# Patient Record
Sex: Female | Born: 1940 | Race: White | Hispanic: No | Smoking: Never smoker
Health system: Southern US, Community
[De-identification: ages and names within clinical notes are randomized; demographics above are authoritative.]

## PROBLEM LIST (undated history)

## (undated) DIAGNOSIS — K219 Gastro-esophageal reflux disease without esophagitis: Secondary | ICD-10-CM

## (undated) DIAGNOSIS — R739 Hyperglycemia, unspecified: Secondary | ICD-10-CM

## (undated) DIAGNOSIS — M810 Age-related osteoporosis without current pathological fracture: Secondary | ICD-10-CM

## (undated) DIAGNOSIS — M1990 Unspecified osteoarthritis, unspecified site: Secondary | ICD-10-CM

## (undated) DIAGNOSIS — I10 Essential (primary) hypertension: Secondary | ICD-10-CM

## (undated) DIAGNOSIS — R0600 Dyspnea, unspecified: Secondary | ICD-10-CM

## (undated) DIAGNOSIS — G4700 Insomnia, unspecified: Secondary | ICD-10-CM

## (undated) DIAGNOSIS — T7840XA Allergy, unspecified, initial encounter: Secondary | ICD-10-CM

## (undated) DIAGNOSIS — E876 Hypokalemia: Secondary | ICD-10-CM

## (undated) DIAGNOSIS — D649 Anemia, unspecified: Secondary | ICD-10-CM

## (undated) DIAGNOSIS — I493 Ventricular premature depolarization: Secondary | ICD-10-CM

## (undated) DIAGNOSIS — R011 Cardiac murmur, unspecified: Secondary | ICD-10-CM

## (undated) HISTORY — DX: Age-related osteoporosis without current pathological fracture: M81.0

## (undated) HISTORY — DX: Anemia, unspecified: D64.9

## (undated) HISTORY — DX: Ventricular premature depolarization: I49.3

## (undated) HISTORY — DX: Allergy, unspecified, initial encounter: T78.40XA

## (undated) HISTORY — DX: Hypomagnesemia: E83.42

## (undated) HISTORY — DX: Insomnia, unspecified: G47.00

## (undated) HISTORY — DX: Essential (primary) hypertension: I10

## (undated) HISTORY — DX: Cardiac murmur, unspecified: R01.1

## (undated) HISTORY — PX: BACK SURGERY: SHX140

## (undated) HISTORY — DX: Gastro-esophageal reflux disease without esophagitis: K21.9

## (undated) HISTORY — DX: Hypokalemia: E87.6

## (undated) HISTORY — DX: Hyperglycemia, unspecified: R73.9

## (undated) HISTORY — PX: EYE SURGERY: SHX253

---

## 2000-03-24 ENCOUNTER — Other Ambulatory Visit: Admission: RE | Admit: 2000-03-24 | Discharge: 2000-03-24 | Payer: Self-pay | Admitting: Family Medicine

## 2000-04-20 ENCOUNTER — Emergency Department (HOSPITAL_COMMUNITY): Admission: EM | Admit: 2000-04-20 | Discharge: 2000-04-20 | Payer: Self-pay | Admitting: Internal Medicine

## 2000-04-20 ENCOUNTER — Encounter: Payer: Self-pay | Admitting: Internal Medicine

## 2000-08-31 ENCOUNTER — Encounter: Payer: Self-pay | Admitting: *Deleted

## 2000-08-31 ENCOUNTER — Observation Stay (HOSPITAL_COMMUNITY): Admission: EM | Admit: 2000-08-31 | Discharge: 2000-09-01 | Payer: Self-pay | Admitting: Emergency Medicine

## 2000-09-01 ENCOUNTER — Encounter: Payer: Self-pay | Admitting: Internal Medicine

## 2000-12-02 ENCOUNTER — Ambulatory Visit (HOSPITAL_COMMUNITY): Admission: RE | Admit: 2000-12-02 | Discharge: 2000-12-02 | Payer: Self-pay | Admitting: Family Medicine

## 2000-12-02 ENCOUNTER — Encounter: Payer: Self-pay | Admitting: Family Medicine

## 2000-12-23 ENCOUNTER — Encounter: Admission: RE | Admit: 2000-12-23 | Discharge: 2000-12-23 | Payer: Self-pay | Admitting: Family Medicine

## 2000-12-23 ENCOUNTER — Encounter: Payer: Self-pay | Admitting: Family Medicine

## 2001-11-09 ENCOUNTER — Other Ambulatory Visit: Admission: RE | Admit: 2001-11-09 | Discharge: 2001-11-09 | Payer: Self-pay | Admitting: Obstetrics and Gynecology

## 2001-11-18 ENCOUNTER — Encounter: Payer: Self-pay | Admitting: Obstetrics and Gynecology

## 2001-11-18 ENCOUNTER — Ambulatory Visit (HOSPITAL_COMMUNITY): Admission: RE | Admit: 2001-11-18 | Discharge: 2001-11-18 | Payer: Self-pay | Admitting: Obstetrics and Gynecology

## 2002-09-17 ENCOUNTER — Ambulatory Visit (HOSPITAL_COMMUNITY): Admission: RE | Admit: 2002-09-17 | Discharge: 2002-09-17 | Payer: Self-pay | Admitting: Gastroenterology

## 2002-11-18 ENCOUNTER — Other Ambulatory Visit: Admission: RE | Admit: 2002-11-18 | Discharge: 2002-11-18 | Payer: Self-pay | Admitting: Obstetrics and Gynecology

## 2002-11-29 ENCOUNTER — Encounter: Payer: Self-pay | Admitting: Obstetrics and Gynecology

## 2002-11-29 ENCOUNTER — Ambulatory Visit (HOSPITAL_COMMUNITY): Admission: RE | Admit: 2002-11-29 | Discharge: 2002-11-29 | Payer: Self-pay | Admitting: Obstetrics and Gynecology

## 2003-12-12 ENCOUNTER — Ambulatory Visit (HOSPITAL_COMMUNITY): Admission: RE | Admit: 2003-12-12 | Discharge: 2003-12-12 | Payer: Self-pay | Admitting: Ophthalmology

## 2004-02-07 ENCOUNTER — Other Ambulatory Visit: Admission: RE | Admit: 2004-02-07 | Discharge: 2004-02-07 | Payer: Self-pay | Admitting: Obstetrics and Gynecology

## 2004-02-08 ENCOUNTER — Ambulatory Visit (HOSPITAL_COMMUNITY): Admission: RE | Admit: 2004-02-08 | Discharge: 2004-02-08 | Payer: Self-pay | Admitting: Obstetrics and Gynecology

## 2004-04-09 ENCOUNTER — Ambulatory Visit (HOSPITAL_COMMUNITY): Admission: RE | Admit: 2004-04-09 | Discharge: 2004-04-09 | Payer: Self-pay | Admitting: Specialist

## 2004-04-10 ENCOUNTER — Ambulatory Visit: Payer: Self-pay | Admitting: Family Medicine

## 2004-04-17 ENCOUNTER — Ambulatory Visit: Payer: Self-pay | Admitting: Family Medicine

## 2004-04-19 ENCOUNTER — Ambulatory Visit: Payer: Self-pay | Admitting: Family Medicine

## 2004-05-07 ENCOUNTER — Ambulatory Visit (HOSPITAL_COMMUNITY): Admission: RE | Admit: 2004-05-07 | Discharge: 2004-05-07 | Payer: Self-pay | Admitting: Specialist

## 2004-06-27 ENCOUNTER — Emergency Department (HOSPITAL_COMMUNITY): Admission: EM | Admit: 2004-06-27 | Discharge: 2004-06-28 | Payer: Self-pay | Admitting: Emergency Medicine

## 2005-02-07 ENCOUNTER — Other Ambulatory Visit: Admission: RE | Admit: 2005-02-07 | Discharge: 2005-02-07 | Payer: Self-pay | Admitting: *Deleted

## 2005-02-08 ENCOUNTER — Ambulatory Visit (HOSPITAL_COMMUNITY): Admission: RE | Admit: 2005-02-08 | Discharge: 2005-02-08 | Payer: Self-pay | Admitting: Family Medicine

## 2005-02-11 ENCOUNTER — Encounter: Payer: Self-pay | Admitting: Family Medicine

## 2005-02-11 LAB — CONVERTED CEMR LAB

## 2005-04-16 ENCOUNTER — Ambulatory Visit: Payer: Self-pay | Admitting: Family Medicine

## 2005-04-23 ENCOUNTER — Ambulatory Visit: Payer: Self-pay | Admitting: Family Medicine

## 2005-06-04 ENCOUNTER — Ambulatory Visit: Payer: Self-pay | Admitting: Family Medicine

## 2005-07-04 ENCOUNTER — Ambulatory Visit: Payer: Self-pay | Admitting: Family Medicine

## 2006-01-09 ENCOUNTER — Ambulatory Visit: Payer: Self-pay | Admitting: Family Medicine

## 2006-01-09 LAB — CONVERTED CEMR LAB: Uric Acid, Serum: 9.1 mg/dL — ABNORMAL HIGH (ref 2.4–7.0)

## 2006-01-14 ENCOUNTER — Ambulatory Visit: Payer: Self-pay | Admitting: Family Medicine

## 2006-03-05 ENCOUNTER — Ambulatory Visit (HOSPITAL_COMMUNITY): Admission: RE | Admit: 2006-03-05 | Discharge: 2006-03-05 | Payer: Self-pay | Admitting: Family Medicine

## 2006-03-18 ENCOUNTER — Other Ambulatory Visit: Admission: RE | Admit: 2006-03-18 | Discharge: 2006-03-18 | Payer: Self-pay | Admitting: Obstetrics and Gynecology

## 2006-04-07 ENCOUNTER — Ambulatory Visit: Payer: Self-pay | Admitting: Family Medicine

## 2006-04-25 ENCOUNTER — Ambulatory Visit: Payer: Self-pay | Admitting: Family Medicine

## 2006-04-25 LAB — CONVERTED CEMR LAB
ALT: 23 units/L (ref 0–40)
AST: 27 units/L (ref 0–37)
Albumin: 3.3 g/dL — ABNORMAL LOW (ref 3.5–5.2)
Alkaline Phosphatase: 44 units/L (ref 39–117)
BUN: 27 mg/dL — ABNORMAL HIGH (ref 6–23)
Basophils Absolute: 0 10*3/uL (ref 0.0–0.1)
Basophils Relative: 0 % (ref 0.0–1.0)
Bilirubin, Direct: 0.2 mg/dL (ref 0.0–0.3)
CO2: 34 meq/L — ABNORMAL HIGH (ref 19–32)
Calcium: 9.1 mg/dL (ref 8.4–10.5)
Chloride: 105 meq/L (ref 96–112)
Cholesterol: 223 mg/dL (ref 0–200)
Creatinine, Ser: 1.2 mg/dL (ref 0.4–1.2)
Direct LDL: 142.1 mg/dL
Eosinophils Absolute: 0.1 10*3/uL (ref 0.0–0.6)
Eosinophils Relative: 1.3 % (ref 0.0–5.0)
GFR calc Af Amer: 57 mL/min
GFR calc non Af Amer: 47 mL/min
Glucose, Bld: 104 mg/dL — ABNORMAL HIGH (ref 70–99)
HCT: 37.2 % (ref 36.0–46.0)
HDL: 45.2 mg/dL (ref >39.0)
Hemoglobin: 12.8 g/dL (ref 12.0–15.0)
Hgb A1c MFr Bld: 6.1 % — ABNORMAL HIGH (ref 4.6–6.0)
Lymphocytes Relative: 42.1 % (ref 12.0–46.0)
MCHC: 34.5 g/dL (ref 30.0–36.0)
MCV: 95.4 fL (ref 78.0–100.0)
Monocytes Absolute: 0.5 10*3/uL (ref 0.2–0.7)
Monocytes Relative: 8.4 % (ref 3.0–11.0)
Neutro Abs: 2.8 10*3/uL (ref 1.4–7.7)
Neutrophils Relative %: 48.2 % (ref 43.0–77.0)
Platelets: 230 10*3/uL (ref 150–400)
Potassium: 4.1 meq/L (ref 3.5–5.1)
RBC: 3.9 M/uL (ref 3.87–5.11)
RDW: 13 % (ref 11.5–14.6)
Sodium: 147 meq/L — ABNORMAL HIGH (ref 135–145)
TSH: 1.7 microintl units/mL (ref 0.35–5.50)
Total Bilirubin: 0.7 mg/dL (ref 0.3–1.2)
Total CHOL/HDL Ratio: 4.9
Total Protein: 6.4 g/dL (ref 6.0–8.3)
Triglycerides: 184 mg/dL — ABNORMAL HIGH (ref 0–149)
VLDL: 37 mg/dL (ref 0–40)
WBC: 5.8 10*3/uL (ref 4.5–10.5)

## 2006-06-30 ENCOUNTER — Ambulatory Visit: Payer: Self-pay | Admitting: Family Medicine

## 2006-07-11 ENCOUNTER — Encounter: Payer: Self-pay | Admitting: Family Medicine

## 2006-07-11 DIAGNOSIS — K219 Gastro-esophageal reflux disease without esophagitis: Secondary | ICD-10-CM | POA: Insufficient documentation

## 2006-07-11 DIAGNOSIS — M109 Gout, unspecified: Secondary | ICD-10-CM

## 2006-07-11 DIAGNOSIS — I10 Essential (primary) hypertension: Secondary | ICD-10-CM | POA: Insufficient documentation

## 2006-07-11 DIAGNOSIS — J309 Allergic rhinitis, unspecified: Secondary | ICD-10-CM | POA: Insufficient documentation

## 2006-12-16 ENCOUNTER — Ambulatory Visit (HOSPITAL_COMMUNITY): Admission: RE | Admit: 2006-12-16 | Discharge: 2006-12-16 | Payer: Self-pay | Admitting: Obstetrics and Gynecology

## 2007-01-02 ENCOUNTER — Ambulatory Visit: Payer: Self-pay | Admitting: Family Medicine

## 2007-02-27 ENCOUNTER — Telehealth (INDEPENDENT_AMBULATORY_CARE_PROVIDER_SITE_OTHER): Payer: Self-pay | Admitting: *Deleted

## 2007-03-09 ENCOUNTER — Other Ambulatory Visit: Admission: RE | Admit: 2007-03-09 | Discharge: 2007-03-09 | Payer: Self-pay | Admitting: Obstetrics and Gynecology

## 2007-03-16 ENCOUNTER — Ambulatory Visit (HOSPITAL_COMMUNITY): Admission: RE | Admit: 2007-03-16 | Discharge: 2007-03-16 | Payer: Self-pay | Admitting: Family Medicine

## 2007-03-24 ENCOUNTER — Ambulatory Visit: Payer: Self-pay | Admitting: Family Medicine

## 2007-03-24 DIAGNOSIS — M5030 Other cervical disc degeneration, unspecified cervical region: Secondary | ICD-10-CM

## 2007-03-31 ENCOUNTER — Ambulatory Visit: Payer: Self-pay | Admitting: Family Medicine

## 2007-04-30 ENCOUNTER — Ambulatory Visit: Payer: Self-pay | Admitting: Family Medicine

## 2007-06-24 ENCOUNTER — Ambulatory Visit: Payer: Self-pay | Admitting: Family Medicine

## 2007-06-24 LAB — CONVERTED CEMR LAB
ALT: 28 units/L (ref 0–35)
AST: 27 units/L (ref 0–37)
Albumin: 3.8 g/dL (ref 3.5–5.2)
Alkaline Phosphatase: 50 units/L (ref 39–117)
BUN: 21 mg/dL (ref 6–23)
Basophils Absolute: 0 10*3/uL (ref 0.0–0.1)
Basophils Relative: 0.9 % (ref 0.0–1.0)
Bilirubin Urine: NEGATIVE
Bilirubin, Direct: 0.1 mg/dL (ref 0.0–0.3)
Blood in Urine, dipstick: NEGATIVE
CO2: 32 meq/L (ref 19–32)
Calcium: 9.4 mg/dL (ref 8.4–10.5)
Chloride: 99 meq/L (ref 96–112)
Cholesterol: 190 mg/dL (ref 0–200)
Creatinine, Ser: 1 mg/dL (ref 0.4–1.2)
Eosinophils Absolute: 0.1 10*3/uL (ref 0.0–0.7)
Eosinophils Relative: 1.4 % (ref 0.0–5.0)
GFR calc Af Amer: 71 mL/min
GFR calc non Af Amer: 58 mL/min
Glucose, Bld: 115 mg/dL — ABNORMAL HIGH (ref 70–99)
Glucose, Urine, Semiquant: NEGATIVE
HCT: 38.2 % (ref 36.0–46.0)
HDL: 36.7 mg/dL — ABNORMAL LOW (ref >39.0)
Hemoglobin: 12.9 g/dL (ref 12.0–15.0)
Ketones, urine, test strip: NEGATIVE
LDL Cholesterol: 128 mg/dL — ABNORMAL HIGH (ref 0–99)
Lymphocytes Relative: 46.5 % — ABNORMAL HIGH (ref 12.0–46.0)
MCHC: 33.6 g/dL (ref 30.0–36.0)
MCV: 97.2 fL (ref 78.0–100.0)
Monocytes Absolute: 0.4 10*3/uL (ref 0.1–1.0)
Monocytes Relative: 9.6 % (ref 3.0–12.0)
Neutro Abs: 1.9 10*3/uL (ref 1.4–7.7)
Neutrophils Relative %: 41.6 % — ABNORMAL LOW (ref 43.0–77.0)
Nitrite: NEGATIVE
Platelets: 308 10*3/uL (ref 150–400)
Potassium: 3.1 meq/L — ABNORMAL LOW (ref 3.5–5.1)
Protein, U semiquant: NEGATIVE
RBC: 3.93 M/uL (ref 3.87–5.11)
RDW: 12.8 % (ref 11.5–14.6)
Sodium: 142 meq/L (ref 135–145)
Specific Gravity, Urine: 1.015
TSH: 2.43 microintl units/mL (ref 0.35–5.50)
Total Bilirubin: 0.8 mg/dL (ref 0.3–1.2)
Total CHOL/HDL Ratio: 5.2
Total Protein: 6.8 g/dL (ref 6.0–8.3)
Triglycerides: 129 mg/dL (ref 0–149)
Urobilinogen, UA: 0.2
VLDL: 26 mg/dL (ref 0–40)
WBC Urine, dipstick: NEGATIVE
WBC: 4.5 10*3/uL (ref 4.5–10.5)
pH: 7

## 2007-07-03 ENCOUNTER — Encounter: Admission: RE | Admit: 2007-07-03 | Discharge: 2007-07-03 | Payer: Self-pay | Admitting: Family Medicine

## 2008-10-10 ENCOUNTER — Ambulatory Visit (HOSPITAL_COMMUNITY): Admission: RE | Admit: 2008-10-10 | Discharge: 2008-10-10 | Payer: Self-pay | Admitting: Family Medicine

## 2008-12-30 ENCOUNTER — Ambulatory Visit: Payer: Self-pay | Admitting: Family Medicine

## 2009-01-03 ENCOUNTER — Telehealth: Payer: Self-pay | Admitting: Family Medicine

## 2009-02-09 ENCOUNTER — Ambulatory Visit: Payer: Self-pay | Admitting: Family Medicine

## 2009-02-09 LAB — CONVERTED CEMR LAB
ALT: 21 units/L (ref 0–35)
AST: 24 units/L (ref 0–37)
Albumin: 3.8 g/dL (ref 3.5–5.2)
Alkaline Phosphatase: 47 units/L (ref 39–117)
BUN: 24 mg/dL — ABNORMAL HIGH (ref 6–23)
Basophils Absolute: 0 10*3/uL (ref 0.0–0.1)
Basophils Relative: 0.7 % (ref 0.0–3.0)
Bilirubin Urine: NEGATIVE
Bilirubin, Direct: 0 mg/dL (ref 0.0–0.3)
Blood in Urine, dipstick: NEGATIVE
CO2: 31 meq/L (ref 19–32)
Calcium: 9.5 mg/dL (ref 8.4–10.5)
Chloride: 103 meq/L (ref 96–112)
Cholesterol: 198 mg/dL (ref 0–200)
Creatinine, Ser: 1 mg/dL (ref 0.4–1.2)
Eosinophils Absolute: 0.1 10*3/uL (ref 0.0–0.7)
Eosinophils Relative: 2 % (ref 0.0–5.0)
GFR calc non Af Amer: 58.12 mL/min (ref >60)
Glucose, Bld: 110 mg/dL — ABNORMAL HIGH (ref 70–99)
Glucose, Urine, Semiquant: NEGATIVE
HCT: 40.1 % (ref 36.0–46.0)
HDL: 42.4 mg/dL (ref >39.00)
Hemoglobin: 13.6 g/dL (ref 12.0–15.0)
Ketones, urine, test strip: NEGATIVE
LDL Cholesterol: 124 mg/dL — ABNORMAL HIGH (ref 0–99)
Lymphocytes Relative: 38.5 % (ref 12.0–46.0)
Lymphs Abs: 2 10*3/uL (ref 0.7–4.0)
MCHC: 33.9 g/dL (ref 30.0–36.0)
MCV: 97 fL (ref 78.0–100.0)
Monocytes Absolute: 0.4 10*3/uL (ref 0.1–1.0)
Monocytes Relative: 8.1 % (ref 3.0–12.0)
Neutro Abs: 2.7 10*3/uL (ref 1.4–7.7)
Neutrophils Relative %: 50.7 % (ref 43.0–77.0)
Nitrite: NEGATIVE
Platelets: 245 10*3/uL (ref 150.0–400.0)
Potassium: 3.7 meq/L (ref 3.5–5.1)
Protein, U semiquant: NEGATIVE
RBC: 4.14 M/uL (ref 3.87–5.11)
RDW: 12.5 % (ref 11.5–14.6)
Sodium: 144 meq/L (ref 135–145)
Specific Gravity, Urine: 1.015
TSH: 2.47 microintl units/mL (ref 0.35–5.50)
Total Bilirubin: 0.9 mg/dL (ref 0.3–1.2)
Total CHOL/HDL Ratio: 5
Total Protein: 6.9 g/dL (ref 6.0–8.3)
Triglycerides: 156 mg/dL — ABNORMAL HIGH (ref 0.0–149.0)
Urobilinogen, UA: 0.2
VLDL: 31.2 mg/dL (ref 0.0–40.0)
WBC: 5.2 10*3/uL (ref 4.5–10.5)
pH: 6

## 2009-02-16 ENCOUNTER — Other Ambulatory Visit: Admission: RE | Admit: 2009-02-16 | Discharge: 2009-02-16 | Payer: Self-pay | Admitting: Family Medicine

## 2009-02-16 ENCOUNTER — Ambulatory Visit: Payer: Self-pay | Admitting: Family Medicine

## 2009-02-16 DIAGNOSIS — G4700 Insomnia, unspecified: Secondary | ICD-10-CM

## 2009-02-16 LAB — HM PAP SMEAR

## 2009-12-01 ENCOUNTER — Ambulatory Visit (HOSPITAL_COMMUNITY): Admission: RE | Admit: 2009-12-01 | Discharge: 2009-12-01 | Payer: Self-pay | Admitting: Family Medicine

## 2009-12-01 LAB — HM MAMMOGRAPHY

## 2010-03-15 NOTE — Assessment & Plan Note (Signed)
Summary: CPX//LH   Vital Signs:  Patient profile:   70 year old female Height:      60 inches Weight:      132 pounds BMI:     25.87 Temp:     98.8 degrees F oral BP sitting:   124 / 80  (left arm) Cuff size:   regular  Vitals Entered By: Kern Reap CMA Duncan Dull) (February 16, 2009 2:46 PM)  Reason for Visit cpx  History of Present Illness: Jennifer Green is a 70 year old female, married, nonsmoker, who comes in today for physical examination  She has a history of underlying hypertension and is on Tenoretic 50 -- 25 daily, Norvasc, 10 mg daily with good blood pressure control.  BP 124/80.  She takes trazodone 50 mg nightly for sleep dysfunction.  She also takes aspirin, calcium, and vitamin D.  She is on Lumigan eyedrops via her ophthalmologist for glaucoma.  She's been off her allopurinol for 3 years and has had no recurrence of gout symptoms.  She gets routine eye care as noted above, dental care, does BSE monthly, annual mammography, colonoscopy, normal, and she.  Tetanus 2004, seasonal flu 2010, Pneumovax 2004, and booster today.  Allergies: 1)  ! Indocin 2)  ! Phenergan  Past History:  Past medical, surgical, family and social histories (including risk factors) reviewed, and no changes noted (except as noted below).  Past Medical History: Hypertension OP GERD INCRESAED BS  790.21 Gout MURMUR INSOMNIA Allergic rhinitis glaucoma  Past Surgical History: Reviewed history from 07/11/2006 and no changes required. R EYEX2 (CAT) L EYE X1  "  Family History: Reviewed history from 03/24/2007 and no changes required. father died at 46, MI, smoker mother died 11, pneumonia, hypertension  Social History: Reviewed history from 03/24/2007 and no changes required. Occupation:business Production designer, theatre/television/film for [employer] Married Never Smoked Alcohol use-no Drug use-no Regular exercise-yes  Review of Systems      See HPI       Flu Vaccine Consent Questions     Do  you have a history of severe allergic reactions to this vaccine? no    Any prior history of allergic reactions to egg and/or gelatin? no    Do you have a sensitivity to the preservative Thimersol? no    Do you have a past history of Guillan-Barre Syndrome? no    Do you currently have an acute febrile illness? no    Have you ever had a severe reaction to latex? no    Vaccine information given and explained to patient? yes    Are you currently pregnant? no    Lot Number:[id number]   Exp Date:08/10/2009   Site Given  Left Deltoid IM   Physical Exam  General:  Well-developed,well-nourished,in no acute distress; alert,appropriate and cooperative throughout examination Head:  Normocephalic and atraumatic without obvious abnormalities. No apparent alopecia or balding. Eyes:  No corneal or conjunctival inflammation noted. EOMI. Perrla. Funduscopic exam benign, without hemorrhages, exudates or papilledema. Vision grossly normal. Ears:  External ear exam shows no significant lesions or deformities.  Otoscopic examination reveals clear canals, tympanic membranes are intact bilaterally without bulging, retraction, inflammation or discharge. Hearing is grossly normal bilaterally. Nose:  External nasal examination shows no deformity or inflammation. Nasal mucosa are pink and moist without lesions or exudates. Mouth:  Oral mucosa and oropharynx without lesions or exudates.  Teeth in good repair. Neck:  No deformities, masses, or tenderness noted. Chest Wall:  No deformities, masses, or tenderness noted. Breasts:  No mass, nodules, thickening, tenderness, bulging, retraction, inflamation, nipple discharge or skin changes noted.   Lungs:  Normal respiratory effort, chest expands symmetrically. Lungs are clear to auscultation, no crackles or wheezes. Heart:  Normal rate and regular rhythm. S1 and S2 normal without gallop, murmur, click, rub or other extra sounds. Abdomen:  Bowel sounds positive,abdomen soft  and non-tender without masses, organomegaly or hernias noted. Rectal:  No external abnormalities noted. Normal sphincter tone. No rectal masses or tenderness. Genitalia:  Pelvic Exam:        External: normal female genitalia without lesions or masses        Vagina: normal without lesions or masses        Cervix: normal without lesions or masses        Adnexa: normal bimanual exam without masses or fullness        Uterus: normal by palpation        Pap smear: performed Msk:  No deformity or scoliosis noted of thoracic or lumbar spine.   Pulses:  R and L carotid,radial,femoral,dorsalis pedis and posterior tibial pulses are full and equal bilaterally Extremities:  No clubbing, cyanosis, edema, or deformity noted with normal full range of motion of all joints.   Neurologic:  No cranial nerve deficits noted. Station and gait are normal. Plantar reflexes are down-going bilaterally. DTRs are symmetrical throughout. Sensory, motor and coordinative functions appear intact. Skin:  total body skin exam normal.  She has numerous seborrheic keratoses.  She has two subcutaneous seborrheic keratoses in her left labia. Cervical Nodes:  No lymphadenopathy noted Axillary Nodes:  No palpable lymphadenopathy Inguinal Nodes:  No significant adenopathy Psych:  Cognition and judgment appear intact. Alert and cooperative with normal attention span and concentration. No apparent delusions, illusions, hallucinations   Impression & Recommendations:  Problem # 1:  HYPERTENSION (ICD-401.9) Assessment Improved  Her updated medication list for this problem includes:    Tenoretic 50 50-25 Mg Tabs (Atenolol-chlorthalidone) .[name]... 1 tablet by mouth once a day    Norvasc 10 Mg Tabs (Amlodipine besylate) .[name]... Take 1 tablet by mouth every morning  Orders: EKG w/ Interpretation (93000)  Problem # 2:  INSOMNIA (ICD-780.52) Assessment: Improved  Complete Medication List: 1)  Tenoretic 50 50-25 Mg Tabs  (Atenolol-chlorthalidone) .[name].. 1 tablet by mouth once a day 2)  Trazodone Hcl 50 Mg Tabs (Trazodone hcl) .[name].. 1 by mouth once daily 3)  Norvasc 10 Mg Tabs (Amlodipine besylate) .... Take 1 tablet by mouth every morning 4)  Adult Aspirin Ec Low Strength 81 Mg Tbec (Aspirin) .... Once daily 5)  Calcium Antacid Ultra 1000 Mg Chew (Calcium carbonate antacid) .... Once daily 6)  Multivitamins Tabs (Multiple vitamin) .... Once daily 7)  Dorzolamide-timolol 2-0.5 % Soln (Dorzolamide-timolol) .... Use as directed 8)  Lumigan 0.03 % Soln (Bimatoprost) .... Use as directed 9)  Premarin 0.625 Mg/gm Crea (Estrogens, conjugated) .... Uad  Other Orders: Pneumococcal Vaccine (94854) Admin 1st Vaccine (62703)  Patient Instructions: 1)  Please schedule a follow-up appointment in 1 year. 2)  It is important that you exercise regularly at least 20 minutes 5 times a week. If you develop chest pain, have severe difficulty breathing, or feel very tired , stop exercising immediately and seek medical attention. 3)  Schedule your mammogram. 4)  Schedule a colonoscopy/sigmoidoscopy to help detect colon cancer. 5)  Take calcium +Vitamin D daily. 6)  Take an Aspirin every day. Prescriptions: NORVASC 10 MG  TABS (AMLODIPINE BESYLATE) Take 1 tablet by mouth  every morning  #100 x 4   Entered and Authorized by:   Roderick Pee MD   Signed by:   Roderick Pee MD on 02/16/2009   Method used:   Electronically to        [facility] [phone]* (retail)       [address]       [place], [place]  [address]       Ph: [phone] or [phone]       Fax: [phone]   RxID:   [id number] TRAZODONE HCL 50 MG TABS (TRAZODONE HCL) 1 by mouth once daily  #100 x 3   Entered and Authorized by:   Roderick Pee MD   Signed by:   Roderick Pee MD on 02/16/2009   Method used:   Electronically to        [facility] [phone]* (retail)       [address]       [place], [place]  [address]       Ph: [phone] or [phone]        Fax: [phone]   RxID:   [id number] TENORETIC 50 50-25 MG TABS (ATENOLOL-CHLORTHALIDONE) 1 tablet by mouth once a day  #100 x 3   Entered and Authorized by:   Roderick Pee MD   Signed by:   Roderick Pee MD on 02/16/2009   Method used:   Electronically to        [facility] [phone]* (retail)       [address]       [place], [place]  [address]       Ph: [phone] or [phone]       Fax: [phone]   RxID:   [id number] PREMARIN 0.625 MG/GM CREA (ESTROGENS, CONJUGATED) UAD  #3 tubes x 4   Entered and Authorized by:   Roderick Pee MD   Signed by:   Roderick Pee MD on 02/16/2009   Method used:   Electronically to        [facility] [phone]* (retail)       [address]       [place], [place]  [address]       Ph: [phone] or [phone]       Fax: [phone]   RxID:   [id number]    Immunizations Administered:  Pneumonia Vaccine:    Vaccine Type: Pneumovax    Site: right deltoid    Mfr: Merck    Dose: 0.5 ml    Route: IM    Given by: Kern Reap CMA (AAMA)    Exp. Date: 03/09/2010    Lot #: [id number]    Physician counseled: yes

## 2010-03-16 ENCOUNTER — Encounter: Payer: Self-pay | Admitting: Family Medicine

## 2010-03-16 ENCOUNTER — Ambulatory Visit (INDEPENDENT_AMBULATORY_CARE_PROVIDER_SITE_OTHER): Payer: Medicare Other | Admitting: Family Medicine

## 2010-03-16 VITALS — BP 120/80 | Temp 98.1°F | Ht 59.5 in | Wt 133.0 lb

## 2010-03-16 DIAGNOSIS — H9190 Unspecified hearing loss, unspecified ear: Secondary | ICD-10-CM

## 2010-03-16 DIAGNOSIS — R42 Dizziness and giddiness: Secondary | ICD-10-CM

## 2010-03-16 DIAGNOSIS — H9191 Unspecified hearing loss, right ear: Secondary | ICD-10-CM

## 2010-03-16 MED ORDER — DIAZEPAM 2 MG PO TABS: 2.0000 mg | ORAL_TABLET | Freq: Three times a day (TID) | ORAL | Status: DC | PRN

## 2010-03-16 NOTE — Patient Instructions (Signed)
Take Valium 2 mg 3 times a day.  Stay at bed rest today and tomorrow remember.  No television.  I would recommend you reconsult with terry ,,,,,,,,,He is the audiologist  at [facility] in nose, and throat
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
## 2010-04-10 ENCOUNTER — Other Ambulatory Visit: Payer: Self-pay | Admitting: Neurosurgery

## 2010-04-10 DIAGNOSIS — M542 Cervicalgia: Secondary | ICD-10-CM

## 2010-04-11 ENCOUNTER — Ambulatory Visit (INDEPENDENT_AMBULATORY_CARE_PROVIDER_SITE_OTHER): Payer: Medicare Other | Admitting: Family Medicine

## 2010-04-11 ENCOUNTER — Encounter: Payer: Self-pay | Admitting: Family Medicine

## 2010-04-11 ENCOUNTER — Other Ambulatory Visit (HOSPITAL_COMMUNITY)
Admission: RE | Admit: 2010-04-11 | Discharge: 2010-04-11 | Disposition: A | Discharge status: Home / Self Care | Payer: Medicare Other | Source: Ambulatory Visit | Attending: Family Medicine | Admitting: Family Medicine

## 2010-04-11 DIAGNOSIS — M109 Gout, unspecified: Secondary | ICD-10-CM

## 2010-04-11 DIAGNOSIS — Z79899 Other long term (current) drug therapy: Secondary | ICD-10-CM

## 2010-04-11 DIAGNOSIS — K219 Gastro-esophageal reflux disease without esophagitis: Secondary | ICD-10-CM

## 2010-04-11 DIAGNOSIS — Z124 Encounter for screening for malignant neoplasm of cervix: Secondary | ICD-10-CM | POA: Insufficient documentation

## 2010-04-11 DIAGNOSIS — Z0000 Encounter for general adult medical examination without abnormal findings: Secondary | ICD-10-CM

## 2010-04-11 DIAGNOSIS — I10 Essential (primary) hypertension: Secondary | ICD-10-CM

## 2010-04-11 DIAGNOSIS — R5383 Other fatigue: Secondary | ICD-10-CM

## 2010-04-11 DIAGNOSIS — E785 Hyperlipidemia, unspecified: Secondary | ICD-10-CM

## 2010-04-11 DIAGNOSIS — G479 Sleep disorder, unspecified: Secondary | ICD-10-CM

## 2010-04-11 DIAGNOSIS — R5381 Other malaise: Secondary | ICD-10-CM

## 2010-04-11 LAB — CBC WITH DIFFERENTIAL/PLATELET
Basophils Absolute: 0 10*3/uL (ref 0.0–0.1)
Basophils Relative: 0.3 % (ref 0.0–3.0)
Eosinophils Absolute: 0.1 10*3/uL (ref 0.0–0.7)
Eosinophils Relative: 0.9 % (ref 0.0–5.0)
Lymphocytes Relative: 25 % (ref 12.0–46.0)
Lymphs Abs: 2.2 10*3/uL (ref 0.7–4.0)
MCHC: 34.5 g/dL (ref 30.0–36.0)
MCV: 96.2 fl (ref 78.0–100.0)
Neutro Abs: 5.9 10*3/uL (ref 1.4–7.7)
Neutrophils Relative %: 66.4 % (ref 43.0–77.0)
RBC: 3.65 Mil/uL — ABNORMAL LOW (ref 3.87–5.11)
RDW: 13.4 % (ref 11.5–14.6)
WBC: 8.9 10*3/uL (ref 4.5–10.5)

## 2010-04-11 LAB — POCT URINALYSIS DIPSTICK
Ketones, UA: NEGATIVE
Nitrite, UA: NEGATIVE
Protein, UA: NEGATIVE
Urobilinogen, UA: 0.2
pH, UA: 6.5

## 2010-04-11 LAB — LIPID PANEL
Cholesterol: 177 mg/dL (ref 0–200)
HDL: 49.2 mg/dL (ref >39.00)
Total CHOL/HDL Ratio: 4
Triglycerides: 109 mg/dL (ref 0.0–149.0)
VLDL: 21.8 mg/dL (ref 0.0–40.0)

## 2010-04-11 LAB — BASIC METABOLIC PANEL
BUN: 22 mg/dL (ref 6–23)
CO2: 31 mEq/L (ref 19–32)
Calcium: 9.3 mg/dL (ref 8.4–10.5)
Chloride: 99 mEq/L (ref 96–112)
Creatinine, Ser: 0.9 mg/dL (ref 0.4–1.2)
GFR: 64.59 mL/min (ref >60.00)
Glucose, Bld: 105 mg/dL — ABNORMAL HIGH (ref 70–99)
Sodium: 141 mEq/L (ref 135–145)

## 2010-04-11 LAB — HEPATIC FUNCTION PANEL
AST: 20 U/L (ref 0–37)
Albumin: 3.8 g/dL (ref 3.5–5.2)
Alkaline Phosphatase: 54 U/L (ref 39–117)
Bilirubin, Direct: 0.1 mg/dL (ref 0.0–0.3)

## 2010-04-11 LAB — URIC ACID: Uric Acid, Serum: 10 mg/dL — ABNORMAL HIGH (ref 2.4–7.0)

## 2010-04-11 MED ORDER — AMLODIPINE BESYLATE 10 MG PO TABS: 10.0000 mg | ORAL_TABLET | Freq: Every day | ORAL | Status: DC

## 2010-04-11 MED ORDER — PREDNISONE 20 MG PO TABS: ORAL_TABLET | ORAL | Status: DC

## 2010-04-11 MED ORDER — TRAZODONE HCL 50 MG PO TABS: 50.0000 mg | ORAL_TABLET | Freq: Every day | ORAL | Status: DC

## 2010-04-11 MED ORDER — ALLOPURINOL 300 MG PO TABS: 300.0000 mg | ORAL_TABLET | Freq: Every day | ORAL | Status: DC

## 2010-04-11 MED ORDER — ATENOLOL-CHLORTHALIDONE 50-25 MG PO TABS: ORAL_TABLET | ORAL | Status: DC

## 2010-04-11 NOTE — Progress Notes (Signed)
  Subjective:    Patient ID: Jennifer Green, female    DOB: [birth year], 71 y.o.   MRN: [id number]  HPIShirley Is a 70 year old female, who comes in today for evaluation of multiple issues.  She has a history of hypertension, for which he takes Norvasc, 10 mg daily, and Tenoretic 50 -- 25 daily.  BP 110/72, and she feels fatigued.  She also takes trazodone 50 mg nightly for sleep dysfunction.  She is on eyedrops because of glaucoma.  Dr. Randon Goldsmith her ophthalmologist.  A new problem is pain and swelling of her right foot for 6 days.  She's had a history of gout in the past, although the episodes have been minor and went away rather quickly.  This episode is severe and is not going away.  She also states she has a history of cervical disk disease and is currently seeing a neurosurgeon.   Review of Systems  Constitutional: Negative.   HENT: Negative.   Eyes: Negative.   Respiratory: Negative.   Cardiovascular: Negative.   Gastrointestinal: Negative.   Genitourinary: Negative.   Musculoskeletal: Negative.   Neurological: Negative.   Hematological: Negative.   Psychiatric/Behavioral: Negative.        Objective:   Physical Exam  Constitutional: She appears well-developed and well-nourished.  HENT:  Head: Normocephalic and atraumatic.  Right Ear: External ear normal.  Left Ear: External ear normal.  Nose: Nose normal.  Mouth/Throat: Oropharynx is clear and moist.  Eyes: EOM are normal. Pupils are equal, round, and reactive to light.  Neck: Normal range of motion. Neck supple. No thyromegaly present.  Cardiovascular: Normal rate, regular rhythm, normal heart sounds and intact distal pulses.  Exam reveals no gallop and no friction rub.   No murmur heard. Pulmonary/Chest: Effort normal and breath sounds normal.  Abdominal: Soft. Bowel sounds are normal. She exhibits no distension and no mass. There is no tenderness. There is no rebound.  Genitourinary: Vagina normal and uterus normal.  Guaiac negative stool. No vaginal discharge found.       Bilateral breast exam normal.  Left nipple inverted chronic  Musculoskeletal: Normal range of motion.  Lymphadenopathy:    She has no cervical adenopathy.  Neurological: She is alert. She has normal reflexes. No cranial nerve deficit. She exhibits normal muscle tone. Coordination normal.  Skin: Skin is warm and dry.  Psychiatric: She has a normal mood and affect. Her behavior is normal. Judgment and thought content normal.          Assessment & Plan:  Hypertension,,,,,,,, decreased the Tenoretic to a half a tablet daily, continue the Norvasc, 10 mg daily BP check.  Daily follow-up in two weeks.  Sleep dysfunction.  Continue trazodone nightly  Acute gout,,,,,, prednisone burst and taper, allopurinol 300 mg daily.  History of glaucoma.  Continue follow-up ophthalmologist

## 2010-04-11 NOTE — Patient Instructions (Signed)
Beginning the prednisone as directed on the bottle.  Start allopurinol 300 mg daily.  Return in two weeks for follow-up.  Continue other medications except decrease the Tenoretic to a half a tablet daily

## 2010-04-13 ENCOUNTER — Ambulatory Visit
Admission: RE | Admit: 2010-04-13 | Discharge: 2010-04-13 | Disposition: A | Discharge status: Home / Self Care | Payer: Medicare Other | Source: Ambulatory Visit | Attending: Neurosurgery | Admitting: Neurosurgery

## 2010-04-13 DIAGNOSIS — M542 Cervicalgia: Secondary | ICD-10-CM

## 2010-04-26 ENCOUNTER — Ambulatory Visit (INDEPENDENT_AMBULATORY_CARE_PROVIDER_SITE_OTHER): Payer: Medicare Other | Admitting: Family Medicine

## 2010-04-26 ENCOUNTER — Encounter: Payer: Self-pay | Admitting: Family Medicine

## 2010-04-26 DIAGNOSIS — I959 Hypotension, unspecified: Secondary | ICD-10-CM

## 2010-04-26 DIAGNOSIS — I10 Essential (primary) hypertension: Secondary | ICD-10-CM

## 2010-04-26 NOTE — Patient Instructions (Signed)
Continue the Tenoretic, at one half tablet q.a.m.  Decrease the Norvasc to 5 mg daily.  Purchase a new digital blood pressure cuff,,,,,,,,, checked her blood pressure daily,,,,,,,, goal 135/85

## 2010-04-26 NOTE — Progress Notes (Signed)
  Subjective:    Patient ID: Jennifer Green, female    DOB: 03-18-1940, 70 y.o.   MRN: [id number]  HPI Jennifer Green is a delightful, 70 year old, married female, nonsmoker, who comes in today for reevaluation of hypertension.  We cut her Tenoretic in half.  She is currently taking 25 mg daily.  BP 120/80.  Also Norvasc 10 daily.  The symptoms of lightheadedness that she had are gone.  Her home cuff is reading 140 to 150 systolic is 70 years old.  I suspect she is getting an accurate data   Review of Systems    Due to have cervical disk surgery April 4 Objective:   Physical Exam    Well-developed well-nourished, female, in no acute distress.  BP 120/80 right arm sitting position pulse 60 and regular    Assessment & Plan:  Hypotension...........  Continue Tenoretic one half tab daily, and now decrease the Norvasc to 5 daily.    Purchase a new digital pump up blood pressure cuff, BP daily.  Follow-up in 4 weeks

## 2010-05-10 ENCOUNTER — Encounter (HOSPITAL_COMMUNITY)
Admission: RE | Admit: 2010-05-10 | Discharge: 2010-05-10 | Disposition: A | Discharge status: Home / Self Care | Payer: Medicare Other | Source: Ambulatory Visit | Attending: Neurosurgery | Admitting: Neurosurgery

## 2010-05-10 ENCOUNTER — Other Ambulatory Visit (HOSPITAL_COMMUNITY): Payer: Self-pay | Admitting: Neurosurgery

## 2010-05-10 DIAGNOSIS — Z01811 Encounter for preprocedural respiratory examination: Secondary | ICD-10-CM

## 2010-05-10 LAB — CBC
HCT: 40.9 % (ref 36.0–46.0)
Hemoglobin: 13.8 g/dL (ref 12.0–15.0)
MCH: 31.4 pg (ref 26.0–34.0)
MCHC: 33.7 g/dL (ref 30.0–36.0)

## 2010-05-10 LAB — BASIC METABOLIC PANEL
CO2: 31 mEq/L (ref 19–32)
Calcium: 9.9 mg/dL (ref 8.4–10.5)
Creatinine, Ser: 0.94 mg/dL (ref 0.4–1.2)
GFR calc Af Amer: >60 mL/min (ref >60)
Glucose, Bld: 100 mg/dL — ABNORMAL HIGH (ref 70–99)

## 2010-05-13 HISTORY — PX: CERVICAL FUSION: SHX112

## 2010-05-16 ENCOUNTER — Inpatient Hospital Stay (HOSPITAL_COMMUNITY)
Admission: RE | Admit: 2010-05-16 | Discharge: 2010-05-17 | DRG: 473 | Disposition: A | Discharge status: Home / Self Care | Payer: Medicare Other | Source: Ambulatory Visit | Attending: Neurosurgery | Admitting: Neurosurgery

## 2010-05-16 ENCOUNTER — Inpatient Hospital Stay (HOSPITAL_COMMUNITY): Payer: Medicare Other

## 2010-05-16 DIAGNOSIS — I10 Essential (primary) hypertension: Secondary | ICD-10-CM | POA: Diagnosis present

## 2010-05-16 DIAGNOSIS — Z01818 Encounter for other preprocedural examination: Secondary | ICD-10-CM

## 2010-05-16 DIAGNOSIS — M4712 Other spondylosis with myelopathy, cervical region: Principal | ICD-10-CM | POA: Diagnosis present

## 2010-05-16 DIAGNOSIS — M109 Gout, unspecified: Secondary | ICD-10-CM | POA: Diagnosis present

## 2010-05-16 DIAGNOSIS — Z01810 Encounter for preprocedural cardiovascular examination: Secondary | ICD-10-CM

## 2010-05-18 ENCOUNTER — Encounter (HOSPITAL_COMMUNITY): Payer: Self-pay | Admitting: Radiology

## 2010-05-18 ENCOUNTER — Emergency Department (HOSPITAL_COMMUNITY): Payer: Medicare Other

## 2010-05-18 ENCOUNTER — Emergency Department (HOSPITAL_COMMUNITY)
Admission: EM | Admit: 2010-05-18 | Discharge: 2010-05-18 | Disposition: A | Discharge status: Home / Self Care | Payer: Medicare Other | Attending: Emergency Medicine | Admitting: Emergency Medicine

## 2010-05-18 DIAGNOSIS — I10 Essential (primary) hypertension: Secondary | ICD-10-CM | POA: Insufficient documentation

## 2010-05-18 DIAGNOSIS — R0602 Shortness of breath: Secondary | ICD-10-CM | POA: Insufficient documentation

## 2010-05-18 DIAGNOSIS — M129 Arthropathy, unspecified: Secondary | ICD-10-CM | POA: Insufficient documentation

## 2010-05-18 DIAGNOSIS — Z79899 Other long term (current) drug therapy: Secondary | ICD-10-CM | POA: Insufficient documentation

## 2010-05-18 DIAGNOSIS — R1310 Dysphagia, unspecified: Secondary | ICD-10-CM | POA: Insufficient documentation

## 2010-05-18 DIAGNOSIS — G8918 Other acute postprocedural pain: Secondary | ICD-10-CM | POA: Insufficient documentation

## 2010-05-18 DIAGNOSIS — Z862 Personal history of diseases of the blood and blood-forming organs and certain disorders involving the immune mechanism: Secondary | ICD-10-CM | POA: Insufficient documentation

## 2010-05-18 DIAGNOSIS — Z8639 Personal history of other endocrine, nutritional and metabolic disease: Secondary | ICD-10-CM | POA: Insufficient documentation

## 2010-05-18 LAB — POCT I-STAT, CHEM 8
BUN: 13 mg/dL (ref 6–23)
Calcium, Ion: 0.96 mmol/L — ABNORMAL LOW (ref 1.12–1.32)
Creatinine, Ser: 1 mg/dL (ref 0.4–1.2)
Glucose, Bld: 162 mg/dL — ABNORMAL HIGH (ref 70–99)
TCO2: 31 mmol/L (ref 0–100)

## 2010-05-18 LAB — DIFFERENTIAL
Basophils Relative: 0 % (ref 0–1)
Eosinophils Absolute: 0 10*3/uL (ref 0.0–0.7)
Monocytes Absolute: 1 10*3/uL (ref 0.1–1.0)
Monocytes Relative: 7 % (ref 3–12)

## 2010-05-18 LAB — CBC
Hemoglobin: 11.9 g/dL — ABNORMAL LOW (ref 12.0–15.0)
MCH: 31.9 pg (ref 26.0–34.0)
MCHC: 34.4 g/dL (ref 30.0–36.0)
Platelets: 272 10*3/uL (ref 150–400)

## 2010-05-18 LAB — URINALYSIS, ROUTINE W REFLEX MICROSCOPIC
Bilirubin Urine: NEGATIVE
Glucose, UA: NEGATIVE mg/dL
Ketones, ur: NEGATIVE mg/dL
Leukocytes, UA: NEGATIVE
Protein, ur: NEGATIVE mg/dL

## 2010-05-18 MED ORDER — IOHEXOL 300 MG/ML  SOLN
75.0000 mL | Freq: Once | INTRAMUSCULAR | Status: AC | PRN
  Administered 2010-05-18: 75 mL via INTRAVENOUS

## 2010-05-19 LAB — URINE CULTURE
Colony Count: NO GROWTH
Culture  Setup Time: 201204060839
Culture: NO GROWTH

## 2010-05-25 NOTE — Op Note (Signed)
Jennifer Green, Jennifer Green               ACCOUNT NO.:  [id number]  MEDICAL RECORD NO.:  [id number]           PATIENT TYPE:  I  LOCATION:  3524                         FACILITY:  [facility]  PHYSICIAN:  Cristi Loron, M.D.DATE OF BIRTH:  1940/08/27  DATE OF PROCEDURE:  05/16/2010 DATE OF DISCHARGE:                              OPERATIVE REPORT   BRIEF HISTORY:  The patient is a 70 year old white female who has suffered from neck and arm pain consistent with a cervical radiculopathy.  She has failed medical management, worked up with cervical MRI which demonstrated the patient had multilevel degenerative changes, but has significant foraminal stenosis at C5-6.  I discussed the various treatment options with the patient including surgery.  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  PROCEDURE:  C5-6 extensive anterior cervical diskectomy/decompression; C5-6 anterior interbody arthrodesis with local morselized autograft bone and Actifuse bone graft extender; insertion of C5-6 interbody prosthesis (Novel PEEK interbody prosthesis); anterior cervical plating C5-6 with Skyline titanium plate and screws.  SURGEON:  Cristi Loron, MD  ASSISTANT:  Stefani Dama, MD  ANESTHESIA:  General endotracheal.  ESTIMATED BLOOD LOSS:  50 mL.  SPECIMENS:  None.  DRAINS:  None.  COMPLICATIONS:  None.  DESCRIPTION OF PROCEDURE:  The patient was brought to the operating room by the anesthesia team.  General endotracheal anesthesia was induced. The patient remained in supine position.  A roll was placed  under her shoulders placing her neck in slight extension.  Her anterior cervical region was then prepared with Betadine scrub and Betadine solution. Sterile drapes were applied.  I then injected the area to be incised with Marcaine with epinephrine solution and used a scalpel to make a transverse incision in the patient's left anterior neck.  I used a Metzenbaum scissors to divide the platysma muscle and then to dissect medial to sternocleidomastoid muscle, jugular vein, and carotid artery. We carefully dissected down towards the anterior cervical spine identifying the esophagus and retracting it medially.  I then used Kittner swabs to clear the soft tissue from the anterior cervical spine. I inserted a bent spinal needle into the upper exposed intervertebral disk space.  We obtained intraoperative radiograph to confirm our location.  We then used electrocautery to detach the medial border of the longus coli muscle bilaterally.  We then inserted a Caspar self- retaining retractor underneath the longus coli muscle bilaterally to provide exposure.  We began the decompression at C5-6 by attempting to incise the intervertebral disks.  The disk space was quite spondylotic.  We then used the high-speed drill to remove some ventral spondylosis to gain access into the intervertebral space.  I then sized the intervertebral disk and performed a partial intervertebral diskectomy with a pituitary forceps.  We inserted distraction screws at C5 and C6, distracted the interspace.  I then used the high-speed drill to decorticate the vertebral  endplates at C5-6, to drill away the remainder of C5-6 intervertebral disk, to drill away some posterior spondylosis, and to thin out the posterior longitudinal ligament.  We then incised the ligament with arachnoid knife and removed with Kerrison punch undercutting the vertebral endplates at C5-6 decompressing the thecal sac.  We then performed foraminotomies  about the bilateral C6 nerve root completing the decompression at this level.  We now turned attention to the arthrodesis.  We used trial spacers and determined to use a 7-mm medium interbody prosthesis.  We prefilled the prosthesis with combination of local morselized autograft bone we obtained during the decompression as well as Actifuse bone graft extender.  We inserted the prosthesis and distracted C5-6 interspace and then removed distraction screws.  There was good snug fit of the prosthesis in the interspace.  We now turned attention to the anterior spinal instrumentation.  We used a high-speed drill to remove some ventral spondylosis at the vertebral endplates at C5-6 so that the plate will lay down flat.  We then selected appropriate length Skyline titanium plate and laid it below the anterior aspect of the vertebral bodies at C5-6.  We then drilled two 12- mm holes at C5 and two at C6 and then secured the plate to the vertebral bodies by placing two 12-mm self-tapping screws at C5 and two at C6.  We got good bony purchase.  We then obtained intraoperative radiograph which demonstrated good position of the instrumentation.  We therefore secured the screws in plate by locking each cam.  This completed the instrumentation.  We then obtained hemostasis using bipolar electrocautery.  We irrigated out with bacitracin solution.  We then removed the retractor and we inspected the esophagus for any damage, there was none apparent.  We then reapproximated the patient's platysma muscle with interrupted 3-0 Vicryl suture, subcutaneous tissue with interrupted 3-0 Vicryl suture, and the skin with Steri-Strips and Benzoin.  The wound was then coated with bacitracin ointment.  A sterile dressing was applied.  The drapes were removed.  The patient was subsequently extubated by the anesthesia team and transported to postanesthesia care unit in stable condition. All sponge, instrument, and  needle counts were correct at the end of this case.     Cristi Loron, M.D.     JDJ/MEDQ  D:  05/16/2010  T:  05/17/2010  Job:  [id number]  Electronically Signed by Tressie Stalker M.D. on 05/25/2010 08:44:35 AM

## 2010-05-28 ENCOUNTER — Ambulatory Visit (INDEPENDENT_AMBULATORY_CARE_PROVIDER_SITE_OTHER): Payer: Medicare Other | Admitting: Family Medicine

## 2010-05-28 ENCOUNTER — Encounter: Payer: Self-pay | Admitting: Family Medicine

## 2010-05-28 VITALS — BP 120/78 | Temp 98.1°F | Ht 60.0 in | Wt 136.0 lb

## 2010-05-28 DIAGNOSIS — M7020 Olecranon bursitis, unspecified elbow: Secondary | ICD-10-CM

## 2010-05-28 DIAGNOSIS — M109 Gout, unspecified: Secondary | ICD-10-CM

## 2010-05-28 LAB — CBC WITH DIFFERENTIAL/PLATELET
Basophils Absolute: 0 10*3/uL (ref 0.0–0.1)
Eosinophils Absolute: 0 10*3/uL (ref 0.0–0.7)
Eosinophils Relative: 0.1 % (ref 0.0–5.0)
Lymphs Abs: 1.5 10*3/uL (ref 0.7–4.0)
MCHC: 33.8 g/dL (ref 30.0–36.0)
MCV: 97.3 fl (ref 78.0–100.0)
Monocytes Absolute: 0.1 10*3/uL (ref 0.1–1.0)
Neutrophils Relative %: 82.6 % — ABNORMAL HIGH (ref 43.0–77.0)
Platelets: 420 10*3/uL — ABNORMAL HIGH (ref 150.0–400.0)
RDW: 14.6 % (ref 11.5–14.6)
WBC: 9.6 10*3/uL (ref 4.5–10.5)

## 2010-05-28 NOTE — Patient Instructions (Signed)
Take 600 mg of Motrin twice daily with food.  Elevate your right elbow and ice it  30 minutes 4 times daily.  We will call you report on your blood work tomorrow

## 2010-05-28 NOTE — Progress Notes (Signed)
  Subjective:    Patient ID: Jennifer Green, female    DOB: [birth year], 70 y.o.   MRN: [id number]  HPI Coila is a 70 year old, married female, nonsmoker, who comes in today for evaluation of pain and swelling of her right elbow for 3 days.  On April 4 he had her cervical disk surgery done by Dr. Lovell Sheehan, and it went well.  On Saturday this past weekend she noticed pain and swelling of her right elbow.  She has a history of gout.  She's been taking her allopurinol and a daily basis.  The redness and swelling are not as bad as they were on Saturday, but are still present.  No history of trauma   Review of Systems    General metabolic and orthopedic review of systems otherwise negative Objective:   Physical Exam Well-developed well-nourished, female in no acute distress.  Examination of the arms shows a left arm to be normal.  Right arm.  There is some tenderness around the olecranon bursa with some erythema.       Assessment & Plan:  Olecranon bursitis, right elbow,,,,,,,,, check uric acid level,,,,,,,,,,, Motrin 600 t.i.d. With food

## 2010-05-29 ENCOUNTER — Telehealth: Payer: Self-pay | Admitting: *Deleted

## 2010-05-29 NOTE — Telephone Encounter (Signed)
Uric acid level normal,,,,,,,,,,,, and therefore, her problem is not related to gout,,,,,,,,, continue her current medications if the swelling and redness does not resolve.  I would recommend that he see Dr. Norlene Campbell for consultation

## 2010-05-29 NOTE — Telephone Encounter (Signed)
Pt would like to have lab results.

## 2010-05-30 NOTE — Telephone Encounter (Signed)
Left message on machine for patient  To return our call 

## 2010-05-30 NOTE — Progress Notes (Signed)
patient  Is aware 

## 2010-05-31 NOTE — Telephone Encounter (Signed)
Spoke with patient.

## 2010-06-12 ENCOUNTER — Ambulatory Visit: Payer: PRIVATE HEALTH INSURANCE | Admitting: Family Medicine

## 2010-06-16 ENCOUNTER — Other Ambulatory Visit: Payer: Self-pay | Admitting: Family Medicine

## 2010-06-18 NOTE — Telephone Encounter (Signed)
Okayed a refill trazodone 50 mg, dispense 100 tabs directions one nightly, refills x 2

## 2010-06-29 NOTE — Discharge Summary (Signed)
[facility]  Patient:    Jennifer Green, Jennifer Green                      MRN: [id number] Adm. Date:  [id number] Disc. Date: [id number] Attending:  Dolores Patty CC:         Evette Georges, M.D. [facility]   Discharge Summary  ADMISSION DIAGNOSES: 1. Chest pain, atypical. 2. Gout.  DISCHARGE DIAGNOSIS:  Atypical chest pain, most likely related to esophageal reflux secondary to Indocin for gout.  OPERATIONS/PROCEDURES:  None.  HISTORY OF PRESENT ILLNESS:  Jennifer Green is a 70 year old female who woke up with pounding in her ears at approximately 2 a.m. on 7/21.  She had nausea and vomiting through the night, vomiting approximately six times.  This was accompanied by pressure and tightness substernally with radiation to the right shoulder and left shoulder blade.  The symptoms would last 5 minutes or more. This was associated with the sensation of being chilled.  She denied any diaphoresis.  She had been on Indocin for three days for gout.  PAST MEDICAL HISTORY: 1. Hypertension, for which she takes atacand. 2. She had been on Zantac for esophageal reflux in the past, but had not taken    this medication for 3 or 4 days. 3. She had been off hydrochlorothiazide for approximately one week.  FAMILY HISTORY:  Positive for myocardial infarction in her father and stroke in her mother.  PHYSICAL EXAMINATION:  ABDOMEN:  She had hyperactive bowel sounds.  HEART:  Grade 1/2 to 1 systolic murmur was present.  LABORATORY DATA:  EKG revealed low voltage; there were no ischemic ST-T wave changes.  Her hematocrit was 30.4.  Uric acid was high normal at 6.9.  Amylase and lipase were normal.  The glucose was 124, but she was on IV fluids. Urinalysis was negative.  Serial CPKs were negative, and troponin was normal.  Chest x-ray showed borderline cardiac size; there was no active disease. Ultrasound of the gallbladder revealed gallbladder polyps that looked otherwise  normal.  On the morning of discharge her blood pressure was 148/84.  She was asymptomatic with no chest pain, nausea, or abdominal pain.  She had been placed on Protonix 40 mg q.d., and this was to be continued as an outpatient.  It was recommended she continue to avoid the hydrochlorothiazide because of her gout.  A ______ restricted diet would be recommended.  A copy of such will be requested from nutrition.  If they do not have such a dietary list, then she can obtain this from Dr. Tawanna Cooler when she sees him in followup.  She will continue her atacand.  She will be given a prescription for Celebrex 200 mg one b.i.d. p.r.n. gout symptoms.  DISCHARGE STATUS:  Improved.  PROGNOSIS:  Good.  FOLLOWUP:  She will need followup of the anemia.  Endoscopic evaluation may be necessary depending on the results of hematocrit monitor and response to Protonix.  She will be asked to monitor her blood pressure, and take the cuff and blood pressure diary to a visit with Dr. Tawanna Cooler in approximately 10 to 14 days.  DIET:  Of choice.  It will be recommended that she avoid foods which would increase acid reduction, such as aspirin family (ibuprofen, naproxen, aspirin, etc.), alcohol, peppermint, tobacco, and caffeine (coffee, tea, cola, and chocolate). DD:  09/01/00 TD:  09/01/00 Job: [id number]

## 2010-06-29 NOTE — H&P (Signed)
[facility]  Patient:    Jennifer Green, Jennifer Green                      MRN: [id number] Adm. Date:  [id number] Attending:  Dolores Patty CC:         Evette Georges, M.D. [facility]   History and Physical  HISTORY OF PRESENT ILLNESS:  Jennifer Green is a 70 year old white female who woke up with "pounding in my ears" approximately 2 a.m.; subsequently, she developed nausea and vomiting on six occasions through the night.  This was associated with pressure and tightness in her chest.  This was described as substernal with radiation to the right shoulder and left shoulder blades.  The chest discomfort would last five minutes or so.  There was no diaphoresis; she was chilled.  Significantly, she has been on Indocin for three days for gout which has been present for six days.  PAST MEDICAL HISTORY:  Her past history includes disk surgery.  She is a gravida 2, para 2.  She was hospitalized for pneumonia in the past.  CURRENT MEDICATIONS:  She is on Atacand for hypertension.  She was previously on hydrochlorothiazide but this was stopped a week ago.  FAMILY HISTORY:  Positive for myocardial infarction in her father at 79 and stroke in her mother at 54.  There is no diabetes or cancer in the family.  REVIEW OF SYSTEMS:  She has no pulmonary symptoms on review of systems.  She does have a history of esophageal reflux and takes Zantac as needed.  She ran out three or four days ago.  She denies any melena or rectal bleeding. There has been no change in the weight.  She has no genitourinary symptoms.  Her only musculoskeletal complaint is of gout in the right great toe, which has essentially resolved at this time.  PHYSICAL EXAMINATION:  VITAL SIGNS:  Her initial blood pressure was 178/88, pulse 85 and respiratory rate 20.  Her temperature was 99.8.  HEENT/NECK:  She has arteriolar narrowing.  She has no lymphadenopathy about the head or neck.   Thyroid is normal to palpation.  She has no carotid bruits.  CARDIAC:  There is a grade 1/2 to 1 systolic murmur across the precordium.  LUNGS:  Minimal rales are noted at the bases.  ABDOMEN:  Bowel sounds are hyperactive.  The abdomen is nontender and there are no masses noted.  SKIN:  Warm and dry.  EXTREMITIES:  Pedal pulses are intact.  There is no tenderness over the base of the right toe.  NEUROLOGIC:  There are no localizing neurologic signs.  She is oriented x 3.  LABORATORY DATA:  EKG shows a tendency toward low voltage.  Troponin is 0.01.  Glucose was 124.  Hematocrit was 30.4.  ASSESSMENT AND PLAN:  She is now admitted with atypical chest discomfort which is most likely related to esophageal reflux and esophageal spasm in the context of nausea and vomiting.  The pain did respond to nitroglycerin. Nitroglycerin will treat esophageal spasm effectively.  She has gout which is quiescent at this time.  Uric acid will be checked.  She will be given Celebrex 200 mg b.i.d. as needed for joint pain.  Fecal occult blood studies will be performed.  She will be placed on Protonix 40 mg daily.  If enzymes are negative, then ultrasound of gallbladder will be considered. DD:  08/31/00 TD:  09/01/00 Job: [id number]

## 2010-06-29 NOTE — Op Note (Signed)
[name], Jennifer Green               ACCOUNT NO.:  [id number]   MEDICAL RECORD NO.:  [id number]          PATIENT TYPE:  OIB   LOCATION:  2899                         FACILITY:  [facility]   PHYSICIAN:  Alford Highland. Rankin, M.D.   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   POSTOPERATIVE DIAGNOSES:  Epiretinal membrane, right eye.   PROCEDURE:  Posterior vitrectomy, right eye with membrane peel - internal  limiting membrane, left eye.   SURGEON:  Alford Highland. Rankin, M.D.   ANESTHESIA:  Local retrobulbar.   INDICATIONS FOR PROCEDURE:  The patient is a 70 year old woman who has  significant impairment of activities of daily living on the basis of  distorted vision in the right eye from an epiretinal membrane.  This is an  attempt to release the traction and epiretinal distortion, so as to allow  for visual acuity improvement.  The patient understands the risks and benefits of anesthesia including loss  of the eye, including but not limited to hemorrhage, infection, scarring,  need for further surgery, no change in vision, loss of vision and  progression of disease despite intervention. She also understands the risks  of progression of cataract.  She wished to proceed.  After appropriate  signed consent was obtained, the patient was taken to the operating room.   DESCRIPTION OF PROCEDURE:  In the operating room, appropriate monitors and  mild sedation applied. Then 0.75% Marcaine delivered 5 cc  retrobulbar,followed by an additional 5 cc laterally in the fashion of  modified VanLint.  Right periocular region was sterilely prepped and draped  in the usual fashion.  Lid speculum applied.  Conjunctival peritomy was then  fashioned on each of the quadrants with the exception of inferonasal.  A 4  mm  infusion was placed secured 4 mm posterior to the limbus in  the  inferotemporal quadrant.  Placement in the vitreous cavity verified  visually.  Superior sclerotomy was then fashioned.  The microscope placed  and positioned with Biom attached.   Core vitrectomy was then begun.  Iatrogenic posterior detachment was created  with active suction to nasal to the optic nerve.  This was elevated anterior  to the equator 360 degrees and trimmed.  Care was taken to leave some retro-  lental vitreous in place.  At this time, Dorc forceps were then used to  engage the epiretinal membrane inferiorly.  This was done in the epiretinal  membrane and internal limiting membrane was removed in a continuous,  circular tear fashion without problem.  No complications occurred.  Peripheral retina was inspected and found to be free of retinal holes or  tears.  The instruments were removed from the eye.  Superior sclerotomy was  closed with 7-  0 Vicryl.  The infusion was removed and  closed with 7-0 Vicryl.  Conjunctiva ws closed as well with 7-0 Vicryl.  Subconjunctival injections  of antibiotic was applied. The patient had a sterile patch and Caryn Section  shield  applied.  The patient was taken to the short-stay, discharged home as an  outpatient.       GAR/MEDQ  D:  12/12/2003  T:  12/12/2003  Job:  [id number]

## 2010-09-19 ENCOUNTER — Other Ambulatory Visit: Payer: Self-pay | Admitting: Family Medicine

## 2010-11-28 ENCOUNTER — Inpatient Hospital Stay (HOSPITAL_COMMUNITY)
Admission: EM | Admit: 2010-11-28 | Discharge: 2010-11-30 | DRG: 312 | Disposition: A | Discharge status: Home / Self Care | Payer: Medicare Other | Attending: Internal Medicine | Admitting: Internal Medicine

## 2010-11-28 ENCOUNTER — Emergency Department (HOSPITAL_COMMUNITY): Payer: Medicare Other

## 2010-11-28 ENCOUNTER — Telehealth: Payer: Self-pay | Admitting: *Deleted

## 2010-11-28 DIAGNOSIS — R002 Palpitations: Secondary | ICD-10-CM | POA: Diagnosis present

## 2010-11-28 DIAGNOSIS — I10 Essential (primary) hypertension: Secondary | ICD-10-CM | POA: Diagnosis present

## 2010-11-28 DIAGNOSIS — R350 Frequency of micturition: Secondary | ICD-10-CM | POA: Diagnosis present

## 2010-11-28 DIAGNOSIS — E876 Hypokalemia: Secondary | ICD-10-CM | POA: Diagnosis present

## 2010-11-28 DIAGNOSIS — R55 Syncope and collapse: Principal | ICD-10-CM | POA: Diagnosis present

## 2010-11-28 LAB — T4, FREE: Free T4: 1.34 ng/dL (ref 0.80–1.80)

## 2010-11-28 LAB — DIFFERENTIAL
Basophils Absolute: 0 10*3/uL (ref 0.0–0.1)
Basophils Relative: 0 % (ref 0–1)
Eosinophils Absolute: 0 10*3/uL (ref 0.0–0.7)
Neutro Abs: 3.2 10*3/uL (ref 1.7–7.7)
Neutrophils Relative %: 56 % (ref 43–77)

## 2010-11-28 LAB — BASIC METABOLIC PANEL
BUN: 15 mg/dL (ref 6–23)
Chloride: 96 mEq/L (ref 96–112)
Creatinine, Ser: 0.75 mg/dL (ref 0.50–1.10)
GFR calc Af Amer: >90 mL/min (ref >90)
GFR calc non Af Amer: 83 mL/min — ABNORMAL LOW (ref >90)
Potassium: 2.4 mEq/L — CL (ref 3.5–5.1)

## 2010-11-28 LAB — CBC
Hemoglobin: 13.9 g/dL (ref 12.0–15.0)
Platelets: 257 10*3/uL (ref 150–400)
RBC: 4.26 MIL/uL (ref 3.87–5.11)
WBC: 5.8 10*3/uL (ref 4.0–10.5)

## 2010-11-28 LAB — POCT I-STAT TROPONIN I: Troponin i, poc: 0 ng/mL (ref 0.00–0.08)

## 2010-11-28 LAB — MAGNESIUM: Magnesium: 1.5 mg/dL (ref 1.5–2.5)

## 2010-11-28 NOTE — Telephone Encounter (Signed)
Pt is having episodes of rapid heart rate, chest pain, jaw pain and extreme weakness x 4 weeks.  Advised ER ASAP for evaluation.

## 2010-11-29 DIAGNOSIS — R002 Palpitations: Secondary | ICD-10-CM

## 2010-11-29 DIAGNOSIS — R55 Syncope and collapse: Secondary | ICD-10-CM

## 2010-11-29 LAB — CARDIAC PANEL(CRET KIN+CKTOT+MB+TROPI)
CK, MB: 1.9 ng/mL (ref 0.3–4.0)
Relative Index: INVALID (ref 0.0–2.5)
Troponin I: <0.3 ng/mL (ref <0.30)
Troponin I: <0.3 ng/mL (ref <0.30)
Troponin I: <0.3 ng/mL (ref <0.30)

## 2010-11-29 LAB — BASIC METABOLIC PANEL
BUN: 12 mg/dL (ref 6–23)
Chloride: 99 mEq/L (ref 96–112)
GFR calc Af Amer: >90 mL/min (ref >90)
Potassium: 3.5 mEq/L (ref 3.5–5.1)

## 2010-11-29 LAB — URINALYSIS, MICROSCOPIC ONLY
Bilirubin Urine: NEGATIVE
Protein, ur: NEGATIVE mg/dL
Urobilinogen, UA: 0.2 mg/dL (ref 0.0–1.0)

## 2010-11-29 LAB — MAGNESIUM: Magnesium: 2 mg/dL (ref 1.5–2.5)

## 2010-11-30 ENCOUNTER — Inpatient Hospital Stay (HOSPITAL_COMMUNITY): Payer: Medicare Other

## 2010-11-30 DIAGNOSIS — R072 Precordial pain: Secondary | ICD-10-CM

## 2010-11-30 LAB — BASIC METABOLIC PANEL
BUN: 18 mg/dL (ref 6–23)
CO2: 25 mEq/L (ref 19–32)
Calcium: 8.9 mg/dL (ref 8.4–10.5)
Chloride: 100 mEq/L (ref 96–112)
Creatinine, Ser: 0.77 mg/dL (ref 0.50–1.10)
GFR calc Af Amer: >90 mL/min (ref >90)
GFR calc non Af Amer: 82 mL/min — ABNORMAL LOW (ref >90)
Glucose, Bld: 117 mg/dL — ABNORMAL HIGH (ref 70–99)
Potassium: 3.4 mEq/L — ABNORMAL LOW (ref 3.5–5.1)
Sodium: 136 mEq/L (ref 135–145)

## 2010-11-30 LAB — URINE CULTURE
Colony Count: NO GROWTH
Culture  Setup Time: 201210181800
Culture: NO GROWTH

## 2010-11-30 MED ORDER — TECHNETIUM TC 99M TETROFOSMIN IV KIT
30.0000 | PACK | Freq: Once | INTRAVENOUS | Status: AC | PRN
  Administered 2010-11-30: 30 via INTRAVENOUS

## 2010-11-30 MED ORDER — TECHNETIUM TC 99M TETROFOSMIN IV KIT
10.0000 | PACK | Freq: Once | INTRAVENOUS | Status: AC | PRN
  Administered 2010-11-30: 11 via INTRAVENOUS

## 2010-11-30 NOTE — Telephone Encounter (Signed)
Dr Todd informed 

## 2010-12-05 ENCOUNTER — Ambulatory Visit (INDEPENDENT_AMBULATORY_CARE_PROVIDER_SITE_OTHER): Payer: Medicare Other | Admitting: Family Medicine

## 2010-12-05 ENCOUNTER — Encounter: Payer: Self-pay | Admitting: Family Medicine

## 2010-12-05 VITALS — BP 120/80 | Temp 98.4°F | Wt 133.0 lb

## 2010-12-05 DIAGNOSIS — Z23 Encounter for immunization: Secondary | ICD-10-CM

## 2010-12-05 DIAGNOSIS — I499 Cardiac arrhythmia, unspecified: Secondary | ICD-10-CM

## 2010-12-05 DIAGNOSIS — I10 Essential (primary) hypertension: Secondary | ICD-10-CM

## 2010-12-05 DIAGNOSIS — E876 Hypokalemia: Secondary | ICD-10-CM | POA: Insufficient documentation

## 2010-12-05 LAB — BASIC METABOLIC PANEL
BUN: 23 mg/dL (ref 6–23)
CO2: 27 mEq/L (ref 19–32)
Calcium: 9.8 mg/dL (ref 8.4–10.5)
Creatinine, Ser: 0.9 mg/dL (ref 0.4–1.2)

## 2010-12-05 NOTE — Progress Notes (Signed)
  Subjective:    Patient ID: Jennifer Green, female    DOB: [birth year], 70 y.o.   MRN: [id number]  HPI [name] is a 70 year old female, nonsmoker, who was hospitalized recently October the 17th of October, the 19th  She went to the emergency room because of irregular heart rate and palpitations.  She had a cardiac evaluation in the hospital, which included a echocardiogram, and a stress test, which were normal.  She is due to be put on a monitor and will be follow-up by Dr. Peter [place].  In the hospital.  She was found to have a low serum potassium.  It was 2.4.  She was given IV and oral supplements and discharged on potassium 40 mEq b.i.d.  She was also given a beta-blocker 25 mg to take every 6 hours if her heart rate got over a hundred.  Also, her Tenoretic was discontinued.  Her Norvasc was cut to 5 mg daily.  She comes in today for follow-up.  She was also given Ambien, which I recommend she not take.  She states her heart rate continues to be irregular.  She notices to skipping, but it is not rapid.   Review of Systems    General and cardiovascular review of systems otherwise negative Objective:   Physical Exam  Thin female no acute distress.  Lungs are clear to auscultation.  BP 120/80.  Pulse was 70 and irregular.  EKG pending      Assessment & Plan:  Hypokalemia check potassium hold the 40 mEq b.i.d., dose for now.  Hypertension with good blood pressure control on Norvasc 5 mg daily continue.  Irregular heart rate continue follow-up by cardiology, beta-blocker 25 mg p.r.n. For rapid heart rate

## 2010-12-05 NOTE — Patient Instructions (Signed)
Stop the potassium  Do not take the Ambien.  Follow-up with cardiology as outlined.  Check blood pressure daily in the morning.  We will call you this afternoon with the report of your blood work.  Return to see me in 4 weeks for follow-up  Something mild for sleep with the melatonin or 25 mg of Benadryl at bedtime

## 2010-12-06 NOTE — Consult Note (Signed)
NAMEMarland Green  ROSHONDA, SPERL NO.:  [id number]  MEDICAL RECORD NO.:  [id number]  LOCATION:  3704                         FACILITY:  [facility]  PHYSICIAN:  Vesta Mixer, M.D. DATE OF BIRTH:  Jul 11, 1940  DATE OF CONSULTATION:  11/29/2010 DATE OF DISCHARGE:                                CONSULTATION   Jennifer Green is a 70 year old female with a history of hypertension. She was admitted with some episodes of palpitations and lightheadedness.  Over the past several weeks, the patient has not been feeling well.  She has noticed that she becomes very fatigued and short of breath doing her normal activities.  She previously could work and do all of her housework without having to stop.  She now becomes very short of breath and fatigue cleaning only 2 rooms.  She has also had some palpitations which have been associated with some near syncope.  She has never had any complete episodes of syncope.  These maybe associated with exercise, although she has some at other times as well.  There is no pleuritic component.  She denies any hemoptysis.  She denies any fevers or chills.  She has had a cough that she thinks maybe due to allergies.  The patient was admitted to the hospital for further evaluation.  CURRENT MEDICATIONS: 1. Aspirin 81 mg a day. 2. Norvasc 10 mg a day. 3. Desyrel. 4. Atenolol. 5. Chlorthalidone 25 mg/12.5 mg a day. 6. She also has been started on potassium here in the hospital.  ALLERGIES:  She is allergic to PHENERGAN and INDOMETHACIN.  PAST MEDICAL HISTORY:  Hypertension.  SOCIAL HISTORY:  The patient is a nonsmoker and a nondrinker.  FAMILY HISTORY:  Positive for coronary artery disease in her father. Her mother had strokes.  PHYSICAL EXAMINATION:  GENERAL:  She is an elderly female, in no acute distress.  She is alert and oriented x3.  Mood and affect are normal. VITAL SIGNS:  Her temperature is 98.4, heart rate is 80 and blood pressure is  146/79. HEENT:  Carotid 2+.  She has no bruits, no JVD, no thyromegaly. LUNGS:  Clear. NECK:  Supple.  Her mucous membranes are moist. HEART:  Regular rate S1 and S2.  She has a soft systolic murmur. ABDOMEN:  Good bowel sounds.  There is no abdominal tenderness.  There is no guarding or rebound. EXTREMITIES:  No clubbing, cyanosis or edema.  There is no palpable cords.  Her EKG reveals normal sinus rhythm.  She has T-wave inversions in the anterior leads.  I found old EKGs that looks similar to this.  There are no acute changes.  Her laboratory data reveals negative cardiac enzymes.  Her sodium this morning is 139, potassium is 3.5, chloride is 99, CO2 is 27, glucose is 125, BUN is 12, creatinine 0.67.  Cardiac enzymes are negative.  Of note, is that yesterday during admission her potassium was low at 2.4.  Echocardiogram is pending.  IMPRESSION AND PLAN: 1. Palpitations.  The patient has documented premature ventricular     contractions on her 12-lead EKG.  In addition, her potassium was     very low on admission.  This certainly could  explain some of her     palpitations, but I do not think that would explain her episodes of     near syncope.  We have already supplemented her potassium.  We will     continue to watch her telemetry monitor. 2. Exertional dyspnea and chest pain.  The patient is having     exertional dyspnea that seems to be out of proportion to what she     would expect.  She also was having some chest pain that occurs in     the interscapular region.  We will schedule her for a stress     Myoview study for tomorrow.  I will make further plans based on the     Myoview study. 3. All of her other medical problems remain fairly stable.     Vesta Mixer, M.D.     PJN/MEDQ  D:  11/29/2010  T:  11/30/2010  Job:  [id number]  cc:   Tinnie Gens A. Tawanna Cooler, MD  Electronically Signed by Kristeen Miss M.D. on 12/06/2010 05:40:18 AM

## 2010-12-07 ENCOUNTER — Encounter (INDEPENDENT_AMBULATORY_CARE_PROVIDER_SITE_OTHER): Payer: Medicare Other

## 2010-12-07 ENCOUNTER — Telehealth: Payer: Self-pay

## 2010-12-07 DIAGNOSIS — R55 Syncope and collapse: Secondary | ICD-10-CM

## 2010-12-10 ENCOUNTER — Telehealth: Payer: Self-pay | Admitting: *Deleted

## 2010-12-10 MED ORDER — HYDROCODONE-HOMATROPINE 5-1.5 MG/5ML PO SYRP: 5.0000 mL | ORAL_SOLUTION | Freq: Four times a day (QID) | ORAL | Status: DC | PRN

## 2010-12-10 NOTE — H&P (Signed)
NAMEMarland Green  AMOREE, NEWLON NO.:  [id number]  MEDICAL RECORD NO.:  [id number]  LOCATION:  3703                         FACILITY:  [facility]  PHYSICIAN:  Altha Harm, MD     DATE OF BIRTH:  DATE OF ADMISSION:  11/28/2010 DATE OF DISCHARGE:                             HISTORY & PHYSICAL   CHIEF COMPLAINT:  Palpitations and near syncope.  HISTORY OF PRESENT ILLNESS:  Ms. Kington is a 70 year old female with a history of hypertension, who states that approximately 2 weeks ago, she started having some palpitations.  She states that she pain no real attention to them; however, in the past 48 hours, she was having palpitations, which then progressed to palpitations with near syncope. She states that the near syncopal episode, this would prompt her to come to the emergency room.  The patient denies any chest pain.  She denies any nausea, vomiting, or diarrhea.  She denies any seizure disorder. She denies any cough.  She denies any urinary frequency, urgency, or dysuria.  The patient denies any decreasing strength.  She states that the near syncope just a feeling of faintness during the time of palpitations.  PAST MEDICAL HISTORY:  Significant for hypertension.  Please note that in 2002, the patient had a similar episode with the use of indomethacin, but since then she has had no new episodes of palpitation.  FAMILY HISTORY:  Significant for myocardial infarction in her father. She is unsure of the age, which she had it and a CVA in her mother.  She also states that her mother had problems with electrolyte imbalances.  SOCIAL HISTORY:  The patient resides with her husband, who is also her healthcare power of attorney.  He can be reached at [phone].  The patient denies any tobacco, alcohol, or drug use at this time and she is a retired Airline pilot for [employer].  ALLERGIES: 1. INDOMETHACIN. 2. PHENERGAN.  MEDICATIONS:  The patient states that she takes atenolol  and some type of diuretic, which is not sure which one.  She also uses drops for her eyes.  She is unable to give me the names of them.  She does take amlodipine, but she is unsure of the dose at this time.  We will have her medications reconciled per pharmacy and then I will verify them with the patient.  PRIMARY CARE PHYSICIAN:  Tinnie Gens A. Tawanna Cooler, MD  REVIEW OF SYSTEMS:  All other systems was negative, except as noted in the HPI.  Studies in the emergency room shows the following.  Troponins 0.00. Sodium 138, potassium 2.4, chloride 96, bicarb 29, BUN 15, and creatinine 0.75.  White blood cell count is 5.8, hemoglobin 13.9, hematocrit 38.9, platelets 257.  Magnesium 1.5 .  A 2-view chest x-ray shows no evidence of active pulmonary disease.  Urinalysis is pending at this time.  A 12-lead EKG shows a prolonged QT corrected interval and she has nonspecific ST-T wave changes.  PHYSICAL EXAMINATION:  GENERAL:  The patient is lying in bed, in no acute distress. VITAL SIGNS:  Temperature is 98.4, heart rate 78, blood pressure 115/67, respiratory rate 16, and O2 sats are 97% on room air. HEENT:  She is normocephalic and atraumatic.  Pupils equally round and reactive to light and accommodation.  Extraocular movements are intact. Oropharynx is moist.  No exudate, erythema, or lesions are noted. NECK:  Trachea is midline.  No masses.  No thyromegaly.  No JVD.  No carotid bruit. RESPIRATORY:  The patient has a normal respiratory effort.  Equal excursion bilaterally.  No wheezing or rhonchi noted. CARDIOVASCULAR:  She has got normal S1 and S2.  No murmurs, rubs, or gallops are noted.  PMI is nondisplaced.  No heaves or thrills on palpation. ABDOMEN:  Obese, soft, nontender, and nondistended.  No masses.  No hepatosplenomegaly. EXTREMITIES:  No clubbing, cyanosis, or edema.  Lymph node survey she has got no cervical axillary or inguinal lymphadenopathy noted. MUSCULOSKELETAL:  She has got no  warmth swelling or erythema around the joints and there is no spinal tenderness noted. NEUROLOGIC:  No focal neurological deficits noted.  Cranial nerves II through XII were grossly intact.  DTRs are 2+ bilaterally in the upper and lower extremities. PSYCHIATRIC:  She is alert and oriented times 3.  Good insight and cognition.  Good reach to remote recall.  ASSESSMENT AND PLAN:  The patient presents with; 1. Palpitations. 2. Hypokalemia. 3. Near-syncope. 4. Hypomagnesemia. 5. Prolonged QT interval corrected. 6. Hypertension.  At this time, we will go ahead and replace the patient's electrolytes, both her potassium and magnesium.  I will hold all medications at this time until they have been verified, particularly I will hold any diuretic portion of the patient's medication.  We will admit the patient to a telemetry bed and monitor her cardiac rhythm for any abnormalities. The patient will be started on aspirin.  We will cycle the patient's cardiac enzymes and I will get a 2D echocardiogram to evaluate the architecture of the left ventricle.  Should the patient have any abnormalities in terms of her enzymes, any further arrhythmias or any abnormalities on the echocardiogram, I will pursue a cardiology consult with [facility] for this patient.  At this point, the urinalysis is pending and we will follow up on that and further testing and therapeutic plan will depend on initial response to therapy and results of testing.  The patient will have DVT prophylaxis with Lovenox and she will be put on a heart healthy diet.     Altha Harm, MD     MAM/MEDQ  D:  11/29/2010  T:  11/29/2010  Job:  [id number]  cc:   Tinnie Gens A. Tawanna Cooler, MD  Electronically Signed by Marthann Schiller MD on 12/10/2010 01:09:33 PM

## 2010-12-10 NOTE — Telephone Encounter (Signed)
Hydromet 4 ounces directions 0.5-teaspoon nightly p.r.n. For cough, no refills.  If cough persists after this, then, office visit

## 2010-12-10 NOTE — Telephone Encounter (Signed)
rx called in and spoke with patient

## 2010-12-10 NOTE — Telephone Encounter (Signed)
Pt. States she has had a non productive cough x 6 weeks, and is asking for RX to be called to [facility]).

## 2010-12-15 NOTE — Discharge Summary (Signed)
NAMEMarland Kitchen  AIREAL, SLATER NO.:  [id number]  MEDICAL RECORD NO.:  [id number]  LOCATION:  3704                         FACILITY:  [facility]  PHYSICIAN:  Altha Harm, MDDATE OF BIRTH:  [birth year]  DATE OF ADMISSION:  11/28/2010 DATE OF DISCHARGE:  11/30/2010                              DISCHARGE SUMMARY   DISCHARGE DISPOSITION:  Home.  FINAL DISCHARGE DIAGNOSES: 1. Palpitations and near syncope. 2. Hypokalemia. 3. Hypomagnesemia. 4. Prolonged QT interval, now resolved. 5. Hypertension.  Discharge medications include the following: 1. Lopressor 25 mg p.o. q.6 h. as needed for heart rate greater than     100. 2. Potassium chloride 40 mEq p.o. b.i.d. 3. Ambien 5 mg p.o. at bedtime p.r.n. insomnia. 4. Allopurinol 300 mg p.o. daily. 5. Amlodipine 5 mg p.o. daily. 6. Brimonidine 0.15% ophthalmic solution 1 drop to each eye daily at     bedtime. 7. Dorzolamide ophthalmic solution 1 drop to each eye twice daily. 8. Latanoprost 0.005% ophthalmic solution 1 drop both eyes b.i.d. 9. Trazodone 50 mg p.o. at bedtime.  CONSULTANTS:  [facility].  PROCEDURES:  Stress Myoview which was found to have no evidence of ischemia.  DIAGNOSTIC STUDIES: 1. Chest x-ray, 2-view, on admission, which shows no evidence of     active pulmonary disease. 2. Stress Myoview which shows no evidence of ischemia or infarction in     the vascular territory.  CHIEF COMPLAINT:  Palpitations and near syncope.  HISTORY OF PRESENT ILLNESS:  Please refer to the H and P dictated on November 28, 2010, for details of the HPI.  However, in short, Ms. Delmont is a lovely 70 year old female with a history of hypertension, who states that she started having palpitations approximately 2 weeks prior to presentation.  However, on the day of admission, the patient also had a near syncopal episode associated with the palpitations.  We were asked to see the patient for further evaluation and  management.  HOSPITAL COURSE: 1. Significant hypokalemia and hypomagnesemia.  The patient was found     to have a potassium of 2.4 and magnesium of 1.5.  These were     replaced by IV and then she continued with supplementation.  It was     felt that this was secondary to diuretic effect of her medications     that she was taking at home. 2. Palpitations.  The patient did have palpitations and the     recommendations from Cardiology was for a beta-blocker on a p.r.n.     basis. 3. She initially showed no significant arrhythmias on the monitor once     her potassium and magnesium were corrected.  The patient did have a     mildly prolonged QT interval on admission, however, this corrected     once her electrolytes were replaced.  The patient was evaluated by Cardiology including the stress Myoview. She had no evidence of ischemia and the plan is for the patient to follow up with Cardiology as an outpatient for an event monitor to further evaluate her palpitations.  Otherwise, the patient remained stable in the hospital.  At the time of discharge, the patient was stable.  FOLLOWUP:  The patient was to follow up with Dr. Elease Hashimoto for appointment for an event monitor and the phone number was given in the form of [phone].  The patient also was to follow up with her primary care physician, Dr. Tawanna Cooler in 3-5 days.  DIETARY RESTRICTIONS:  None.  PHYSICAL RESTRICTIONS:  None.     Altha Harm, MD     MAM/MEDQ  D:  12/14/2010  T:  12/14/2010  Job:  [id number]  cc:   Vesta Mixer, M.D. Jeffrey A. Tawanna Cooler, MD  Electronically Signed by Marthann Schiller MD on 12/15/2010 06:16:42 PM

## 2010-12-17 ENCOUNTER — Encounter (HOSPITAL_COMMUNITY): Payer: Self-pay | Admitting: Emergency Medicine

## 2010-12-17 ENCOUNTER — Emergency Department (HOSPITAL_COMMUNITY): Payer: Medicare Other

## 2010-12-17 ENCOUNTER — Telehealth: Payer: Self-pay | Admitting: *Deleted

## 2010-12-17 ENCOUNTER — Telehealth: Payer: Self-pay | Admitting: Cardiology

## 2010-12-17 ENCOUNTER — Emergency Department (HOSPITAL_COMMUNITY)
Admission: EM | Admit: 2010-12-17 | Discharge: 2010-12-17 | Disposition: A | Discharge status: Home / Self Care | Payer: Medicare Other | Attending: Emergency Medicine | Admitting: Emergency Medicine

## 2010-12-17 DIAGNOSIS — J189 Pneumonia, unspecified organism: Secondary | ICD-10-CM

## 2010-12-17 DIAGNOSIS — R0602 Shortness of breath: Secondary | ICD-10-CM | POA: Insufficient documentation

## 2010-12-17 DIAGNOSIS — G4700 Insomnia, unspecified: Secondary | ICD-10-CM | POA: Insufficient documentation

## 2010-12-17 DIAGNOSIS — R002 Palpitations: Secondary | ICD-10-CM | POA: Insufficient documentation

## 2010-12-17 DIAGNOSIS — R6889 Other general symptoms and signs: Secondary | ICD-10-CM | POA: Insufficient documentation

## 2010-12-17 DIAGNOSIS — L919 Hypertrophic disorder of the skin, unspecified: Secondary | ICD-10-CM | POA: Insufficient documentation

## 2010-12-17 DIAGNOSIS — R011 Cardiac murmur, unspecified: Secondary | ICD-10-CM | POA: Insufficient documentation

## 2010-12-17 DIAGNOSIS — R110 Nausea: Secondary | ICD-10-CM | POA: Insufficient documentation

## 2010-12-17 DIAGNOSIS — M810 Age-related osteoporosis without current pathological fracture: Secondary | ICD-10-CM | POA: Insufficient documentation

## 2010-12-17 DIAGNOSIS — H409 Unspecified glaucoma: Secondary | ICD-10-CM | POA: Insufficient documentation

## 2010-12-17 DIAGNOSIS — K219 Gastro-esophageal reflux disease without esophagitis: Secondary | ICD-10-CM | POA: Insufficient documentation

## 2010-12-17 DIAGNOSIS — L909 Atrophic disorder of skin, unspecified: Secondary | ICD-10-CM | POA: Insufficient documentation

## 2010-12-17 DIAGNOSIS — R0789 Other chest pain: Secondary | ICD-10-CM | POA: Insufficient documentation

## 2010-12-17 LAB — BASIC METABOLIC PANEL
BUN: 18 mg/dL (ref 6–23)
Chloride: 103 mEq/L (ref 96–112)
GFR calc Af Amer: 73 mL/min — ABNORMAL LOW (ref >90)
Potassium: 3.1 mEq/L — ABNORMAL LOW (ref 3.5–5.1)

## 2010-12-17 LAB — CBC
Hemoglobin: 12.4 g/dL (ref 12.0–15.0)
MCHC: 34.2 g/dL (ref 30.0–36.0)
RBC: 3.81 MIL/uL — ABNORMAL LOW (ref 3.87–5.11)
WBC: 6.5 10*3/uL (ref 4.0–10.5)

## 2010-12-17 LAB — URINALYSIS, ROUTINE W REFLEX MICROSCOPIC
Bilirubin Urine: NEGATIVE
Leukocytes, UA: NEGATIVE
Nitrite: NEGATIVE
Specific Gravity, Urine: 1.011 (ref 1.005–1.030)
pH: 8 (ref 5.0–8.0)

## 2010-12-17 LAB — DIFFERENTIAL
Basophils Relative: 0 % (ref 0–1)
Lymphocytes Relative: 21 % (ref 12–46)
Lymphs Abs: 1.4 10*3/uL (ref 0.7–4.0)
Monocytes Relative: 7 % (ref 3–12)
Neutro Abs: 4.7 10*3/uL (ref 1.7–7.7)
Neutrophils Relative %: 71 % (ref 43–77)

## 2010-12-17 LAB — D-DIMER, QUANTITATIVE: D-Dimer, Quant: 3.83 ug/mL-FEU — ABNORMAL HIGH (ref 0.00–0.48)

## 2010-12-17 LAB — CARDIAC PANEL(CRET KIN+CKTOT+MB+TROPI): Relative Index: INVALID (ref 0.0–2.5)

## 2010-12-17 MED ORDER — POTASSIUM CHLORIDE CRYS ER 20 MEQ PO TBCR
40.0000 meq | EXTENDED_RELEASE_TABLET | Freq: Once | ORAL | Status: AC
  Administered 2010-12-17: 40 meq via ORAL
  Filled 2010-12-17: qty 2

## 2010-12-17 MED ORDER — ONDANSETRON HCL 4 MG/2ML IJ SOLN: 4.0000 mg | Freq: Once | INTRAMUSCULAR | Status: DC

## 2010-12-17 MED ORDER — AZITHROMYCIN 250 MG PO TABS: 250.0000 mg | ORAL_TABLET | Freq: Every day | ORAL | Status: AC

## 2010-12-17 MED ORDER — IOHEXOL 350 MG/ML SOLN
100.0000 mL | Freq: Once | INTRAVENOUS | Status: AC | PRN
  Administered 2010-12-17: 100 mL via INTRAVENOUS

## 2010-12-17 MED ORDER — SODIUM CHLORIDE 0.9 % IV SOLN: INTRAVENOUS | Status: DC

## 2010-12-17 MED ORDER — SODIUM CHLORIDE 0.9 % IV SOLN
INTRAVENOUS | Status: DC
  Administered 2010-12-17 (×2): via INTRAVENOUS

## 2010-12-17 NOTE — ED Notes (Signed)
Patient is resting comfortably. Family at bedside. Introduced myself to patient/Family. Patient waiting for cardiology MD consult.

## 2010-12-17 NOTE — ED Notes (Signed)
Received bedside report from [place], [place].  Patient currently sitting up in bed; no respiratory or acute distress noted.  Family present at bedside.  Patient currently waiting to hear about results from [place], [place].  Patient has no questions or concerns at this time.  Will continue to monitor.

## 2010-12-17 NOTE — ED Notes (Signed)
Pt on stretcher, nad noted, abc intact, will monitor pt while awaiting further orders. resp e/u

## 2010-12-17 NOTE — ED Notes (Signed)
Report given to [facility], EMT taking over room

## 2010-12-17 NOTE — ED Notes (Signed)
Pt resting with no needs at this time.

## 2010-12-17 NOTE — ED Notes (Signed)
Pt resting quietly with family at bedside.  Pt awaiting CT Angio to assess pearlier chest pain.  Pt pain free at this time.

## 2010-12-17 NOTE — ED Provider Notes (Signed)
7:31 PM Presumed patient care from Dr. Effie Shy. Patient is being observed for fleeting palpitations, chest pain, shortness of breath. Dr. Effie Shy was concerned for a possible pulmonary embolism due to an elevated d-dimer. Results of the CTA are listed below. They were negative so I will be discharging the patient at home. There was an incidental finding of questionable pneumonia. Patient will be sent home with oral antibiotics. These results have been discussed with the patient and her family. She will followup with Dr. Tawanna Cooler this week in regards to her findings in hospital stay.  IMPRESSION:  No evidence of acute pulmonary thromboembolism.  Early right middle lobe airspace disease.  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  Original Report Authenticated By: Donavan Burnet, M.D.   [place], [place] 12/17/10 [phone]

## 2010-12-17 NOTE — ED Notes (Signed)
Pt up ambulatory to the bathroom at this time 

## 2010-12-17 NOTE — ED Notes (Signed)
Taking room over, report received from [place], EMT

## 2010-12-17 NOTE — Telephone Encounter (Signed)
Noted that app cx since in hospital

## 2010-12-17 NOTE — ED Notes (Signed)
Care of patient assuemd, pt is awaiting cardiology consult. Will monitor pt.

## 2010-12-17 NOTE — Telephone Encounter (Signed)
Pt is in hospital and canceled appt for tomorrow

## 2010-12-17 NOTE — ED Provider Notes (Signed)
History     CSN: [id number] Arrival date & time: 12/17/2010  6:22 AM   First MD Initiated Contact with Patient 12/17/10 0720      Chief Complaint  Patient presents with  . Palpitations    (Consider location/radiation/quality/duration/timing/severity/associated sxs/prior treatment) Patient is a 70 y.o. female presenting with palpitations. The history is provided by the patient and the spouse.  Palpitations  This is a recurrent problem. Episode onset: She has been having this problem for several weeks, it is currently under investigation. Episode frequency: It occurs sporadically. The problem has been gradually worsening. Associated with: There is no specific provocation. She occasionally gets nauseated with the discomfort. She has had some very transient chest pain, lasting a few seconds. Episode Length: The palpitations usually last a few seconds, this morning. She had a period of heavy heart beating, followed by 10 minutes of not being able to feel her heartbeat, then it came back. It when she sat up.   She saw her cardiologist, and he applied an external monitor to evaluate palpitations. She does not have a report back yet. She was hospitalized 3 weeks ago for hypokalemia, treated and discharged  Past Medical History  Diagnosis Date  . Hypertension   . OP (osteoporosis)   . GERD (gastroesophageal reflux disease)   . Elevated blood sugar     790.21  . Gout   . Murmur   . Insomnia   . Allergy   . Glaucoma     Past Surgical History  Procedure Date  . Eye surgery     right and left    Family History  Problem Relation Age of Onset  . Pneumonia Mother   . Hypertension Mother   . Heart disease Father     History  Substance Use Topics  . Smoking status: Never Smoker   . Smokeless tobacco: Not on file  . Alcohol Use: No      Review of Systems  Cardiovascular: Positive for palpitations.  All other systems reviewed and are negative.    Allergies  Indomethacin and  Promethazine hcl  Home Medications   Current Outpatient Rx  Name Route Sig Dispense Refill  . ALLOPURINOL 300 MG PO TABS Oral Take 300 mg by mouth daily.      [name] AMLODIPINE BESYLATE 5 MG PO TABS Oral Take 2.5 mg by mouth daily.      [name] BRIMONIDINE TARTRATE 0.15 % OP SOLN Both Eyes Place 1 drop into both eyes at bedtime.      [name] CALCIUM + D PO Oral Take 1 tablet by mouth daily.     . DORZOLAMIDE HCL-TIMOLOL MAL 22.3-6.8 MG/ML OP SOLN Both Eyes Place 1 drop into both eyes 2 (two) times daily.     [name] HYDROCODONE-HOMATROPINE 5-1.5 MG/5ML PO SYRP Oral Take 5 mLs by mouth every 6 (six) hours as needed.      [name] KLOR-CON M20 20 MEQ PO TBCR Oral Take 20 mEq by mouth daily.     [name] LATANOPROST 0.005 % OP SOLN Both Eyes Place 1 drop into both eyes 2 (two) times daily.     [name] ONE-DAILY MULTI VITAMINS PO TABS Oral Take 1 tablet by mouth daily.     . TRAZODONE HCL 50 MG PO TABS Oral Take 50 mg by mouth at bedtime.      [name] ZOLPIDEM TARTRATE 5 MG PO TABS Oral Take 2.5 mg by mouth at bedtime as needed.     [name] AMLODIPINE BESYLATE 10 MG PO TABS       .  ASPIRIN 81 MG PO TBEC Oral Take 81 mg by mouth daily.      [name] BIMATOPROST 0.03 % OP SOLN  1 drop at bedtime.      [name] CALCIUM CARBONATE 200 MG PO CAPS Oral Take 250 mg by mouth 2 (two) times daily with a meal.      . DORZOLAMIDE HCL 2 % OP SOLN  1 drop 3 (three) times daily.      [name] EYE DROPS A/C OP Ophthalmic Apply to eye.      . TRAZODONE HCL 50 MG PO TABS  TAKE 1 TABLET BY MOUTH EVERY DAY 100 tablet 2    BP 127/45  Pulse 70  Temp(Src) 98.7 F (37.1 C) (Oral)  Resp 18  Ht 5' (1.524 m)  SpO2 99%  Physical Exam  Constitutional: She is oriented to person, place, and time. She appears well-developed and well-nourished.  HENT:  Head: Normocephalic and atraumatic.  Eyes: EOM are normal. Pupils are equal, round, and reactive to light.  Neck: Normal range of motion. Neck supple.  Cardiovascular: Normal rate and regular rhythm.   Pulmonary/Chest: Effort normal.    Abdominal: Soft.  Musculoskeletal: Normal range of motion.  Neurological: She is alert and oriented to person, place, and time. She has normal reflexes.  Skin: Skin is warm.  Psychiatric: Her behavior is normal. Judgment and thought content normal.       She is anxious    ED Course  Procedures (including critical care time)  Date: 12/17/2010  Rate: 70  Rhythm: sinus tachycardia  QRS Axis: normal  Intervals: normal  ST/T Wave abnormalities: nonspecific T wave changes  Conduction Disutrbances:none  Narrative Interpretation: PVC PRESeNT  Old EKG Reviewed: changes noted  Labs Reviewed  CBC - Abnormal; Notable for the following:    RBC 3.81 (*)    All other components within normal limits  BASIC METABOLIC PANEL - Abnormal; Notable for the following:    Potassium 3.1 (*)    Glucose, Bld 107 (*)    GFR calc non Af Amer 63 (*)    GFR calc Af Amer 73 (*)    All other components within normal limits  D-DIMER, QUANTITATIVE - Abnormal; Notable for the following:    D-Dimer, Quant 3.83 (*)    All other components within normal limits  DIFFERENTIAL  CARDIAC PANEL(CRET KIN+CKTOT+MB+TROPI)  URINALYSIS, ROUTINE W REFLEX MICROSCOPIC  URINE CULTURE   No results found.   No diagnosis found.    MDM  Nonspecific palpitations: no apparent ACS, pneumonia, metabolic instability. Screening D-Dimer is elevated.         Flint Melter, MD 12/18/10 [phone]

## 2010-12-17 NOTE — ED Notes (Signed)
Diet tray given to patient

## 2010-12-17 NOTE — ED Notes (Signed)
ECG done on arrival and handed to Dr Norlene Campbell

## 2010-12-17 NOTE — ED Notes (Signed)
Pt back from the bathroom and was placed back on the monitor, continuous pulse oximetry and blood pressure cuff by Huntley Dec, RN; pt also provided an urine specimen if needed later

## 2010-12-17 NOTE — ED Notes (Signed)
Nurse to nurse report given to [facility]

## 2010-12-18 ENCOUNTER — Encounter: Payer: Medicare Other | Admitting: Cardiology

## 2010-12-18 ENCOUNTER — Telehealth: Payer: Self-pay | Admitting: Family Medicine

## 2010-12-18 LAB — URINE CULTURE: Culture  Setup Time: 201211051431

## 2010-12-18 NOTE — Telephone Encounter (Signed)
Pt is req to change pcps from Dr Tawanna Cooler to Dr Caryl Never. Pls advise.

## 2010-12-18 NOTE — ED Provider Notes (Signed)
Medical screening examination/treatment/procedure(s) were performed by non-physician practitioner and as supervising physician I was immediately available for consultation/collaboration.  Flint Melter, MD 12/18/10 [phone]

## 2010-12-18 NOTE — Telephone Encounter (Signed)
Fleet Contras I will leave this up to Dr. Caryl Never

## 2010-12-19 NOTE — Telephone Encounter (Signed)
I am not able to take any new Medicare patients at this time.

## 2010-12-19 NOTE — Telephone Encounter (Signed)
Rachel please call.......... Since she would like to switch physicians.  I think it would be best if she started that now.  Please let is know where.  She would like her records transferred to!!!!!!!!!!!!!!!!

## 2010-12-19 NOTE — Telephone Encounter (Signed)
I called pt and notified her that Dr Caryl Never is not taking any new mcr pts at this time. Pt said that she is going to keep her ov with Dr Tawanna Cooler 12/20/10, and will think about what other options are available for future pcp change.

## 2010-12-19 NOTE — Telephone Encounter (Signed)
patient  Is aware 

## 2010-12-20 ENCOUNTER — Ambulatory Visit (INDEPENDENT_AMBULATORY_CARE_PROVIDER_SITE_OTHER): Payer: Medicare Other | Admitting: Family Medicine

## 2010-12-20 ENCOUNTER — Telehealth: Payer: Self-pay | Admitting: *Deleted

## 2010-12-20 ENCOUNTER — Encounter: Payer: Self-pay | Admitting: Family Medicine

## 2010-12-20 DIAGNOSIS — E876 Hypokalemia: Secondary | ICD-10-CM

## 2010-12-20 DIAGNOSIS — R918 Other nonspecific abnormal finding of lung field: Secondary | ICD-10-CM

## 2010-12-20 DIAGNOSIS — K219 Gastro-esophageal reflux disease without esophagitis: Secondary | ICD-10-CM

## 2010-12-20 DIAGNOSIS — R9389 Abnormal findings on diagnostic imaging of other specified body structures: Secondary | ICD-10-CM | POA: Insufficient documentation

## 2010-12-20 DIAGNOSIS — M109 Gout, unspecified: Secondary | ICD-10-CM

## 2010-12-20 LAB — URIC ACID: Uric Acid, Serum: 3.2 mg/dL (ref 2.4–7.0)

## 2010-12-20 LAB — BASIC METABOLIC PANEL
GFR: 72.69 mL/min (ref >60.00)
Potassium: 5.1 mEq/L (ref 3.5–5.1)
Sodium: 142 mEq/L (ref 135–145)

## 2010-12-20 MED ORDER — HYDROCODONE-ACETAMINOPHEN 7.5-750 MG PO TABS: 1.0000 | ORAL_TABLET | Freq: Four times a day (QID) | ORAL | Status: DC | PRN

## 2010-12-20 MED ORDER — PREDNISONE 20 MG PO TABS: ORAL_TABLET | ORAL | Status: DC

## 2010-12-20 NOTE — Progress Notes (Signed)
  Subjective:    Patient ID: Jennifer Green, female    DOB: November 14, 1940, 70 y.o.   MRN: [id number]  HPI [name] is a 70 year old female, nonsmoker, who comes in today following an emergency room visit.  This past Monday.  She went to the emergency room with symptoms of chest congestion.  She had numerous laboratory studies.  A chest x-ray, which showed a right middle lobe abnormality, but no infiltrate.  A CT scan was done, which showed no pulmonary line, however, some scarring in the right middle lobe.  She states that 18 years ago, and she had streptococcal pneumonia and was very ill.  She was given a prescription for a Z-Pak and told she had pneumonia, although there is no clinical or x-ray evidence of pneumonia.  She takes allopurinol 300 mg daily, however, has had a recent flare of her left ankle .  She states she takes her medication on a daily basis.  In the hospital.  Her serum potassium was 3.1.  She's been taking 40 mEq of potassium twice daily since that time.  Will recheck potassium level today.  She had a colonoscopy many years ago by Dr. Sherin Quarry.  Now she is complaining of upper esophageal symptoms.  She states she has to take some Prilosec on a daily basis.  She's had no difficulty swallowing.  She is unhappy with my care, and wishes to change to another physician   Review of Systems General pulmonary review of systems otherwise negative    Objective:   Physical Exam Well-developed well-nourished, female, in no acute distress.  Examination of the lungs is normal       Assessment & Plan:  Abnormality in the right middle lobe appears as, though it's more scarring and infection.  Recommend pulmonary consult.  History of gout now with swelling of left ankle.  Check uric acid level.  Start prednisone.  Reflux esophagitis.  Continue Prilosec one daily GI consult.  Hypokalemia.  Check potassium level today

## 2010-12-20 NOTE — Patient Instructions (Signed)
Take the prednisone as directed to stop the gout.  Vicodin one half tab q.4 h. P.r.n. For severe pain also, elevation and ice.  We will check uric acid level today and call you tomorrow  We will also check a potassium level.  I will put in a request for pulmonary consult for evaluation of the abnormal x-ray.  If you take Prilosec 20 mg twice daily and are still symptomatic, then I would recommend you see your gastroenterologist for further evaluation of the reflux.

## 2010-12-21 ENCOUNTER — Telehealth: Payer: Self-pay | Admitting: *Deleted

## 2010-12-21 DIAGNOSIS — E876 Hypokalemia: Secondary | ICD-10-CM

## 2010-12-21 DIAGNOSIS — M109 Gout, unspecified: Secondary | ICD-10-CM

## 2010-12-21 NOTE — Telephone Encounter (Signed)
Message copied by Trenton Gammon on Fri Dec 21, 2010 12:08 PM ------      Message from: TODD, JEFFREY A      Created: Thu Dec 20, 2010  2:24 PM       Place, uric acid level normal at 3.2........[name] Range of normal is 2.4 to 7.0.......... Finish prednisone, and call Dr. Dareen Piano rheumatologist for further evaluation.            Potassium is back to normal......5.1////////// range of normal is 3.5 to 5.1////////// decrease potassium to one tablet daily

## 2010-12-28 ENCOUNTER — Encounter: Payer: Self-pay | Admitting: Internal Medicine

## 2010-12-28 ENCOUNTER — Ambulatory Visit (INDEPENDENT_AMBULATORY_CARE_PROVIDER_SITE_OTHER): Payer: Medicare Other | Admitting: Internal Medicine

## 2010-12-28 VITALS — BP 116/70 | HR 67 | Temp 98.2°F | Ht 60.0 in | Wt 127.6 lb

## 2010-12-28 DIAGNOSIS — J984 Other disorders of lung: Secondary | ICD-10-CM

## 2010-12-28 DIAGNOSIS — R911 Solitary pulmonary nodule: Secondary | ICD-10-CM

## 2010-12-28 DIAGNOSIS — R05 Cough: Secondary | ICD-10-CM

## 2010-12-28 MED ORDER — FLUTICASONE PROPIONATE 50 MCG/ACT NA SUSP: 2.0000 | Freq: Every day | NASAL | Status: DC

## 2010-12-28 NOTE — Patient Instructions (Signed)
1) Lung spot  - this is in your right lung middle lobe lateral part - likely due to viral infection  - have followup CT chest wihtout contrast in 2-3 months  2) Cough is from sinus drainage, acid reflux, post viral reactive cough All of this is working together to cause cyclical cough/LPR cough  #Sinus drainage  - start antihistamine chlorpheniramine 4mg  tablet 1-2 at night; if this makes you too sleepy or dry cut down - start nasal steroid inhaler 2 squirts each nostril daily as advised; use generic fluticasone #Possible Acid Reflux  - take otc zegerid 20 1 capsule daily on empty stomach   - take diet sheet from [place] - avoid colas, spices, cheeses, spirits, red meats, beer, chocolates, fried foods etc.,   - sleep with head end of bed elevated  - eat small frequent meals  - do not go to bed for 3 hours after last meal #Cyclical cough  - please choose 3 days and observe complete voice rest - no talking or whispering  - at all times there  there is urge to cough, drink water or swallow or sip on throat lozenge #Followup - I will see you in 4-6 weeks.  - any problems call or come sooner

## 2010-12-28 NOTE — Telephone Encounter (Signed)
Opened in error

## 2010-12-28 NOTE — Progress Notes (Signed)
Subjective:    Patient ID: Jennifer Green, female    DOB: 04-25-1940, 70 y.o.   MRN: [id number]  HPI  70 year female. Never ever smoker. No passive smoking. STates went to ER 12/17/10 for palpitations related to low K. Per patient ddimer test was high. REsulted in CT chest: negative for PE but showed RML lateral segment infiltrate. Sent home on zpak. Saw Dr Tawanna Cooler 12/20/10 and then referred here. Of note, on 2 week prednisone for gout started on 12/20/10 by PMD  She reports chronic cough since sept 2012. Dry in quality. Insidious onset but recollects respiratory infection/URI prior to onset. Feels cough has lingered on from the URI. Stable since onset. Unimproved. Moderate in severity. Day time cough. Brought on by talking or drinking something cold. Cough improved by voice rest. Reports associated chronic moderate intensity sinus drainage all life.  Also, reports GERD many years but worse since cervical fusion surgery in April 2012 (Dr Lovell Sheehan). Currently not any Rx for sinus or GERD except occassional allergy pills and zantac prn. On amlodipine but not ACE inhibitor for bp.  No prior hx of asthma.  Cough RSI score is 33. Reports level 2 difficulty swallowing. Level 3 - clearing of throat, post nasal drip/mucus in throat, choking episodes. Level 4 - hoarseness in voice, lump in throat, heart burn and Level 5 - cough after lying down and annoying cough  Review of Systems  Constitutional: Negative for fever and unexpected weight change.  HENT: Positive for ear pain, congestion, rhinorrhea and postnasal drip. Negative for nosebleeds, sore throat, sneezing, trouble swallowing, dental problem and sinus pressure.   Eyes: Negative for redness and itching.  Respiratory: Positive for cough, chest tightness, shortness of breath and wheezing.   Cardiovascular: Positive for palpitations. Negative for leg swelling.  Gastrointestinal: Negative for nausea and vomiting.  Genitourinary: Negative for dysuria.    Musculoskeletal: Positive for joint swelling.  Skin: Negative for rash.  Neurological: Positive for headaches.  Hematological: Does not bruise/bleed easily.  Psychiatric/Behavioral: Negative for dysphoric mood. The patient is not nervous/anxious.        Objective:   Physical Exam  Vitals reviewed. Constitutional: She is oriented to person, place, and time. She appears well-developed and well-nourished. No distress.  HENT:  Head: Normocephalic and atraumatic.  Right Ear: External ear normal.  Left Ear: External ear normal.  Mouth/Throat: Oropharynx is clear and moist. No oropharyngeal exudate.  Eyes: Conjunctivae and EOM are normal. Pupils are equal, round, and reactive to light. Right eye exhibits no discharge. Left eye exhibits no discharge. No scleral icterus.  Neck: Normal range of motion. Neck supple. No JVD present. No tracheal deviation present. No thyromegaly present.  Cardiovascular: Normal rate, regular rhythm, normal heart sounds and intact distal pulses.  Exam reveals no gallop and no friction rub.   No murmur heard. Pulmonary/Chest: Effort normal and breath sounds normal. No respiratory distress. She has no wheezes. She has no rales. She exhibits no tenderness.  Abdominal: Soft. Bowel sounds are normal. She exhibits no distension and no mass. There is no tenderness. There is no rebound and no guarding.  Musculoskeletal: Normal range of motion. She exhibits no edema and no tenderness.  Lymphadenopathy:    She has no cervical adenopathy.  Neurological: She is alert and oriented to person, place, and time. She has normal reflexes. No cranial nerve deficit. She exhibits normal muscle tone. Coordination normal.  Skin: Skin is warm and dry. No rash noted. She is not diaphoretic. No erythema.  No pallor.  Psychiatric: She has a normal mood and affect. Her behavior is normal. Judgment and thought content normal.          Assessment & Plan:

## 2010-12-31 ENCOUNTER — Ambulatory Visit: Payer: Medicare Other | Admitting: Family Medicine

## 2010-12-31 DIAGNOSIS — R05 Cough: Secondary | ICD-10-CM | POA: Insufficient documentation

## 2010-12-31 DIAGNOSIS — R911 Solitary pulmonary nodule: Secondary | ICD-10-CM | POA: Insufficient documentation

## 2010-12-31 NOTE — Assessment & Plan Note (Signed)
2) Cough is from sinus drainage, acid reflux, post viral reactive cough All of this is working together to cause cyclical cough/LPR cough  #Sinus drainage  - start antihistamine chlorpheniramine 4mg  tablet 1-2 at night; if this makes you too sleepy or dry cut down - start nasal steroid inhaler 2 squirts each nostril daily as advised; use generic fluticasone #Possible Acid Reflux  - take otc zegerid 20 1 capsule daily on empty stomach   - take diet sheet from [place] - avoid colas, spices, cheeses, spirits, red meats, beer, chocolates, fried foods etc.,   - sleep with head end of bed elevated  - eat small frequent meals  - do not go to bed for 3 hours after last meal #Cyclical cough  - please choose 3 days and observe complete voice rest - no talking or whispering  - at all times there  there is urge to cough, drink water or swallow or sip on throat lozenge #Followup - I will see you in 4-6 weeks.  - any problems call or come sooner

## 2010-12-31 NOTE — Assessment & Plan Note (Signed)
1) Lung spot  - this is in your right lung middle lobe lateral part - likely due to viral infection  - have followup CT chest wihtout contrast in 2-3 months

## 2011-01-07 ENCOUNTER — Ambulatory Visit (INDEPENDENT_AMBULATORY_CARE_PROVIDER_SITE_OTHER): Payer: Medicare Other | Admitting: Cardiology

## 2011-01-07 ENCOUNTER — Encounter: Payer: Self-pay | Admitting: Cardiology

## 2011-01-07 VITALS — BP 147/83 | HR 75 | Ht 60.0 in | Wt 130.4 lb

## 2011-01-07 DIAGNOSIS — I499 Cardiac arrhythmia, unspecified: Secondary | ICD-10-CM

## 2011-01-07 DIAGNOSIS — I493 Ventricular premature depolarization: Secondary | ICD-10-CM

## 2011-01-07 DIAGNOSIS — E876 Hypokalemia: Secondary | ICD-10-CM

## 2011-01-07 DIAGNOSIS — I4949 Other premature depolarization: Secondary | ICD-10-CM

## 2011-01-07 DIAGNOSIS — I10 Essential (primary) hypertension: Secondary | ICD-10-CM

## 2011-01-07 MED ORDER — ATENOLOL 25 MG PO TABS: 25.0000 mg | ORAL_TABLET | Freq: Every day | ORAL | Status: DC

## 2011-01-07 NOTE — Progress Notes (Signed)
Jennifer Green Date of Birth: [birth year] Medical Record #[id number]  History of Present Illness: Jennifer Green is seen today for followup after hospitalization in early October with palpitations. She had frequent PVCs. She was hypokalemic and also had a low magnesium level. These were repleted with improvement in her ectopy and also improvement in her prolonged QT. She feels well on followup today. She is still having sporadic palpitations. These are described as a hard beat. She has noted that her blood pressure was elevated and she increased her Norvasc to 10 mg daily. She was previously on 10 her reticulocyte but this was discontinued during her hospital stay. On her recent event monitor she was noted to have occasional PACs and PVCs. During her hospital stay she had a normal echocardiogram and stress Myoview study.  Current Outpatient Prescriptions on File Prior to Visit  Medication Sig Dispense Refill  . amLODipine (NORVASC) 5 MG tablet Take 2.5 mg by mouth daily.        . brimonidine (ALPHAGAN) 0.15 % ophthalmic solution Place 1 drop into both eyes at bedtime.        . Calcium Carbonate-Vitamin D (CALCIUM + D PO) Take 1 tablet by mouth daily.       . dorzolamide-timolol (COSOPT) 22.3-6.8 MG/ML ophthalmic solution Place 1 drop into both eyes 2 (two) times daily.       . fluticasone (FLONASE) 50 MCG/ACT nasal spray Place 2 sprays into the nose daily.  16 g  6  . KLOR-CON M20 20 MEQ tablet Take 60 mEq by mouth daily.       [name] latanoprost (XALATAN) 0.005 % ophthalmic solution Place 1 drop into both eyes 2 (two) times daily.       . Multiple Vitamins-Iron (MULTIVITAMINS WITH IRON) TABS Take 1 tablet by mouth daily.        . traZODone (DESYREL) 50 MG tablet TAKE 1 TABLET BY MOUTH EVERY DAY  100 tablet  2    Allergies  Allergen Reactions  . Indomethacin     Rapid Heart Beat  . Promethazine Hcl Other (See Comments)    Couldn't stay still    Past Medical History  Diagnosis Date  .  Hypertension   . OP (osteoporosis)   . GERD (gastroesophageal reflux disease)   . Elevated blood sugar     790.21  . Gout   . Murmur   . Insomnia   . Allergy   . Glaucoma   . PVC (premature ventricular contraction)   . Hypokalemia   . Hypomagnesemia     Past Surgical History  Procedure Date  . Eye surgery     right and left  . Cervical fusion 05/2010    History  Smoking status  . Never Smoker   Smokeless tobacco  . Not on file    History  Alcohol Use No    Family History  Problem Relation Age of Onset  . Pneumonia Mother   . Hypertension Mother   . Heart disease Father     Review of Systems: As noted in history of present illness..  All other systems were reviewed and are negative.  Physical Exam: BP 147/83  Pulse 75  Ht 5' (1.524 m)  Wt 130 lb 6.4 oz (59.149 kg)  BMI 25.47 kg/m2 The patient is alert and oriented x 3.  The mood and affect are normal.  The skin is warm and dry.  Color is normal.  The HEENT exam reveals that the sclera are nonicteric.  The mucous membranes are moist.  The carotids are 2+ without bruits.  There is no thyromegaly.  There is no JVD.  The lungs are clear.  The chest wall is non tender.  The heart exam reveals a regular rate with a normal S1 and S2.  There are no murmurs, gallops, or rubs.  The PMI is not displaced.   Abdominal exam reveals good bowel sounds.  There is no guarding or rebound.  There is no hepatosplenomegaly or tenderness.  There are no masses.  Exam of the legs reveal no clubbing, cyanosis, or edema.  The legs are without rashes.  The distal pulses are intact.  Cranial nerves II - XII are intact.  Motor and sensory functions are intact.  The gait is normal.  LABORATORY DATA:   Assessment / Plan:

## 2011-01-07 NOTE — Assessment & Plan Note (Signed)
Occasional PACs and PVCs were noted on her vent monitor. We will resume her atenolol at 25 mg per day. We will followup on her electrolyte levels in 4 weeks. I will see her back in 6 months for followup.

## 2011-01-07 NOTE — Assessment & Plan Note (Signed)
Blood pressures been mildly elevated. I have recommended continuing on her 10 mg of amlodipine and we will add back atenolol 25 mg per day. She will need to avoid diuretic therapy.

## 2011-01-07 NOTE — Patient Instructions (Signed)
Stop taking potassium. I would recheck your potassium and magnesium levels in 4 weeks.  Start Atenolol 25 mg daily.  Continue amlodipine 10 mg daily.  I will see you again in 6 months.

## 2011-01-07 NOTE — Assessment & Plan Note (Signed)
Recent potassium level was 5.1. I recommended she stop her potassium at this time. We will repeat a potassium and magnesium level in 4 weeks. Hopefully her low electrolytes were related to her diuretic therapy.

## 2011-01-09 ENCOUNTER — Other Ambulatory Visit: Payer: Self-pay | Admitting: Family Medicine

## 2011-01-09 DIAGNOSIS — Z1231 Encounter for screening mammogram for malignant neoplasm of breast: Secondary | ICD-10-CM

## 2011-01-09 MED ORDER — ATENOLOL 25 MG PO TABS: 25.0000 mg | ORAL_TABLET | Freq: Every day | ORAL | Status: DC

## 2011-01-09 NOTE — Progress Notes (Signed)
Addended by: Royanne Foots on: 01/09/2011 10:40 AM   Modules accepted: Orders

## 2011-01-11 ENCOUNTER — Ambulatory Visit (HOSPITAL_COMMUNITY)
Admission: RE | Admit: 2011-01-11 | Discharge: 2011-01-11 | Disposition: A | Discharge status: Home / Self Care | Payer: Medicare Other | Source: Ambulatory Visit | Attending: Family Medicine | Admitting: Family Medicine

## 2011-01-11 DIAGNOSIS — Z1231 Encounter for screening mammogram for malignant neoplasm of breast: Secondary | ICD-10-CM

## 2011-01-30 ENCOUNTER — Encounter: Payer: Self-pay | Admitting: Internal Medicine

## 2011-01-30 ENCOUNTER — Ambulatory Visit (INDEPENDENT_AMBULATORY_CARE_PROVIDER_SITE_OTHER): Payer: Medicare Other | Admitting: Internal Medicine

## 2011-01-30 VITALS — BP 122/62 | HR 60 | Temp 98.9°F | Ht 60.0 in | Wt 130.6 lb

## 2011-01-30 DIAGNOSIS — R05 Cough: Secondary | ICD-10-CM

## 2011-01-30 DIAGNOSIS — R059 Cough, unspecified: Secondary | ICD-10-CM

## 2011-01-30 MED ORDER — OMEPRAZOLE-SODIUM BICARBONATE 20-1100 MG PO CAPS: 1.0000 | ORAL_CAPSULE | Freq: Every day | ORAL | Status: DC

## 2011-01-30 MED ORDER — RANITIDINE HCL 300 MG PO TABS: 300.0000 mg | ORAL_TABLET | Freq: Every day | ORAL | Status: DC

## 2011-01-30 NOTE — Patient Instructions (Signed)
1) Lung spot  - this is in your right lung middle lobe lateral part - likely due to viral infection  - have followup CT chest wihtout contrast in 2-3 months from mid nov 2012: nurse will ensure that this order is in computer   2) Cough is from sinus drainage, acid reflux, post viral reactive cough All of this is working together to cause cyclical cough/LPR cough  #Sinus drainage  - continue antihistamine chlorpheniramine 4mg  tablet 1-2 at night; if this makes you too sleepy or dry cut down - continuet nasal steroid inhaler 2 squirts each nostril daily as advised; use generic fluticasone #Possible Acid Reflux  - otc zegerid 20mg  1 capsule daily on empty stomach; nurse will do a prescription to see if we can get this cheaper for you. Call if not   - continue diet sheet from [place] - avoid colas, spices, cheeses, spirits, red meats, beer, chocolates, fried foods etc., . Diet is the most important thing and acid reflux control - ADD OTC Zantac 300mg  at night  - sleep with head end of bed elevated  - eat small frequent meals  - do not go to bed for 3 hours after last meal #Cyclical cough  - please choose 3 days and observe complete voice rest - no talking or whispering. Really importantly do this  - at all times there  there is urge to cough, drink water or swallow or sip on throat lozenge #Followup - I will see you after CT scan of the chest - Please keep in mind that following all the above advice 100% is really important in getting rid of  cough - any problems call or come sooner

## 2011-01-30 NOTE — Progress Notes (Signed)
Subjective:    Patient ID: Jennifer Green, female    DOB: Jun 03, 1940, 70 y.o.   MRN: [id number]  HPI IOV 12/28/10: 70 year female. Never ever smoker. No passive smoking. STates went to ER 12/17/10 for palpitations related to low K. Per patient ddimer test was high. REsulted in CT chest: negative for PE but showed RML lateral segment infiltrate. Sent home on zpak. Saw Dr Tawanna Cooler 12/20/10 and then referred here. Of note, on 2 week prednisone for gout started on 12/20/10 by PMD  She reports chronic cough since sept 2012. Dry in quality. Insidious onset but recollects respiratory infection/URI prior to onset. Feels cough has lingered on from the URI. Stable since onset. Unimproved. Moderate in severity. Day time cough. Brought on by talking or drinking something cold. Cough improved by voice rest. Reports associated chronic moderate intensity sinus drainage all life.  Also, reports GERD many years but worse since cervical fusion surgery in April 2012 (Dr Lovell Sheehan). Currently not any Rx for sinus or GERD except occassional allergy pills and zantac prn. On amlodipine but not ACE inhibitor for bp.  No prior hx of asthma.  Cough RSI score is 33. Reports level 2 difficulty swallowing. Level 3 - clearing of throat, post nasal drip/mucus in throat, choking episodes. Level 4 - hoarseness in voice, lump in throat, heart burn and Level 5 - cough after lying down and annoying cough  REC 1) Lung spot  - this is in your right lung middle lobe lateral part  - likely due to viral infection  - have followup CT chest wihtout contrast in 2-3 months   2) Cough is from sinus drainage, acid reflux, post viral reactive cough  All of this is working together to cause cyclical cough/LPR cough  #Sinus drainage  - start antihistamine chlorpheniramine 4mg  tablet 1-2 at night; if this makes you too sleepy or dry cut down  - start nasal steroid inhaler 2 squirts each nostril daily as advised; use generic fluticasone  #Possible Acid  Reflux  - take otc zegerid 20mg  x  1 capsule daily on empty stomach  - take diet sheet from [place] - avoid colas, spices, cheeses, spirits, red meats, beer, chocolates, fried foods etc.,  - sleep with head end of bed elevated  - eat small frequent meals  - do not go to bed for 3 hours after last meal  #Cyclical cough  - please choose 3 days and observe complete voice rest - no talking or whispering  - at all times there there is urge to cough, drink water or swallow or sip on throat lozenge  #Followup  - I will see you in 4-6 weeks.  - any problems call or come sooner   OV 01/30/2011  Followup for chronic cough. RSI score is 14 and this is a greater than 50% improvement in her cough. She currently reports level II hoarseness of voice him a clearing of throat, sinus drainage, cough after lying down, troublesome cough, sensation of lump in throat and heartburn.. subjectively she feels 75% better and states that sinus drainage control and acid reflux control has significantly helped. There is some cost issues with Zegerid and we can try to help her out. In addition she maintains that her diet compliance is not all that great and she still has acid reflux symptoms especially at bedtime. We resolve that we would add ranitidine at night for this.  Past, Family, Social reviewed: no change since last visit. CT scan followup pending  Review of Systems  Constitutional: Negative for fever and unexpected weight change.  HENT: Negative for ear pain, nosebleeds, congestion, sore throat, rhinorrhea, sneezing, trouble swallowing, dental problem, postnasal drip and sinus pressure.   Eyes: Negative for redness and itching.  Respiratory: Positive for cough. Negative for chest tightness, shortness of breath and wheezing.   Cardiovascular: Negative for palpitations and leg swelling.  Gastrointestinal: Negative for nausea and vomiting.  Genitourinary: Negative for dysuria.  Musculoskeletal: Negative for joint  swelling.  Skin: Negative for rash.  Neurological: Negative for headaches.  Hematological: Does not bruise/bleed easily.  Psychiatric/Behavioral: Negative for dysphoric mood. The patient is not nervous/anxious.        Objective:   Physical Exam Vitals reviewed. Constitutional: She is oriented to person, place, and time. She appears well-developed and well-nourished. No distress.  HENT:  Head: Normocephalic and atraumatic.  Right Ear: External ear normal.  Left Ear: External ear normal.  Mouth/Throat: Oropharynx is clear and moist. No oropharyngeal exudate.  Eyes: Conjunctivae and EOM are normal. Pupils are equal, round, and reactive to light. Right eye exhibits no discharge. Left eye exhibits no discharge. No scleral icterus.  Neck: Normal range of motion. Neck supple. No JVD present. No tracheal deviation present. No thyromegaly present.  Cardiovascular: Normal rate, regular rhythm, normal heart sounds and intact distal pulses.  Exam reveals no gallop and no friction rub.   No murmur heard. Pulmonary/Chest: Effort normal and breath sounds normal. No respiratory distress. She has no wheezes. She has no rales. She exhibits no tenderness.  Abdominal: Soft. Bowel sounds are normal. She exhibits no distension and no mass. There is no tenderness. There is no rebound and no guarding.  Musculoskeletal: Normal range of motion. She exhibits no edema and no tenderness.  Lymphadenopathy:    She has no cervical adenopathy.  Neurological: She is alert and oriented to person, place, and time. She has normal reflexes. No cranial nerve deficit. She exhibits normal muscle tone. Coordination normal.  Skin: Skin is warm and dry. No rash noted. She is not diaphoretic. No erythema. No pallor.  Psychiatric: She has a normal mood and affect. Her behavior is normal. Judgment and thought content normal.           Assessment & Plan:

## 2011-01-30 NOTE — Assessment & Plan Note (Signed)
Cough significantly better but it apperas that GERD is still active. Sinus and gerd control is what helped improve cough. WE discussed and opted to further work on gerd control through diet and adding ranitidine at night. I will see her at time of fu for lung nodule and depending on respoonse consider methacholine challenge  > 50% of this 15 min visit spent in face to face counseling

## 2011-02-01 ENCOUNTER — Ambulatory Visit (INDEPENDENT_AMBULATORY_CARE_PROVIDER_SITE_OTHER): Payer: Medicare Other | Admitting: *Deleted

## 2011-02-01 DIAGNOSIS — I10 Essential (primary) hypertension: Secondary | ICD-10-CM

## 2011-02-01 DIAGNOSIS — E785 Hyperlipidemia, unspecified: Secondary | ICD-10-CM

## 2011-02-01 LAB — BASIC METABOLIC PANEL
CO2: 25 mEq/L (ref 19–32)
Chloride: 106 mEq/L (ref 96–112)
GFR: 61.32 mL/min (ref >60.00)
Glucose, Bld: 150 mg/dL — ABNORMAL HIGH (ref 70–99)
Potassium: 3.7 mEq/L (ref 3.5–5.1)
Sodium: 143 mEq/L (ref 135–145)

## 2011-02-08 ENCOUNTER — Telehealth: Payer: Self-pay | Admitting: Internal Medicine

## 2011-02-08 NOTE — Telephone Encounter (Signed)
Member ID #[id number]. [facility] will not cover prescription Zegerid and says pt must try Zegerid OTC. Pt and pharmacy notified. Pt verbalized understanding.

## 2011-02-25 ENCOUNTER — Telehealth: Payer: Self-pay | Admitting: Family Medicine

## 2011-02-25 NOTE — Telephone Encounter (Signed)
Pt has changed pharmacies to [facility]. Pls send all pt scripts to New pharmacy and they will fill scripts as needed. Fax # [phone].

## 2011-02-26 MED ORDER — FLUTICASONE PROPIONATE 50 MCG/ACT NA SUSP: 2.0000 | Freq: Every day | NASAL | Status: DC

## 2011-02-26 MED ORDER — TRAZODONE HCL 50 MG PO TABS: 150.0000 mg | ORAL_TABLET | Freq: Every day | ORAL | Status: DC

## 2011-02-26 MED ORDER — ATENOLOL 25 MG PO TABS: 25.0000 mg | ORAL_TABLET | Freq: Every day | ORAL | Status: DC

## 2011-02-26 MED ORDER — RANITIDINE HCL 300 MG PO TABS: 300.0000 mg | ORAL_TABLET | Freq: Every day | ORAL | Status: DC

## 2011-02-26 MED ORDER — OMEPRAZOLE-SODIUM BICARBONATE 20-1100 MG PO CAPS: 1.0000 | ORAL_CAPSULE | Freq: Every day | ORAL | Status: DC

## 2011-02-26 NOTE — Telephone Encounter (Signed)
rx sent

## 2011-03-22 ENCOUNTER — Ambulatory Visit (INDEPENDENT_AMBULATORY_CARE_PROVIDER_SITE_OTHER)
Admission: RE | Admit: 2011-03-22 | Discharge: 2011-03-22 | Disposition: A | Discharge status: Home / Self Care | Payer: Medicare Other | Source: Ambulatory Visit | Attending: Internal Medicine | Admitting: Internal Medicine

## 2011-03-22 DIAGNOSIS — R911 Solitary pulmonary nodule: Secondary | ICD-10-CM

## 2011-03-26 ENCOUNTER — Ambulatory Visit (INDEPENDENT_AMBULATORY_CARE_PROVIDER_SITE_OTHER): Payer: Medicare Other | Admitting: Internal Medicine

## 2011-03-26 ENCOUNTER — Encounter: Payer: Self-pay | Admitting: Internal Medicine

## 2011-03-26 VITALS — BP 140/74 | HR 63 | Temp 98.7°F | Ht 60.0 in | Wt 130.2 lb

## 2011-03-26 DIAGNOSIS — R05 Cough: Secondary | ICD-10-CM

## 2011-03-26 DIAGNOSIS — R911 Solitary pulmonary nodule: Secondary | ICD-10-CM

## 2011-03-26 DIAGNOSIS — I2510 Atherosclerotic heart disease of native coronary artery without angina pectoris: Secondary | ICD-10-CM

## 2011-03-26 DIAGNOSIS — R053 Chronic cough: Secondary | ICD-10-CM

## 2011-03-26 DIAGNOSIS — M5030 Other cervical disc degeneration, unspecified cervical region: Secondary | ICD-10-CM

## 2011-03-26 DIAGNOSIS — R059 Cough, unspecified: Secondary | ICD-10-CM

## 2011-03-26 NOTE — Progress Notes (Signed)
Subjective:    Patient ID: Jennifer Green, female    DOB: [birth year], 71 y.o.   MRN: [id number]  HPI IOV 12/28/10: 71 year female. Never ever smoker. No passive smoking. STates went to ER 12/17/10 for palpitations related to low K. Per patient ddimer test was high. REsulted in CT chest: negative for PE but showed RML lateral segment infiltrate. Sent home on zpak. Saw Dr Tawanna Cooler 12/20/10 and then referred here. Of note, on 2 week prednisone for gout started on 12/20/10 by PMD  She reports chronic cough since sept 2012. Dry in quality. Insidious onset but recollects respiratory infection/URI prior to onset. Feels cough has lingered on from the URI. Stable since onset. Unimproved. Moderate in severity. Day time cough. Brought on by talking or drinking something cold. Cough improved by voice rest. Reports associated chronic moderate intensity sinus drainage all life.  Also, reports GERD many years but worse since cervical fusion surgery in April 2012 (Dr Lovell Sheehan). Currently not any Rx for sinus or GERD except occassional allergy pills and zantac prn. On amlodipine but not ACE inhibitor for bp.  No prior hx of asthma.  Cough RSI score is 33. Reports level 2 difficulty swallowing. Level 3 - clearing of throat, post nasal drip/mucus in throat, choking episodes. Level 4 - hoarseness in voice, lump in throat, heart burn and Level 5 - cough after lying down and annoying cough  REC 1) Lung spot  - this is in your right lung middle lobe lateral part  - likely due to viral infection  - have followup CT chest wihtout contrast in 2-3 months   2) Cough is from sinus drainage, acid reflux, post viral reactive cough  All of this is working together to cause cyclical cough/LPR cough  #Sinus drainage  - start antihistamine chlorpheniramine 4mg  tablet 1-2 at night; if this makes you too sleepy or dry cut down  - start nasal steroid inhaler 2 squirts each nostril daily as advised; use generic fluticasone  #Possible Acid  Reflux  - take otc zegerid 20mg  x  1 capsule daily on empty stomach  - take diet sheet from [place] - avoid colas, spices, cheeses, spirits, red meats, beer, chocolates, fried foods etc.,  - sleep with head end of bed elevated  - eat small frequent meals  - do not go to bed for 3 hours after last meal  #Cyclical cough  - please choose 3 days and observe complete voice rest - no talking or whispering  - at all times there there is urge to cough, drink water or swallow or sip on throat lozenge  #Followup  - I will see you in 4-6 weeks.  - any problems call or come sooner   OV 01/30/2011  Followup for chronic cough. RSI score is 14 and this is a greater than 50% improvement in her cough. She currently reports level II hoarseness of voice him a clearing of throat, sinus drainage, cough after lying down, troublesome cough, sensation of lump in throat and heartburn.. subjectively she feels 75% better and states that sinus drainage control and acid reflux control has significantly helped. There is some cost issues with Zegerid and we can try to help her out. In addition she maintains that her diet compliance is not all that great and she still has acid reflux symptoms especially at bedtime. We resolved that we would add ranitidine at night for this.    1) Lung spot  - this is in your right lung middle  lobe lateral part  - likely due to viral infection  - have followup CT chest wihtout contrast in 2-3 months from mid nov 2012: nurse will ensure that this order is in computer   2) Cough is from sinus drainage, acid reflux, post viral reactive cough  All of this is working together to cause cyclical cough/LPR cough  #Sinus drainage  - continue antihistamine chlorpheniramine 4mg  tablet 1-2 at night; if this makes you too sleepy or dry cut down  - continuet nasal steroid inhaler 2 squirts each nostril daily as advised; use generic fluticasone  #Possible Acid Reflux  - otc zegerid 20mg  1 capsule daily on  empty stomach; nurse will do a prescription to see if we can get this cheaper for you. Call if not  - continue diet sheet from [place] - avoid colas, spices, cheeses, spirits, red meats, beer, chocolates, fried foods etc., . Diet is the most important thing and acid reflux control  - ADD OTC Zantac 300mg  at night  - sleep with head end of bed elevated  - eat small frequent meals  - do not go to bed for 3 hours after last meal  #Cyclical cough  - please choose 3 days and observe complete voice rest - no talking or whispering. Really importantly do this  - at all times there there is urge to cough, drink water or swallow or sip on throat lozenge  #Followup  - I will see you after CT scan of the chest  - Please keep in mind that following all the above advice 100% is really important in getting rid of cough  - any problems call or come sooner    OV 03/26/2011 #folloup cough and lung nodule  Cough - improved significantly. RSI cough score is 4. There is only level 1 -  Hoarseness, clearing of throat, coughing after lying down, heartburn. CT shows clearance of lung infiltrates. Thre are other unrelated findings on CT chest(see below) and we discussed that.   Ct Chest Wo Contrast  03/22/2011  *RADIOLOGY REPORT*  Clinical Data: Cough.  Weakness.  CT CHEST WITHOUT CONTRAST  Technique:  Multidetector CT imaging of the chest was performed following the standard protocol without IV contrast. High resolution images were also obtained.  Comparison: 12/17/2010  Findings: Scattered small mediastinal nodes are not pathologically enlarged by size criteria.  Coronary artery atherosclerotic calcification in the left anterior descending, left main, circumflex, and right coronary arteries noted.  Low density left adrenal adenoma noted.  Cardiac contour is normal. There is subsegmental atelectasis in both lower lobes and along the minor fissure.  Thoracic spondylosis is present.  This is particularly prominent at the T3-4  level where there is a calcified disc protrusion or less likely anterior calcified meningioma.  No significant large airway thickening noted.  Minimal cylindrical bronchiectasis noted in the left lower lobe.  On expiratory images, there is mosaic attenuation particularly in the lower lobes favoring small airways disease.  IMPRESSION:  1.  Expiratory phase mosaic attenuation in the lower lungs favors a small airways disease such as asthma. 2.  Minimal cylindrical bronchiectasis in the left lower lobe. 3.  Small left adrenal adenoma. 4.  Stable calcified disc protrusion or small anterior meningioma at the T3-4 level of the spinal canal. 5.  Coronary artery atherosclerosis.  Original Report Authenticated By: Dellia Cloud, M.D.   Past, Family, Social reviewed: no change since last visit - she is wondering about change pmd to [address] due to  convenience location   Review of Systems  Constitutional: Negative for fever and unexpected weight change.  HENT: Negative for ear pain, nosebleeds, congestion, sore throat, rhinorrhea, sneezing, trouble swallowing, dental problem, postnasal drip and sinus pressure.   Eyes: Negative for redness and itching.  Respiratory: Negative for cough, chest tightness, shortness of breath and wheezing.   Cardiovascular: Negative for palpitations and leg swelling.  Gastrointestinal: Negative for nausea and vomiting.  Genitourinary: Negative for dysuria.  Musculoskeletal: Negative for joint swelling.  Skin: Negative for rash.  Neurological: Negative for headaches.  Hematological: Does not bruise/bleed easily.  Psychiatric/Behavioral: Negative for dysphoric mood. The patient is not nervous/anxious.        Objective:   Physical Exam  Vitals reviewed. Constitutional: She is oriented to person, place, and time. She appears well-developed and well-nourished. No distress.  HENT:  Head: Normocephalic and atraumatic.  Right Ear: External ear normal.  Left Ear:  External ear normal.  Mouth/Throat: Oropharynx is clear and moist. No oropharyngeal exudate.  Eyes: Conjunctivae and EOM are normal. Pupils are equal, round, and reactive to light. Right eye exhibits no discharge. Left eye exhibits no discharge. No scleral icterus.  Neck: Normal range of motion. Neck supple. No JVD present. No tracheal deviation present. No thyromegaly present.  Cardiovascular: Normal rate, regular rhythm, normal heart sounds and intact distal pulses.  Exam reveals no gallop and no friction rub.   No murmur heard. Pulmonary/Chest: Effort normal and breath sounds normal. No respiratory distress. She has no wheezes. She has no rales. She exhibits no tenderness.  Abdominal: Soft. Bowel sounds are normal. She exhibits no distension and no mass. There is no tenderness. There is no rebound and no guarding.  Musculoskeletal: Normal range of motion. She exhibits no edema and no tenderness.  Lymphadenopathy:    She has no cervical adenopathy.  Neurological: She is alert and oriented to person, place, and time. She has normal reflexes. No cranial nerve deficit. She exhibits normal muscle tone. Coordination normal.  Skin: Skin is warm and dry. No rash noted. She is not diaphoretic. No erythema. No pallor.  Psychiatric: She has a normal mood and affect. Her behavior is normal. Judgment and thought content normal.         Assessment & Plan:

## 2011-03-26 NOTE — Patient Instructions (Addendum)
1) Lung spot  - this appears to have cleared    2) Cough is from sinus drainage, acid reflux, post viral reactive cough All of this is working together to cause cyclical cough/LPR cough WE have adopted to continue same treatment for another 2 months and reassess given excellent control AT followup we can decide on cutting down intensity of treatment  3) CT scan finding of T3 disc  - please discuss this with primary physician  4) CT scan finding of coronary calcification  - I wil contact Dr [place] about this  5) Cough treatment  #Sinus drainage  - continue antihistamine chlorpheniramine 4mg  tablet 1-2 at night; if this makes you too sleepy or dry cut down - continuet nasal steroid inhaler 2 squirts each nostril daily as advised; use generic fluticasone #Possible Acid Reflux  - otc zegerid 20mg  1 capsule daily on empty stomach; nurse will do a prescription to see if we can get this cheaper for you. Call if not   - continue diet sheet from [place] - avoid colas, spices, cheeses, spirits, red meats, beer, chocolates, fried foods etc., .   - Diet is the most important thing and acid reflux control - continue  OTC Zantac 300mg  at night  - sleep with head end of bed elevated  - eat small frequent meals  - do not go to bed for 3 hours after last meal #Cyclical cough  -  - at all times there  there is urge to cough, drink water or swallow or sip on throat lozenge  6) Followup - Return in 2 months

## 2011-03-28 ENCOUNTER — Encounter: Payer: Self-pay | Admitting: Internal Medicine

## 2011-03-28 ENCOUNTER — Telehealth: Payer: Self-pay | Admitting: Internal Medicine

## 2011-03-28 DIAGNOSIS — I2510 Atherosclerotic heart disease of native coronary artery without angina pectoris: Secondary | ICD-10-CM | POA: Insufficient documentation

## 2011-03-28 NOTE — Assessment & Plan Note (Addendum)
Cough is from sinus drainage, acid reflux, post viral reactive cough All of this is working together to cause cyclical cough/LPR cough WE have adopted to continue same treatment for another 2 months and reassess given excellent control AT followup we can decide on cutting down intensity of treatment   #Sinus drainage  - continue antihistamine chlorpheniramine 4mg  tablet 1-2 at night; if this makes you too sleepy or dry cut down - continuet nasal steroid inhaler 2 squirts each nostril daily as advised; use generic fluticasone #Possible Acid Reflux  - otc zegerid 20mg  1 capsule daily on empty stomach; nurse will do a prescription to see if we can get this cheaper for you. Call if not   - continue diet sheet from [place] - avoid colas, spices, cheeses, spirits, red meats, beer, chocolates, fried foods etc., .   - Diet is the most important thing and acid reflux control - continue  OTC Zantac 300mg  at night  - sleep with head end of bed elevated  - eat small frequent meals  - do not go to bed for 3 hours after last meal #Cyclical cough  -  - at all times there  there is urge to cough, drink water or swallow or sip on throat lozenge

## 2011-03-28 NOTE — Telephone Encounter (Signed)
I informed pt of MR's findings and recommendations. Pt verbalized understanding    

## 2011-03-28 NOTE — Assessment & Plan Note (Signed)
I d/w Dr [place] about this on 03/27/11. We reviewed her chart together and noted negative stress test in 2012. So, no further intervention on this per Dr [place]and

## 2011-03-28 NOTE — Assessment & Plan Note (Signed)
Cleared on CT chest  Plan No further followup on this

## 2011-03-28 NOTE — Telephone Encounter (Signed)
Let her know that I d/w Dr [place] about calcium on blood vessels on heart but he said stress test in 2012 was normal. So no futehr workup for it

## 2011-03-28 NOTE — Assessment & Plan Note (Signed)
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 Will let Dr Tawanna Cooler know

## 2011-04-03 ENCOUNTER — Other Ambulatory Visit: Payer: Self-pay | Admitting: Family Medicine

## 2011-04-25 ENCOUNTER — Other Ambulatory Visit (HOSPITAL_COMMUNITY)
Admission: RE | Admit: 2011-04-25 | Discharge: 2011-04-25 | Disposition: A | Discharge status: Home / Self Care | Payer: Medicare Other | Source: Ambulatory Visit | Attending: Family Medicine | Admitting: Family Medicine

## 2011-04-25 ENCOUNTER — Encounter: Payer: Self-pay | Admitting: Family Medicine

## 2011-04-25 ENCOUNTER — Ambulatory Visit (INDEPENDENT_AMBULATORY_CARE_PROVIDER_SITE_OTHER): Payer: Medicare Other | Admitting: Family Medicine

## 2011-04-25 VITALS — BP 124/80 | Temp 97.9°F | Ht 59.25 in | Wt 127.0 lb

## 2011-04-25 DIAGNOSIS — G4700 Insomnia, unspecified: Secondary | ICD-10-CM

## 2011-04-25 DIAGNOSIS — J309 Allergic rhinitis, unspecified: Secondary | ICD-10-CM

## 2011-04-25 DIAGNOSIS — M5030 Other cervical disc degeneration, unspecified cervical region: Secondary | ICD-10-CM

## 2011-04-25 DIAGNOSIS — Z0000 Encounter for general adult medical examination without abnormal findings: Secondary | ICD-10-CM

## 2011-04-25 DIAGNOSIS — Z01419 Encounter for gynecological examination (general) (routine) without abnormal findings: Secondary | ICD-10-CM

## 2011-04-25 DIAGNOSIS — K219 Gastro-esophageal reflux disease without esophagitis: Secondary | ICD-10-CM

## 2011-04-25 DIAGNOSIS — M109 Gout, unspecified: Secondary | ICD-10-CM

## 2011-04-25 DIAGNOSIS — Z124 Encounter for screening for malignant neoplasm of cervix: Secondary | ICD-10-CM | POA: Insufficient documentation

## 2011-04-25 DIAGNOSIS — I10 Essential (primary) hypertension: Secondary | ICD-10-CM

## 2011-04-25 LAB — POCT URINALYSIS DIPSTICK
Bilirubin, UA: NEGATIVE
Glucose, UA: NEGATIVE
Leukocytes, UA: NEGATIVE
Nitrite, UA: NEGATIVE

## 2011-04-25 LAB — BASIC METABOLIC PANEL
Calcium: 9.5 mg/dL (ref 8.4–10.5)
GFR: 60.54 mL/min (ref >60.00)
Potassium: 4.2 mEq/L (ref 3.5–5.1)
Sodium: 141 mEq/L (ref 135–145)

## 2011-04-25 LAB — LIPID PANEL
Cholesterol: 177 mg/dL (ref 0–200)
LDL Cholesterol: 102 mg/dL — ABNORMAL HIGH (ref 0–99)
Triglycerides: 148 mg/dL (ref 0.0–149.0)

## 2011-04-25 LAB — CBC WITH DIFFERENTIAL/PLATELET
Basophils Absolute: 0 10*3/uL (ref 0.0–0.1)
Hemoglobin: 12.3 g/dL (ref 12.0–15.0)
Lymphocytes Relative: 40.5 % (ref 12.0–46.0)
Monocytes Relative: 6.1 % (ref 3.0–12.0)
Neutrophils Relative %: 51.1 % (ref 43.0–77.0)
Platelets: 218 10*3/uL (ref 150.0–400.0)
RDW: 14.5 % (ref 11.5–14.6)

## 2011-04-25 LAB — HEPATIC FUNCTION PANEL
AST: 23 U/L (ref 0–37)
Albumin: 4.2 g/dL (ref 3.5–5.2)
Alkaline Phosphatase: 71 U/L (ref 39–117)
Total Protein: 7 g/dL (ref 6.0–8.3)

## 2011-04-25 MED ORDER — RANITIDINE HCL 300 MG PO TABS
300.0000 mg | ORAL_TABLET | Freq: Every day | ORAL | Status: DC
Start: 2011-04-25 — End: 2012-07-14

## 2011-04-25 MED ORDER — FLUTICASONE PROPIONATE 50 MCG/ACT NA SUSP: 2.0000 | Freq: Every day | NASAL | Status: DC

## 2011-04-25 MED ORDER — TRAZODONE HCL 50 MG PO TABS: ORAL_TABLET | ORAL | Status: DC

## 2011-04-25 MED ORDER — AMLODIPINE BESYLATE 5 MG PO TABS: 5.0000 mg | ORAL_TABLET | Freq: Every day | ORAL | Status: DC

## 2011-04-25 MED ORDER — ALLOPURINOL 300 MG PO TABS: 300.0000 mg | ORAL_TABLET | Freq: Every day | ORAL | Status: DC

## 2011-04-25 MED ORDER — ATENOLOL 25 MG PO TABS: 25.0000 mg | ORAL_TABLET | Freq: Every day | ORAL | Status: DC

## 2011-04-25 NOTE — Progress Notes (Signed)
  Subjective:    Patient ID: Jennifer Green, female    DOB: 1941/01/23, 71 y.o.   MRN: [id number]  HPI Charrise is a 71 year old married female nonsmoker who comes in today for general Medicare wellness examination because of a history of gout, hypertension, allergic rhinitis, reflux esophagitis, sleep dysfunction, and recently a cardiac arrhythmia. This was evaluated in November at the hospital she was found to have low potassium and magnesium. They have self corrected. She takes a magnesium supplement now OTC 3 times weekly and sees her cardiologist Dr. [place]iland twice a year  She gets routine eye care because she has underlying glaucoma every 3 months, regular dental care, hearing normal, BSE monthly, and you mammography, colonoscopy normal, tetanus 2012, Pneumovax x2, tetanus 2004, information given on shingles  Cognitive function normal she walks on a regular basis home health safety reviewed no issues identified, no guns in the house, she does have a health care power of attorney and living will    Review of Systems  Constitutional: Negative.   HENT: Negative.   Eyes: Negative.   Respiratory: Negative.   Cardiovascular: Negative.   Gastrointestinal: Negative.   Genitourinary: Negative.   Musculoskeletal: Negative.   Neurological: Negative.   Hematological: Negative.   Psychiatric/Behavioral: Negative.        Objective:   Physical Exam  Constitutional: She appears well-developed and well-nourished.  HENT:  Head: Normocephalic and atraumatic.  Right Ear: External ear normal.  Left Ear: External ear normal.  Nose: Nose normal.  Mouth/Throat: Oropharynx is clear and moist.  Eyes: EOM are normal. Pupils are equal, round, and reactive to light.  Neck: Normal range of motion. Neck supple. No thyromegaly present.  Cardiovascular: Normal rate, regular rhythm, normal heart sounds and intact distal pulses.  Exam reveals no gallop and no friction rub.   No murmur heard. Pulmonary/Chest:  Effort normal and breath sounds normal.  Abdominal: Soft. Bowel sounds are normal. She exhibits no distension and no mass. There is no tenderness. There is no rebound.  Genitourinary: Vagina normal and uterus normal. Guaiac negative stool. No vaginal discharge found.       Bilateral breast exam normal  Musculoskeletal: Normal range of motion.  Lymphadenopathy:    She has no cervical adenopathy.  Neurological: She is alert. She has normal reflexes. No cranial nerve deficit. She exhibits normal muscle tone. Coordination normal.  Skin: Skin is warm and dry.  Psychiatric: She has a normal mood and affect. Her behavior is normal. Judgment and thought content normal.          Assessment & Plan:  Healthy female  History of gout continue allopurinol  Hypertension continue current meds  Glaucoma followed by ophthalmology  Allergic rhinitis continue OTC Claritin and steroid nasal spray  Reflux esophagitis continue Zantac 300 mg daily  Sleep dysfunction trazodone 50 mg each bedtime when necessary  Cardiac arrhythmia related to low magnesium continue magnesium supplement and aspirin followup in cardiology

## 2011-04-25 NOTE — Patient Instructions (Signed)
Continue your current medications  Return in one year sooner if any problems 

## 2011-07-15 ENCOUNTER — Encounter: Payer: Self-pay | Admitting: Cardiology

## 2011-07-15 ENCOUNTER — Ambulatory Visit (INDEPENDENT_AMBULATORY_CARE_PROVIDER_SITE_OTHER): Payer: Medicare Other | Admitting: Cardiology

## 2011-07-15 VITALS — BP 141/71 | HR 54 | Ht 60.0 in | Wt 126.0 lb

## 2011-07-15 DIAGNOSIS — I4949 Other premature depolarization: Secondary | ICD-10-CM

## 2011-07-15 DIAGNOSIS — I493 Ventricular premature depolarization: Secondary | ICD-10-CM | POA: Insufficient documentation

## 2011-07-15 DIAGNOSIS — I10 Essential (primary) hypertension: Secondary | ICD-10-CM

## 2011-07-15 DIAGNOSIS — R002 Palpitations: Secondary | ICD-10-CM

## 2011-07-15 NOTE — Patient Instructions (Signed)
Continue your current therapy  I will see you again in 1 year.   

## 2011-07-15 NOTE — Progress Notes (Signed)
[name] Date of Birth: 1940-11-26 Medical Record #[id number]  History of Present Illness: Jennifer Green is seen today for followup cc PVCs. Extensive evaluation last October including echocardiogram and stress Myoview study were normal. She states she is doing very well. She has had no real palpitations. She does have some increased cardiac awareness when at rest. She does exercise regularly and has lost 4 pounds since her last visit.  Current Outpatient Prescriptions on File Prior to Visit  Medication Sig Dispense Refill  . allopurinol (ZYLOPRIM) 300 MG tablet Take 1 tablet (300 mg total) by mouth daily.  100 tablet  3  . amLODipine (NORVASC) 5 MG tablet Take 1 tablet (5 mg total) by mouth daily.  100 tablet  3  . aspirin 81 MG tablet Take 81 mg by mouth daily.      [name] atenolol (TENORMIN) 25 MG tablet Take 1 tablet (25 mg total) by mouth daily.  100 tablet  3  . brimonidine (ALPHAGAN) 0.15 % ophthalmic solution Place 1 drop into both eyes 3 (three) times daily.       . Calcium Carbonate-Vitamin D (CALCIUM + D PO) Take 1 tablet by mouth daily.       . dorzolamide-timolol (COSOPT) 22.3-6.8 MG/ML ophthalmic solution Place 1 drop into both eyes 2 (two) times daily.       [name] latanoprost (XALATAN) 0.005 % ophthalmic solution Place 1 drop into both eyes daily.       . Multiple Vitamins-Iron (MULTIVITAMINS WITH IRON) TABS Take 1 tablet by mouth daily.        . pilocarpine (PILOCAR) 0.5 % ophthalmic solution 1-2 drops 2 (two) times daily.       . ranitidine (ZANTAC) 300 MG tablet Take 1 tablet (300 mg total) by mouth at bedtime.  100 tablet  3  . traZODone (DESYREL) 50 MG tablet 1 by mouth each bedtime when necessary  50 tablet  2  . DISCONTD: fluticasone (FLONASE) 50 MCG/ACT nasal spray Place 2 sprays into the nose daily.  16 g  11    Allergies  Allergen Reactions  . Indomethacin     Rapid Heart Beat  . Promethazine Hcl Other (See Comments)    Couldn't stay still    Past Medical History   Diagnosis Date  . Hypertension   . OP (osteoporosis)   . GERD (gastroesophageal reflux disease)   . Elevated blood sugar     790.21  . Gout   . Murmur   . Insomnia   . Allergy   . Glaucoma   . PVC (premature ventricular contraction)   . Hypokalemia   . Hypomagnesemia     Past Surgical History  Procedure Date  . Eye surgery     right and left  . Cervical fusion 05/2010    History  Smoking status  . Never Smoker   Smokeless tobacco  . Not on file    History  Alcohol Use No    Family History  Problem Relation Age of Onset  . Pneumonia Mother   . Hypertension Mother   . Heart disease Father     Review of Systems: As noted in history of present illness..  All other systems were reviewed and are negative.  Physical Exam: BP 141/71  Pulse 54  Ht 5' (1.524 m)  Wt 126 lb (57.153 kg)  BMI 24.61 kg/m2  The HEENT exam is unremarkable. The carotids are 2+ without bruits.  There is no thyromegaly.  There is no  JVD.  The lungs are clear.   The heart exam reveals a regular rate with a normal S1 and S2.  There are no murmurs, gallops, or rubs.  The PMI is not displaced.    Exam of the legs reveal no clubbing, cyanosis, or edema.  The legs are without rashes.  The distal pulses are intact.  Cranial nerves II - XII are intact.  The gait is normal.  LABORATORY DATA: Complete laboratory data was reviewed from March. This included a CBC, chemistry panel, TSH, and lipid panel. Results acceptable.  Assessment / Plan:

## 2011-07-15 NOTE — Assessment & Plan Note (Signed)
She has no significant palpitations at this time. This has improved significantly with correction of her electrolyte abnormalities and low dose beta blocker therapy. We will continue her current therapy and followup again in one year.

## 2011-07-15 NOTE — Assessment & Plan Note (Signed)
Blood pressure control is acceptable. Continue amlodipine and atenolol.

## 2012-01-16 ENCOUNTER — Other Ambulatory Visit: Payer: Self-pay | Admitting: Family Medicine

## 2012-01-16 DIAGNOSIS — Z1231 Encounter for screening mammogram for malignant neoplasm of breast: Secondary | ICD-10-CM

## 2012-02-04 ENCOUNTER — Ambulatory Visit (HOSPITAL_COMMUNITY)
Admission: RE | Admit: 2012-02-04 | Discharge: 2012-02-04 | Disposition: A | Discharge status: Home / Self Care | Payer: Medicare Other | Source: Ambulatory Visit | Attending: Family Medicine | Admitting: Family Medicine

## 2012-02-04 DIAGNOSIS — Z1231 Encounter for screening mammogram for malignant neoplasm of breast: Secondary | ICD-10-CM | POA: Insufficient documentation

## 2012-03-30 ENCOUNTER — Other Ambulatory Visit: Payer: Self-pay | Admitting: Family Medicine

## 2012-04-30 ENCOUNTER — Encounter: Payer: Medicare Other | Admitting: Family Medicine

## 2012-05-10 ENCOUNTER — Other Ambulatory Visit: Payer: Self-pay | Admitting: Family Medicine

## 2012-06-10 ENCOUNTER — Other Ambulatory Visit: Payer: Self-pay | Admitting: Family Medicine

## 2012-06-23 ENCOUNTER — Telehealth: Payer: Self-pay | Admitting: Family Medicine

## 2012-06-23 MED ORDER — AMLODIPINE BESYLATE 5 MG PO TABS: ORAL_TABLET | ORAL | Status: DC

## 2012-06-23 NOTE — Telephone Encounter (Signed)
Pt states [facility] says her amLODipine (NORVASC) 5 MG tablet was denied. (???) Pt would like to know if [facility] could [facility] to [facility] [address].

## 2012-07-14 ENCOUNTER — Ambulatory Visit (INDEPENDENT_AMBULATORY_CARE_PROVIDER_SITE_OTHER): Payer: Medicare Other | Admitting: Family Medicine

## 2012-07-14 ENCOUNTER — Encounter: Payer: Self-pay | Admitting: Family Medicine

## 2012-07-14 VITALS — BP 110/80 | Temp 98.1°F | Ht 60.0 in | Wt 130.0 lb

## 2012-07-14 DIAGNOSIS — Z0000 Encounter for general adult medical examination without abnormal findings: Secondary | ICD-10-CM

## 2012-07-14 DIAGNOSIS — R9389 Abnormal findings on diagnostic imaging of other specified body structures: Secondary | ICD-10-CM

## 2012-07-14 DIAGNOSIS — K219 Gastro-esophageal reflux disease without esophagitis: Secondary | ICD-10-CM

## 2012-07-14 DIAGNOSIS — J309 Allergic rhinitis, unspecified: Secondary | ICD-10-CM

## 2012-07-14 DIAGNOSIS — E785 Hyperlipidemia, unspecified: Secondary | ICD-10-CM

## 2012-07-14 DIAGNOSIS — I493 Ventricular premature depolarization: Secondary | ICD-10-CM

## 2012-07-14 DIAGNOSIS — G4700 Insomnia, unspecified: Secondary | ICD-10-CM

## 2012-07-14 DIAGNOSIS — I10 Essential (primary) hypertension: Secondary | ICD-10-CM

## 2012-07-14 DIAGNOSIS — I4949 Other premature depolarization: Secondary | ICD-10-CM

## 2012-07-14 DIAGNOSIS — R918 Other nonspecific abnormal finding of lung field: Secondary | ICD-10-CM

## 2012-07-14 DIAGNOSIS — M109 Gout, unspecified: Secondary | ICD-10-CM

## 2012-07-14 DIAGNOSIS — I499 Cardiac arrhythmia, unspecified: Secondary | ICD-10-CM

## 2012-07-14 LAB — BASIC METABOLIC PANEL
BUN: 17 mg/dL (ref 6–23)
CO2: 29 mEq/L (ref 19–32)
Calcium: 9.9 mg/dL (ref 8.4–10.5)
GFR: 61.82 mL/min (ref >60.00)
Glucose, Bld: 98 mg/dL (ref 70–99)
Potassium: 5 mEq/L (ref 3.5–5.1)
Sodium: 140 mEq/L (ref 135–145)

## 2012-07-14 LAB — CBC WITH DIFFERENTIAL/PLATELET
Basophils Absolute: 0 10*3/uL (ref 0.0–0.1)
Basophils Relative: 0.3 % (ref 0.0–3.0)
Eosinophils Absolute: 0.1 10*3/uL (ref 0.0–0.7)
HCT: 38.5 % (ref 36.0–46.0)
Hemoglobin: 12.9 g/dL (ref 12.0–15.0)
Lymphs Abs: 2.1 10*3/uL (ref 0.7–4.0)
MCHC: 33.5 g/dL (ref 30.0–36.0)
MCV: 108.7 fl — ABNORMAL HIGH (ref 78.0–100.0)
Neutro Abs: 3.5 10*3/uL (ref 1.4–7.7)
RBC: 3.54 Mil/uL — ABNORMAL LOW (ref 3.87–5.11)
RDW: 15.9 % — ABNORMAL HIGH (ref 11.5–14.6)

## 2012-07-14 LAB — POCT URINALYSIS DIPSTICK
Blood, UA: NEGATIVE
Glucose, UA: NEGATIVE
Nitrite, UA: NEGATIVE
Spec Grav, UA: 1.015
Urobilinogen, UA: 0.2
pH, UA: 8

## 2012-07-14 MED ORDER — ALLOPURINOL 300 MG PO TABS: 300.0000 mg | ORAL_TABLET | Freq: Every day | ORAL | Status: DC

## 2012-07-14 MED ORDER — ATENOLOL 25 MG PO TABS: ORAL_TABLET | ORAL | Status: DC

## 2012-07-14 MED ORDER — RANITIDINE HCL 300 MG PO TABS: 300.0000 mg | ORAL_TABLET | Freq: Every day | ORAL | Status: DC

## 2012-07-14 MED ORDER — TRAZODONE HCL 50 MG PO TABS: ORAL_TABLET | ORAL | Status: DC

## 2012-07-14 NOTE — Progress Notes (Signed)
  Subjective:    Patient ID: Jennifer Green, female    DOB: [birth year], 72 y.o.   MRN: [id number]  HPI Jennifer Green is a delightful 72 year old female nonsmoker who comes in today for a Medicare wellness examination because of a history of gout, hypertension, glaucoma, rheumatoid arthritis........ Treated with methotrexate by Dr. Dareen Piano,,,,,,,,, reflux esophagitis, and insomnia  She gets her eyes checked every 3 months because of glaucoma, regular dental care, BSE monthly, and you mammography, colonoscopy up-to-date, vaccinations up-to-date except we recommend the shingles vaccine  Cognitive function normal she walks on a regular basis and does aerobics, home health safety reviewed no issues identified, no guns in the house, she does have a health care power of attorney and living will   Review of Systems  Constitutional: Negative.   HENT: Negative.   Eyes: Negative.   Respiratory: Negative.   Cardiovascular: Negative.   Gastrointestinal: Negative.   Genitourinary: Negative.   Musculoskeletal: Negative.   Neurological: Negative.   Psychiatric/Behavioral: Negative.        Objective:   Physical Exam  Constitutional: She appears well-developed and well-nourished.  HENT:  Head: Normocephalic and atraumatic.  Right Ear: External ear normal.  Left Ear: External ear normal.  Nose: Nose normal.  Mouth/Throat: Oropharynx is clear and moist.  Cerumen impaction on the right  Eyes: EOM are normal. Pupils are equal, round, and reactive to light.  Neck: Normal range of motion. Neck supple. No thyromegaly present.  Cardiovascular: Normal rate, regular rhythm, normal heart sounds and intact distal pulses.  Exam reveals no gallop and no friction rub.   No murmur heard. No carotid or aortic bruits pulses normal and symmetrical 2+  Rate irregular regular PVCs asymptomatic  Pulmonary/Chest: Effort normal and breath sounds normal.  Abdominal: Soft. Bowel sounds are normal. She exhibits no  distension and no mass. There is no tenderness. There is no rebound.  Genitourinary: Vagina normal and uterus normal.  Bilateral breast exam normal the left nipple is chronically inverted.  Pelvic yearly Pap every 3  Musculoskeletal: Normal range of motion.  Lymphadenopathy:    She has no cervical adenopathy.  Neurological: She is alert. She has normal reflexes. No cranial nerve deficit. She exhibits normal muscle tone. Coordination normal.  Skin: Skin is warm and dry.  Total body skin exam normal except for multiple seborrheic keratosis  Psychiatric: She has a normal mood and affect. Her behavior is normal. Judgment and thought content normal.          Assessment & Plan:  Healthy female  History of gout continue allopurinol  Hypertension BP low 110/80 since she is dieting and exercising stop the Norvasc continue Tenormin 25 mg daily  PVCs asymptomatic  Glaucoma continue followup by ophthalmology  Rheumatoid arthritis continue methotrexate via Dr. Dareen Piano  Reflux esophagitis continue Zantac OTC  Insomnia continue trazodone 50 mg each bedtime  Cerumen impaction right ear home your wax daily at

## 2012-07-14 NOTE — Patient Instructions (Signed)
The recommended dose of Zantac is 150 mg prior to your evening meal to prevent reflux  Stop the Norvasc  Continue the Tenormin 25 mg and check your blood pressure daily in the morning  Blood pressure goal 135/85,,,,,,,,,, if left ear one month your blood pressure still low,,,,,,,,, 110/80 today,,,,,,,,,,,, call and leave a message and I will call you back  Ear wax kit,,,,,,,,,,, 2 drops right ear canal at bedtime for 2 weeks then flushed with warm water  Return in one year for general physical examination sooner if any problems  Call and find out where you can get your shingles vaccine the cheapest

## 2012-07-17 ENCOUNTER — Telehealth: Payer: Self-pay | Admitting: Family Medicine

## 2012-07-17 MED ORDER — ZOSTER VACCINE LIVE 19400 UNT/0.65ML ~~LOC~~ SOLR: 0.6500 mL | Freq: Once | SUBCUTANEOUS | Status: DC

## 2012-07-17 NOTE — Telephone Encounter (Signed)
Rx sent to pharmacy   

## 2012-07-17 NOTE — Telephone Encounter (Signed)
Dr Todd advised pt to go to drug store for Shingles Vaccine. [facility] Can you pls send RX!! [facility] [address] 

## 2012-07-24 ENCOUNTER — Other Ambulatory Visit: Payer: Self-pay | Admitting: Family Medicine

## 2012-08-18 ENCOUNTER — Encounter: Payer: Self-pay | Admitting: Family Medicine

## 2012-08-19 ENCOUNTER — Other Ambulatory Visit: Payer: Self-pay | Admitting: *Deleted

## 2012-08-19 MED ORDER — AMLODIPINE BESYLATE 2.5 MG PO TABS: 2.5000 mg | ORAL_TABLET | Freq: Every day | ORAL | Status: DC

## 2012-10-14 ENCOUNTER — Encounter: Payer: Self-pay | Admitting: Internal Medicine

## 2012-10-14 ENCOUNTER — Ambulatory Visit (INDEPENDENT_AMBULATORY_CARE_PROVIDER_SITE_OTHER): Payer: Medicare Other | Admitting: Internal Medicine

## 2012-10-14 VITALS — BP 130/80 | HR 82 | Temp 98.1°F | Resp 20 | Wt 130.0 lb

## 2012-10-14 DIAGNOSIS — I10 Essential (primary) hypertension: Secondary | ICD-10-CM

## 2012-10-14 DIAGNOSIS — J309 Allergic rhinitis, unspecified: Secondary | ICD-10-CM

## 2012-10-14 DIAGNOSIS — M109 Gout, unspecified: Secondary | ICD-10-CM

## 2012-10-14 DIAGNOSIS — E785 Hyperlipidemia, unspecified: Secondary | ICD-10-CM

## 2012-10-14 NOTE — Patient Instructions (Addendum)
Limit your sodium (Salt) intake  Please check your blood pressure on a regular basis.  If it is consistently greater than 150/90, please make an office appointment.    It is important that you exercise regularly, at least 20 minutes 3 to 4 times per week.  If you develop chest pain or shortness of breath seek  medical attention.  Return in 6 months for follow-up  

## 2012-10-15 NOTE — Progress Notes (Signed)
Subjective:    Patient ID: Jennifer Green, female    DOB: 1940-05-07, 72 y.o.   MRN: [id number]  HPI  72 year old patient who is seen today for general medical followup. Medical problems include treated hypertension. She is on atenolol as well as amlodipine. She has a history mild dyslipidemia and a history of PVCs. She is doing quite well today without concerns or complaints. She has allergic rhinitis also which has been fairly stable.  Past Medical History  Diagnosis Date  . Hypertension   . OP (osteoporosis)   . GERD (gastroesophageal reflux disease)   . Elevated blood sugar     790.21  . Gout   . Murmur   . Insomnia   . Allergy   . Glaucoma   . PVC (premature ventricular contraction)   . Hypokalemia   . Hypomagnesemia     History   Social History  . Marital Status: Married    Spouse Name: N/A    Number of Children: 2  . Years of Education: N/A   Occupational History  . retired     Airline pilot   Social History Main Topics  . Smoking status: Never Smoker   . Smokeless tobacco: Not on file  . Alcohol Use: No  . Drug Use: No  . Sexual Activity:    Other Topics Concern  . Not on file   Social History Narrative  . No narrative on file    Past Surgical History  Procedure Laterality Date  . Eye surgery      right and left  . Cervical fusion  05/2010    Family History  Problem Relation Age of Onset  . Pneumonia Mother   . Hypertension Mother   . Heart disease Father     Allergies  Allergen Reactions  . Indomethacin     Rapid Heart Beat  . Promethazine Hcl Other (See Comments)    Couldn't stay still    Current Outpatient Prescriptions on File Prior to Visit  Medication Sig Dispense Refill  . allopurinol (ZYLOPRIM) 300 MG tablet Take 1 tablet (300 mg total) by mouth daily.  100 tablet  3  . amLODipine (NORVASC) 2.5 MG tablet Take 1 tablet (2.5 mg total) by mouth daily.  90 tablet  0  . atenolol (TENORMIN) 25 MG tablet Take 1 tablet by mouth  daily   90 tablet  3  . atenolol (TENORMIN) 25 MG tablet Take 1 tablet by mouth   daily  90 tablet  0  . brimonidine (ALPHAGAN) 0.15 % ophthalmic solution Place 1 drop into both eyes 3 (three) times daily.       . Calcium Carbonate-Vitamin D (CALCIUM + D PO) Take 1 tablet by mouth daily.       . COMBIGAN 0.2-0.5 % ophthalmic solution Apply 1 drop to eye every 12 (twelve) hours.       . dorzolamide-timolol (COSOPT) 22.3-6.8 MG/ML ophthalmic solution Place 1 drop into both eyes 2 (two) times daily.       [name] latanoprost (XALATAN) 0.005 % ophthalmic solution Place 1 drop into both eyes daily.       . Magnesium 400 MG CAPS Take 400 mg by mouth every other day.      . methotrexate (RHEUMATREX) 2.5 MG tablet       . Multiple Vitamins-Iron (MULTIVITAMINS WITH IRON) TABS Take 1 tablet by mouth daily.        . pilocarpine (PILOCAR) 0.5 % ophthalmic solution 1-2 drops 2 (two) times daily.       [name]  ranitidine (ZANTAC) 300 MG tablet Take 1 tablet (300 mg total) by mouth at bedtime.  100 tablet  3  . traZODone (DESYREL) 50 MG tablet Take 1 tablet by mouth at  bedtime when necessary  90 tablet  3   No current facility-administered medications on file prior to visit.    BP 130/80  Pulse 82  Temp(Src) 98.1 F (36.7 C) (Oral)  Resp 20  Wt 130 lb (58.968 kg)  BMI 25.39 kg/m2  SpO2 98%     Review of Systems  Constitutional: Negative.   HENT: Negative for hearing loss, congestion, sore throat, rhinorrhea, dental problem, sinus pressure and tinnitus.   Eyes: Negative for pain, discharge and visual disturbance.  Respiratory: Negative for cough and shortness of breath.   Cardiovascular: Negative for chest pain, palpitations and leg swelling.  Gastrointestinal: Negative for nausea, vomiting, abdominal pain, diarrhea, constipation, blood in stool and abdominal distention.  Genitourinary: Negative for dysuria, urgency, frequency, hematuria, flank pain, vaginal bleeding, vaginal discharge, difficulty urinating,  vaginal pain and pelvic pain.  Musculoskeletal: Negative for joint swelling, arthralgias and gait problem.  Skin: Negative for rash.  Neurological: Negative for dizziness, syncope, speech difficulty, weakness, numbness and headaches.  Hematological: Negative for adenopathy.  Psychiatric/Behavioral: Negative for behavioral problems, dysphoric mood and agitation. The patient is not nervous/anxious.        Objective:   Physical Exam  Constitutional: She is oriented to person, place, and time. She appears well-developed and well-nourished.  HENT:  Head: Normocephalic.  Right Ear: External ear normal.  Left Ear: External ear normal.  Mouth/Throat: Oropharynx is clear and moist.  Eyes: Conjunctivae and EOM are normal. Pupils are equal, round, and reactive to light.  Neck: Normal range of motion. Neck supple. No thyromegaly present.  Cardiovascular: Normal rate, regular rhythm, normal heart sounds and intact distal pulses.   Pulmonary/Chest: Effort normal and breath sounds normal.  Abdominal: Soft. Bowel sounds are normal. She exhibits no mass. There is no tenderness.  Musculoskeletal: Normal range of motion.  Lymphadenopathy:    She has no cervical adenopathy.  Neurological: She is alert and oriented to person, place, and time.  Skin: Skin is warm and dry. No rash noted.  Psychiatric: She has a normal mood and affect. Her behavior is normal.          Assessment & Plan:   Hypertension. Stable we'll continue present blood pressure regimen. Allergic rhinitis stable History gout mild dyslipidemia.  Home blood pressure monitoring encouraged Exercise regimen encouraged Return office visit 6 months

## 2012-10-20 ENCOUNTER — Ambulatory Visit: Payer: Medicare Other | Admitting: Family Medicine

## 2012-11-21 ENCOUNTER — Other Ambulatory Visit: Payer: Self-pay | Admitting: Family Medicine

## 2012-12-17 ENCOUNTER — Other Ambulatory Visit: Payer: Self-pay

## 2012-12-21 ENCOUNTER — Other Ambulatory Visit: Payer: Self-pay | Admitting: Family Medicine

## 2012-12-22 ENCOUNTER — Telehealth: Payer: Self-pay | Admitting: Family Medicine

## 2012-12-22 ENCOUNTER — Encounter: Payer: Self-pay | Admitting: Family Medicine

## 2012-12-22 ENCOUNTER — Ambulatory Visit (INDEPENDENT_AMBULATORY_CARE_PROVIDER_SITE_OTHER): Payer: Medicare Other | Admitting: Family Medicine

## 2012-12-22 VITALS — BP 120/80 | Temp 98.0°F | Wt 130.0 lb

## 2012-12-22 DIAGNOSIS — J309 Allergic rhinitis, unspecified: Secondary | ICD-10-CM

## 2012-12-22 MED ORDER — PREDNISONE 20 MG PO TABS: ORAL_TABLET | ORAL | Status: DC

## 2012-12-22 NOTE — Telephone Encounter (Signed)
Pt needs appt today. 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 Pt wears hearing aid

## 2012-12-22 NOTE — Progress Notes (Signed)
  Subjective:    Patient ID: Jennifer Green, female    DOB: 09-18-1940, 72 y.o.   MRN: [id number]  HPI Jennifer Green is a 72 year old female nonsmoker who comes in today for evaluation of allergic rhinitis with hearing loss bilaterally  States over the weekend she developed long head congestion and feels like his fluid under years and now she can't hear.    Review of Systems    review of systems negative Objective:   Physical Exam  Well-developed well-nourished female no acute distress HEENT negative neck was supple no adenopathy lungs are clear      Assessment & Plan:  Allergic rhinitis plan prednisone burst and taper

## 2012-12-22 NOTE — Telephone Encounter (Signed)
Spoke with pharmacist  

## 2012-12-22 NOTE — Telephone Encounter (Signed)
Directions for predniSONE (DELTASONE) 20 MG tablet do not add up for the quanity. Need clarification. Pt is on the way to pharm

## 2012-12-22 NOTE — Patient Instructions (Signed)
Afrin nasal spray,,,,,,,,,,,,, one-shot each nostril at bedtime for 5 nights then stop  Take the prednisone as directed  Return when necessary

## 2012-12-22 NOTE — Telephone Encounter (Signed)
done

## 2012-12-22 NOTE — Progress Notes (Signed)
Pre visit review using our clinic review tool, if applicable. No additional management support is needed unless otherwise documented below in the visit note. 

## 2012-12-22 NOTE — Telephone Encounter (Signed)
Spoke with patient she has no fever and little pain but is complaining of pressure in her head.  Please schedule patient for 2:15 this afternoon.  Patient is aware of appointment.

## 2013-02-23 ENCOUNTER — Ambulatory Visit (INDEPENDENT_AMBULATORY_CARE_PROVIDER_SITE_OTHER)
Admission: RE | Admit: 2013-02-23 | Discharge: 2013-02-23 | Disposition: A | Discharge status: Home / Self Care | Payer: Medicare Other | Source: Ambulatory Visit | Attending: Family Medicine | Admitting: Family Medicine

## 2013-02-23 ENCOUNTER — Telehealth: Payer: Self-pay | Admitting: Family Medicine

## 2013-02-23 ENCOUNTER — Encounter: Payer: Self-pay | Admitting: Family Medicine

## 2013-02-23 ENCOUNTER — Ambulatory Visit (INDEPENDENT_AMBULATORY_CARE_PROVIDER_SITE_OTHER): Payer: Medicare Other | Admitting: Family Medicine

## 2013-02-23 VITALS — BP 120/80 | Temp 99.3°F | Wt 131.0 lb

## 2013-02-23 DIAGNOSIS — R05 Cough: Secondary | ICD-10-CM

## 2013-02-23 DIAGNOSIS — J309 Allergic rhinitis, unspecified: Secondary | ICD-10-CM

## 2013-02-23 DIAGNOSIS — R042 Hemoptysis: Secondary | ICD-10-CM | POA: Insufficient documentation

## 2013-02-23 DIAGNOSIS — R059 Cough, unspecified: Secondary | ICD-10-CM

## 2013-02-23 DIAGNOSIS — R053 Chronic cough: Secondary | ICD-10-CM

## 2013-02-23 MED ORDER — HYDROCODONE-HOMATROPINE 5-1.5 MG/5ML PO SYRP: 5.0000 mL | ORAL_SOLUTION | Freq: Three times a day (TID) | ORAL | Status: DC | PRN

## 2013-02-23 MED ORDER — PREDNISONE 20 MG PO TABS: ORAL_TABLET | ORAL | Status: DC

## 2013-02-23 NOTE — Telephone Encounter (Signed)
Patient Information:  Caller Name: Jennifer Green  Phone: [phone]  Patient: Jennifer Green  Gender: Female  DOB: Aug 19, 1940  Age: 73 Years  PCP: Stevie Kern [facility])  Office Follow Up:  Does the office need to follow up with this patient?: No  Instructions For The Office: N/A  RN Note:  Spoke with Derinda Late in office - Appt 12 noon with Dr Sherren Mocha.  Symptoms  Reason For Call & Symptoms: Cough, congestiion started  a few days after starting Prednisone on 11/11.  Has been coughing now for 7 weeks.  Notes laryngitis, coughing until vomiting  at times, coughing up blood each morning.  Pressure/heaviness in chest when up, pain in back and under shoulder blades when lying down.  Hurts to breathe, can hear wheeze in cough.  Reviewed Health History In EMR: Yes  Reviewed Medications In EMR: Yes  Reviewed Allergies In EMR: Yes  Reviewed Surgeries / Procedures: Yes  Date of Onset of Symptoms: Unknown  Treatments Tried: Delsym  Treatments Tried Worked: No  Guideline(s) Used:  Cough  Disposition Per Guideline:   Go to ED Now  Reason For Disposition Reached:   Chest pain present when not coughing  Advice Given:  N/A  RN Overrode Recommendation:  Make Appointment  Spoke with Derinda Late in office - Appt scheduled at noon - Approximately 20 minutes from now.  Patient on way to office.  Appointment Scheduled:  02/23/2013 12:00:00 Appointment Scheduled Provider:  Stevie Kern [facility])

## 2013-02-23 NOTE — Progress Notes (Signed)
   Subjective:    Patient ID: Jennifer Green, female    DOB: 21-Nov-1940, 73 y.o.   MRN: [id number]  HPI Jennifer Green is a 73 year old female nonsmoker who comes in today with a 7 week history of coughing and a 2 week history of hemoptysis  She states she had a cold about 7 weeks ago with head congestion and then it moved into her chest. She's been coughing ever since. 2 weeks ago she began having hemoptysis in the morning. She also has some sensations of shortness of breath. She's had no weight loss. She has had some back pain that's been constant in the right subscapular area.   Review of Systems Review of systems otherwise negative except she's had a history of allergic rhinitis and asthma in the past    Objective:   Physical Exam  Well-developed well nourished in female no acute distress HEENT negative neck was supple no adenopathy lungs are clear except for some mild symmetrical late expiratory wheezing on forced expiration      Assessment & Plan:  Cough x7 weeks with hemoptysis.........[name] begin workup with chest x-ray

## 2013-02-23 NOTE — Progress Notes (Signed)
Pre visit review using our clinic review tool, if applicable. No additional management support is needed unless otherwise documented below in the visit note. 

## 2013-02-23 NOTE — Patient Instructions (Addendum)
Go directly to the main office for your chest x-ray  When I get the report I will call you and then we will discuss your treatment options  Your chest x-ray showed no acute abnormality therefore think your cough is related to the wheezing. Take the prednisone as directed cough syrup as needed return when necessary

## 2013-03-10 ENCOUNTER — Other Ambulatory Visit: Payer: Self-pay | Admitting: Family Medicine

## 2013-03-10 DIAGNOSIS — Z1231 Encounter for screening mammogram for malignant neoplasm of breast: Secondary | ICD-10-CM

## 2013-03-11 ENCOUNTER — Ambulatory Visit (HOSPITAL_COMMUNITY)
Admission: RE | Admit: 2013-03-11 | Discharge: 2013-03-11 | Disposition: A | Discharge status: Home / Self Care | Payer: Medicare Other | Source: Ambulatory Visit | Attending: Family Medicine | Admitting: Family Medicine

## 2013-03-11 DIAGNOSIS — Z1231 Encounter for screening mammogram for malignant neoplasm of breast: Secondary | ICD-10-CM | POA: Insufficient documentation

## 2013-03-16 ENCOUNTER — Ambulatory Visit: Payer: Medicare Other | Admitting: Cardiology

## 2013-03-19 ENCOUNTER — Telehealth: Payer: Self-pay | Admitting: Internal Medicine

## 2013-03-19 NOTE — Telephone Encounter (Signed)
Error.Jennifer Green ° °

## 2013-03-23 ENCOUNTER — Encounter: Payer: Self-pay | Admitting: Internal Medicine

## 2013-03-23 ENCOUNTER — Ambulatory Visit (INDEPENDENT_AMBULATORY_CARE_PROVIDER_SITE_OTHER): Payer: Medicare Other | Admitting: Internal Medicine

## 2013-03-23 VITALS — BP 130/80 | HR 78 | Ht 60.0 in | Wt 130.4 lb

## 2013-03-23 DIAGNOSIS — H9190 Unspecified hearing loss, unspecified ear: Secondary | ICD-10-CM

## 2013-03-23 DIAGNOSIS — R05 Cough: Secondary | ICD-10-CM

## 2013-03-23 DIAGNOSIS — I2510 Atherosclerotic heart disease of native coronary artery without angina pectoris: Secondary | ICD-10-CM

## 2013-03-23 DIAGNOSIS — R059 Cough, unspecified: Secondary | ICD-10-CM

## 2013-03-23 DIAGNOSIS — R053 Chronic cough: Secondary | ICD-10-CM

## 2013-03-23 NOTE — Patient Instructions (Signed)
Please have full PFT breathing test Please see ENT (Dr Tilden Dome) for    - chronic cough, nasal drainage   - deafness  Followup    - next several weeks after completing above two

## 2013-03-23 NOTE — Assessment & Plan Note (Signed)
Refer ENT 

## 2013-03-23 NOTE — Assessment & Plan Note (Signed)
Suspect multifactorial cough with sinus drainage, acid reflux and cyclical cough as primary elements. However given recent onset of deafness that trigger the cough suspecting viral labyrinthitis and ongoing postnasal drainage to recommend ENT consult  In addition get pulmonary function test  REview aftger above

## 2013-03-23 NOTE — Progress Notes (Signed)
Subjective:    Patient ID: Jennifer Green, female    DOB: January 20, 1941, 73 y.o.   MRN: [id number]  HPI  IOV 12/28/10: 73 year female. Never ever smoker. No passive smoking. STates went to ER 12/17/10 for palpitations related to low K. Per patient ddimer test was high. REsulted in CT chest: negative for PE but showed RML lateral segment infiltrate. Sent home on zpak. Saw Dr Sherren Mocha 12/20/10 and then referred here. Of note, on 2 week prednisone for gout started on 12/20/10 by PMD  She reports chronic cough since sept 2012. Dry in quality. Insidious onset but recollects respiratory infection/URI prior to onset. Feels cough has lingered on from the URI. Stable since onset. Unimproved. Moderate in severity. Day time cough. Brought on by talking or drinking something cold. Cough improved by voice rest. Reports associated chronic moderate intensity sinus drainage all life.  Also, reports GERD many years but worse since cervical fusion surgery in April 2012 (Dr Arnoldo Morale). Currently not any Rx for sinus or GERD except occassional allergy pills and zantac prn. On amlodipine but not ACE inhibitor for bp.  No prior hx of asthma.  Cough RSI score is 33. Reports level 2 difficulty swallowing. Level 3 - clearing of throat, post nasal drip/mucus in throat, choking episodes. Level 4 - hoarseness in voice, lump in throat, heart burn and Level 5 - cough after lying down and annoying cough  REC 1) Lung spot  - this is in your right lung middle lobe lateral part  - likely due to viral infection  - have followup CT chest wihtout contrast in 2-3 months   2) Cough is from sinus drainage, acid reflux, post viral reactive cough  All of this is working together to cause cyclical cough/LPR cough  #Sinus drainage  - start antihistamine chlorpheniramine 4mg  tablet 1-2 at night; if this makes you too sleepy or dry cut down  - start nasal steroid inhaler 2 squirts each nostril daily as advised; use generic fluticasone  #Possible  Acid Reflux  - take otc zegerid 20mg  x  1 capsule daily on empty stomach  - take diet sheet from [place] - avoid colas, spices, cheeses, spirits, red meats, beer, chocolates, fried foods etc.,  - sleep with head end of bed elevated  - eat small frequent meals  - do not go to bed for 3 hours after last meal  #Cyclical cough  - please choose 3 days and observe complete voice rest - no talking or whispering  - at all times there there is urge to cough, drink water or swallow or sip on throat lozenge  #Followup  - I will see you in 4-6 weeks.  - any problems call or come sooner   OV 01/30/2011  Followup for chronic cough. RSI score is 14 and this is a greater than 50% improvement in her cough. She currently reports level II hoarseness of voice him a clearing of throat, sinus drainage, cough after lying down, troublesome cough, sensation of lump in throat and heartburn.. subjectively she feels 75% better and states that sinus drainage control and acid reflux control has significantly helped. There is some cost issues with Zegerid and we can try to help her out. In addition she maintains that her diet compliance is not all that great and she still has acid reflux symptoms especially at bedtime. We resolved that we would add ranitidine at night for this.    1) Lung spot  - this is in your right  lung middle lobe lateral part  - likely due to viral infection  - have followup CT chest wihtout contrast in 2-3 months from mid nov 2012: nurse will ensure that this order is in computer   2) Cough is from sinus drainage, acid reflux, post viral reactive cough  All of this is working together to cause cyclical cough/LPR cough  #Sinus drainage  - continue antihistamine chlorpheniramine 4mg  tablet 1-2 at night; if this makes you too sleepy or dry cut down  - continuet nasal steroid inhaler 2 squirts each nostril daily as advised; use generic fluticasone  #Possible Acid Reflux  - otc zegerid 20mg  1 capsule  daily on empty stomach; nurse will do a prescription to see if we can get this cheaper for you. Call if not  - continue diet sheet from [place] - avoid colas, spices, cheeses, spirits, red meats, beer, chocolates, fried foods etc., . Diet is the most important thing and acid reflux control  - ADD OTC Zantac 300mg  at night  - sleep with head end of bed elevated  - eat small frequent meals  - do not go to bed for 3 hours after last meal  #Cyclical cough  - please choose 3 days and observe complete voice rest - no talking or whispering. Really importantly do this  - at all times there there is urge to cough, drink water or swallow or sip on throat lozenge  #Followup  - I will see you after CT scan of the chest  - Please keep in mind that following all the above advice 100% is really important in getting rid of cough  - any problems call or come sooner    OV 03/26/2011 #folloup cough and lung nodule  Cough - improved significantly. RSI cough score is 4. There is only level 1 -  Hoarseness, clearing of throat, coughing after lying down, heartburn. CT shows clearance of lung infiltrates. Thre are other unrelated findings on CT chest(see below) and we discussed that.   Ct Chest Wo Contrast  03/22/2011  *RADIOLOGY REPORT*  Clinical Data: Cough.  Weakness.  CT CHEST WITHOUT CONTRAST  Technique:  Multidetector CT imaging of the chest was performed following the standard protocol without IV contrast. High resolution images were also obtained.  Comparison: 12/17/2010  Findings: Scattered small mediastinal nodes are not pathologically enlarged by size criteria.  Coronary artery atherosclerotic calcification in the left anterior descending, left main, circumflex, and right coronary arteries noted.  Low density left adrenal adenoma noted.  Cardiac contour is normal. There is subsegmental atelectasis in both lower lobes and along the minor fissure.  Thoracic spondylosis is present.  This is particularly prominent at  the T3-4 level where there is a calcified disc protrusion or less likely anterior calcified meningioma.  No significant large airway thickening noted.  Minimal cylindrical bronchiectasis noted in the left lower lobe.  On expiratory images, there is mosaic attenuation particularly in the lower lobes favoring small airways disease.  IMPRESSION:  1.  Expiratory phase mosaic attenuation in the lower lungs favors a small airways disease such as asthma. 2.  Minimal cylindrical bronchiectasis in the left lower lobe. 3.  Small left adrenal adenoma. 4.  Stable calcified disc protrusion or small anterior meningioma at the T3-4 level of the spinal canal. 5.  Coronary artery atherosclerosis.  Original Report Authenticated By: Carron Curie, M.D.   Past, Family, Social reviewed: no change since last visit - she is wondering about change pmd to [address]  due to convenience location  REC 1) Lung spot  - this appears to have cleared    2) Cough is from sinus drainage, acid reflux, post viral reactive cough All of this is working together to cause cyclical cough/LPR cough WE have adopted to continue same treatment for another 2 months and reassess given excellent control AT followup we can decide on cutting down intensity of treatment  3) CT scan finding of T3 disc  - please discuss this with primary physician  4) CT scan finding of coronary calcification  - I wil contact Dr [place] about this  5) Cough treatment  #Sinus drainage  - continue antihistamine chlorpheniramine 4mg  tablet 1-2 at night; if this makes you too sleepy or dry cut down - continuet nasal steroid inhaler 2 squirts each nostril daily as advised; use generic fluticasone #Possible Acid Reflux  - otc zegerid 20mg  1 capsule daily on empty stomach; nurse will do a prescription to see if we can get this cheaper for you. Call if not   - continue diet sheet from [place] - avoid colas, spices, cheeses, spirits, red meats, beer, chocolates,  fried foods etc., .   - Diet is the most important thing and acid reflux control - continue  OTC Zantac 300mg  at night  - sleep with head end of bed elevated  - eat small frequent meals  - do not go to bed for 3 hours after last meal #Cyclical cough  -  - at all times there  there is urge to cough, drink water or swallow or sip on throat lozenge  6) Followup - Return in 2 months   OV 03/23/2013  Chief Complaint  Patient presents with  . Follow-up    for cough. Nonproductive cough. mid upper chest sore. no chest tightness.    For chronic cough. This lady has a habit of showing up once the year or once every 18 months or so. 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 This was associated with onset of cough that was dry and insidious and associated with chronic postnasal drainage. She saw her primary care was treated with prednisone according to her history with this her left-sided deafness resolved to the right-sided deafness still persisted. However the cough never resolved and this continued to persist. She at some point in time got treated with one more course of prednisone but again this does not help the cough therefore she's here  She has is positive postnasal drainage, clearing of the throat and ticklish sensation in the throat. She is not on treatment for this anymore despite me advising this a few years ago  She continues to have acid reflux despite ranitidine and Zegerid and I believe she might have some breakthrough symptoms periodically  Never had pulmonary function test as of record  Imaging: CXR an 2015 Jan: Clear   Dr Lorenza [place] Reflux Symptom Index (> 13-15 suggestive of LPR cough) 0 -> 5  =  none ->severe problem 03/23/2013   Hoarseness of problem with  voice 5  Clearing  Of Throat 5  Excess throat mucus or feeling of post nasal drip 2  Difficulty swallowing food, liquid or tablets 1  Cough after eating or lying down 5  Breathing difficulties or choking episodes 4  Troublesome or annoying cough 5  Sensation  of something sticking in throat or lump in throat 4  Heartburn, chest pain, indigestion, or stomach acid coming up 5  TOTAL 36     Review of Systems  Constitutional: Negative for fever and unexpected weight change.  HENT: Positive for congestion, ear pain and postnasal drip. Negative for dental problem, nosebleeds, rhinorrhea, sinus pressure, sneezing, sore throat and trouble swallowing.   Eyes: Negative for redness and itching.  Respiratory: Positive for cough, shortness of breath and wheezing. Negative for chest tightness.   Cardiovascular: Positive for chest pain. Negative for palpitations and leg swelling.  Gastrointestinal: Negative for nausea and vomiting.  Genitourinary: Negative for dysuria.  Musculoskeletal: Negative for joint swelling.  Skin: Negative for rash.  Neurological: Negative for headaches.  Hematological: Does not bruise/bleed easily.  Psychiatric/Behavioral: Negative for dysphoric mood. The patient is not nervous/anxious.        Objective:   Physical Exam  Vitals reviewed. Constitutional: She is oriented to person, place, and time. She appears well-developed and well-nourished. No distress.  HENT:  Head: Normocephalic and atraumatic.  Right Ear: External ear normal.  Left Ear: External ear normal.  Mouth/Throat: Oropharynx is clear and moist. No oropharyngeal exudate.  Right sdie deaf Post nasal drainage +  Eyes: Conjunctivae and EOM are normal. Pupils are equal, round, and reactive to light. Right eye exhibits no discharge. Left eye exhibits no discharge. No scleral icterus.  Neck: Normal range of motion. Neck supple. No JVD present. No tracheal deviation present. No thyromegaly present.   Cardiovascular: Normal rate, regular rhythm, normal heart sounds and intact distal pulses.  Exam reveals no gallop and no friction rub.   No murmur heard. Pulmonary/Chest: Effort normal and breath sounds normal. No respiratory distress. She has no wheezes. She has no rales. She exhibits no tenderness.  Abdominal: Soft. Bowel sounds are normal. She exhibits no distension and no mass. There is no tenderness. There is no rebound and no guarding.  Musculoskeletal: Normal range of motion. She exhibits no edema and no tenderness.  Lymphadenopathy:    She has no cervical adenopathy.  Neurological: She is alert and oriented to person, place, and time. She has normal reflexes. No cranial nerve deficit. She exhibits normal muscle tone. Coordination normal.  Skin: Skin is warm and dry. No rash noted. She is not diaphoretic. No erythema. No pallor.  Psychiatric: She has a normal mood and affect. Her behavior is normal. Judgment and thought content normal.          Assessment & Plan:

## 2013-03-25 ENCOUNTER — Encounter: Payer: Self-pay | Admitting: Cardiology

## 2013-03-25 ENCOUNTER — Ambulatory Visit (INDEPENDENT_AMBULATORY_CARE_PROVIDER_SITE_OTHER): Payer: Medicare Other | Admitting: Cardiology

## 2013-03-25 VITALS — BP 140/70 | HR 65 | Ht 60.0 in | Wt 128.4 lb

## 2013-03-25 DIAGNOSIS — I2510 Atherosclerotic heart disease of native coronary artery without angina pectoris: Secondary | ICD-10-CM

## 2013-03-25 DIAGNOSIS — I10 Essential (primary) hypertension: Secondary | ICD-10-CM

## 2013-03-25 DIAGNOSIS — I4949 Other premature depolarization: Secondary | ICD-10-CM

## 2013-03-25 DIAGNOSIS — I493 Ventricular premature depolarization: Secondary | ICD-10-CM

## 2013-03-25 NOTE — Patient Instructions (Signed)
Continue your current therapy  I will see you in one year   

## 2013-03-25 NOTE — Progress Notes (Signed)
Jennifer Green Date of Birth: 01-24-1941 Medical Record #[id number]  History of Present Illness: Jennifer Green is seen today for followup of PVCs. Extensive evaluation last October 2012 including echocardiogram and stress Myoview study were normal. She did have coronary calcification noted on chest CT in 2/13. She states she is doing very well. She does have daily palpitations. Feels her heart skip and pound. Makes her feel weak.  No dizziness or syncope. No chest pain or SOB. She does have a chronic cough and is seeing pulmonary.  Current Outpatient Prescriptions on File Prior to Visit  Medication Sig Dispense Refill  . allopurinol (ZYLOPRIM) 300 MG tablet Take 1 tablet (300 mg total) by mouth daily.  100 tablet  3  . amLODipine (NORVASC) 2.5 MG tablet TAKE 1 TABLET BY MOUTH EVERY DAY  90 tablet  0  . atenolol (TENORMIN) 25 MG tablet Take 1 tablet by mouth  daily  90 tablet  2  . Calcium Carbonate-Vitamin D (CALCIUM + D PO) Take 1 tablet by mouth daily.       [name] latanoprost (XALATAN) 0.005 % ophthalmic solution Place 1 drop into both eyes daily.       . Magnesium 400 MG CAPS Take 400 mg by mouth every other day.      . methotrexate (RHEUMATREX) 2.5 MG tablet Take 2.5 mg by mouth. 6 days a week      . Multiple Vitamins-Iron (MULTIVITAMINS WITH IRON) TABS Take 1 tablet by mouth daily.        . pilocarpine (PILOCAR) 0.5 % ophthalmic solution 1-2 drops 2 (two) times daily.       . ranitidine (ZANTAC) 300 MG tablet Take 1 tablet (300 mg total) by mouth at bedtime.  100 tablet  3  . traZODone (DESYREL) 50 MG tablet Take 1 tablet by mouth at  bedtime when necessary  90 tablet  3   No current facility-administered medications on file prior to visit.    Allergies  Allergen Reactions  . Indomethacin     Rapid Heart Beat  . Promethazine Hcl Other (See Comments)    Couldn't stay still    Past Medical History  Diagnosis Date  . Hypertension   . OP (osteoporosis)   . GERD (gastroesophageal  reflux disease)   . Elevated blood sugar     790.21  . Gout   . Murmur   . Insomnia   . Allergy   . Glaucoma   . PVC (premature ventricular contraction)   . Hypokalemia   . Hypomagnesemia     Past Surgical History  Procedure Laterality Date  . Eye surgery      right and left  . Cervical fusion  05/2010    History  Smoking status  . Never Smoker   Smokeless tobacco  . Not on file    History  Alcohol Use No    Family History  Problem Relation Age of Onset  . Pneumonia Mother   . Hypertension Mother   . Heart disease Father     Review of Systems: As noted in history of present illness..  All other systems were reviewed and are negative.  Physical Exam: BP 140/70  Pulse 65  Ht 5' (1.524 m)  Wt 128 lb 6.4 oz (58.242 kg)  BMI 25.08 kg/m2  SpO2 99%  The HEENT exam is unremarkable. The carotids are 2+ without bruits.  There is no thyromegaly.  There is no JVD.  The lungs are clear.   The  heart exam reveals a regular rate with a normal S1 and S2.  There are no murmurs, gallops, or rubs.  The PMI is not displaced.    Exam of the legs reveal no clubbing, cyanosis, or edema.  The legs are without rashes.  The distal pulses are intact.  Cranial nerves II - XII are intact.  The gait is normal.  LABORATORY DATA: Ecg: 07/14/12 shows NSR with occ. PVC. Nonspecific TWA  Lab Results  Component Value Date   WBC 6.2 07/14/2012   HGB 12.9 07/14/2012   HCT 38.5 07/14/2012   PLT 263.0 07/14/2012   GLUCOSE 98 07/14/2012   CHOL 177 04/25/2011   TRIG 148.0 04/25/2011   HDL 45.40 04/25/2011   LDLDIRECT 142.1 04/25/2006   LDLCALC 102* 04/25/2011   ALT 20 04/25/2011   AST 23 04/25/2011   NA 140 07/14/2012   K 5.0 07/14/2012   CL 105 07/14/2012   CREATININE 0.9 07/14/2012   BUN 17 07/14/2012   CO2 29 07/14/2012   TSH 1.66 07/14/2012   HGBA1C 6.1* 04/25/2006     Assessment / Plan: 1. PVCs chronic and benign. Continue beta blocker therapy  2. Coronary calcification with normal myoview study. No symptoms  of angina. Risk factor modification.

## 2013-04-29 ENCOUNTER — Ambulatory Visit (INDEPENDENT_AMBULATORY_CARE_PROVIDER_SITE_OTHER): Payer: Medicare Other | Admitting: Internal Medicine

## 2013-04-29 ENCOUNTER — Encounter: Payer: Self-pay | Admitting: Internal Medicine

## 2013-04-29 VITALS — BP 128/78 | HR 56 | Ht 59.0 in | Wt 130.0 lb

## 2013-04-29 DIAGNOSIS — R053 Chronic cough: Secondary | ICD-10-CM

## 2013-04-29 DIAGNOSIS — R059 Cough, unspecified: Secondary | ICD-10-CM

## 2013-04-29 DIAGNOSIS — R05 Cough: Secondary | ICD-10-CM

## 2013-04-29 DIAGNOSIS — D329 Benign neoplasm of meninges, unspecified: Secondary | ICD-10-CM

## 2013-04-29 DIAGNOSIS — D320 Benign neoplasm of cerebral meninges: Secondary | ICD-10-CM

## 2013-04-29 DIAGNOSIS — I2510 Atherosclerotic heart disease of native coronary artery without angina pectoris: Secondary | ICD-10-CM

## 2013-04-29 LAB — PULMONARY FUNCTION TEST
DL/VA % PRED: 109 %
DL/VA: 4.48 ml/min/mmHg/L
DLCO unc % pred: 68 %
DLCO unc: 12.04 ml/min/mmHg
FEF 25-75 Post: 1.54 L/sec
FEF 25-75 Pre: 1.53 L/sec
FEF2575-%CHANGE-POST: 0 %
FEF2575-%PRED-POST: 111 %
FEF2575-%Pred-Pre: 111 %
FEV1-%CHANGE-POST: 0 %
FEV1-%PRED-PRE: 98 %
FEV1-%Pred-Post: 98 %
FEV1-POST: 1.62 L
FEV1-Pre: 1.62 L
FEV1FVC-%Change-Post: 11 %
FEV1FVC-%PRED-PRE: 105 %
FEV6-%Change-Post: -4 %
FEV6-%Pred-Post: 86 %
FEV6-%Pred-Pre: 90 %
FEV6-Post: 1.82 L
FEV6-Pre: 1.9 L
FEV6FVC-%Change-Post: 7 %
FEV6FVC-%PRED-PRE: 97 %
FEV6FVC-%Pred-Post: 104 %
FVC-%Change-Post: -10 %
FVC-%Pred-Post: 82 %
FVC-%Pred-Pre: 92 %
FVC-Post: 1.83 L
FVC-Pre: 2.05 L
POST FEV1/FVC RATIO: 89 %
POST FEV6/FVC RATIO: 99 %
Pre FEV1/FVC ratio: 79 %
Pre FEV6/FVC Ratio: 93 %
RV % pred: 64 %
RV: 1.32 L
TLC % PRED: 94 %
TLC: 4.04 L

## 2013-04-29 NOTE — Progress Notes (Signed)
Subjective:    Patient ID: Jennifer Green, female    DOB: [birth year], 73 y.o.   MRN: [id number]  HPI   IOV 12/28/10: 73 year female. Never ever smoker. No passive smoking. STates went to ER 12/17/10 for palpitations related to low K. Per patient ddimer test was high. REsulted in CT chest: negative for PE but showed RML lateral segment infiltrate. Sent home on zpak. Saw Dr Sherren Mocha 12/20/10 and then referred here. Of note, on 2 week prednisone for gout started on 12/20/10 by PMD  She reports chronic cough since sept 2012. Dry in quality. Insidious onset but recollects respiratory infection/URI prior to onset. Feels cough has lingered on from the URI. Stable since onset. Unimproved. Moderate in severity. Day time cough. Brought on by talking or drinking something cold. Cough improved by voice rest. Reports associated chronic moderate intensity sinus drainage all life.  Also, reports GERD many years but worse since cervical fusion surgery in April 2012 (Dr Arnoldo Morale). Currently not any Rx for sinus or GERD except occassional allergy pills and zantac prn. On amlodipine but not ACE inhibitor for bp.  No prior hx of asthma.  Cough RSI score is 33. Reports level 2 difficulty swallowing. Level 3 - clearing of throat, post nasal drip/mucus in throat, choking episodes. Level 4 - hoarseness in voice, lump in throat, heart burn and Level 5 - cough after lying down and annoying cough   OV 01/30/2011  Followup for chronic cough. RSI score is 14 and this is a greater than 50% improvement in her cough. She currently reports level II hoarseness of voice him a clearing of throat, sinus drainage, cough after lying down, troublesome cough, sensation of lump in throat and heartburn.. subjectively she feels 75% better and states that sinus drainage control and acid reflux control has significantly helped. There is some cost issues with Zegerid and we can try to help her out. In addition she maintains that her diet compliance is  not all that great and she still has acid reflux symptoms especially at bedtime. We resolved that we would add ranitidine at night for this.       OV 03/26/2011 #folloup cough and lung nodule  Cough - improved significantly. RSI cough score is 4. There is only level 1 -  Hoarseness, clearing of throat, coughing after lying down, heartburn. CT shows clearance of lung infiltrates. Thre are other unrelated findings on CT chest(see below) and we discussed that.   Ct Chest Wo Contrast  03/22/2011  *RADIOLOGY REPORT*  Clinical Data: Cough.  Weakness.  CT CHEST WITHOUT CONTRAST  Technique:  Multidetector CT imaging of the chest was performed following the standard protocol without IV contrast. High resolution images were also obtained.  Comparison: 12/17/2010  Findings: Scattered small mediastinal nodes are not pathologically enlarged by size criteria.  Coronary artery atherosclerotic calcification in the left anterior descending, left main, circumflex, and right coronary arteries noted.  Low density left adrenal adenoma noted.  Cardiac contour is normal. There is subsegmental atelectasis in both lower lobes and along the minor fissure.  Thoracic spondylosis is present.  This is particularly prominent at the T3-4 level where there is a calcified disc protrusion or less likely anterior calcified meningioma.  No significant large airway thickening noted.  Minimal cylindrical bronchiectasis noted in the left lower lobe.  On expiratory images, there is mosaic attenuation particularly in the lower lobes favoring small airways disease.  IMPRESSION:  1.  Expiratory phase mosaic attenuation in the lower  lungs favors a small airways disease such as asthma. 2.  Minimal cylindrical bronchiectasis in the left lower lobe. 3.  Small left adrenal adenoma. 4.  Stable calcified disc protrusion or small anterior meningioma at the T3-4 level of the spinal canal. 5.  Coronary artery atherosclerosis.  Original Report Authenticated  By: Carron Curie, M.D.    [name] 03/23/2013  Chief Complaint  Patient presents with  . Follow-up    for cough. Nonproductive cough. mid upper chest sore. no chest tightness.    For chronic cough. This lady has a habit of showing up once the year or once every 18 months or so. 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 This was associated with onset of cough that was dry and insidious and associated with chronic postnasal drainage. She saw her primary care was treated with prednisone according to her history with this her left-sided deafness resolved to the right-sided deafness still persisted. However the cough never resolved and this continued to persist. She at some point in time got treated with one more course of prednisone but again this does not help the cough therefore she's here  She has is positive postnasal drainage, clearing of the throat and ticklish sensation in the throat. She is not on treatment for this anymore despite me advising this a few years ago  She continues to have acid reflux despite ranitidine and Zegerid and I believe she might have some breakthrough symptoms periodically  Never had pulmonary function test as of record  Imaging: CXR an 2015 Jan: Clear  REC PFT See ENT - cough, drainage and deafness  OV 04/29/2013  Chief Complaint  Patient presents with  . Follow-up    after ENT visit and PFT.  Pt states that cough is 75% improved.     FU cough  Saw ENT (notes NA) and apparently GERD is considered. PPI changed and dose doubled but this caused rash so she is on half dose. This change has helped. Cough is 75% better.  SHe is continuining sinus, gerd measures. PFTs today are normal except low DLCO 68% but CT chest  [address] did not show ILD  (some mild LLL bronchiectasis only) . She currently denies dyspnea She is happy with her current quality of life     Past hx  - has not seen neurosurgery for meningioma of spine: She says she is unaware thought I told her last OV to d/w PCP TODD,JEFFREY Zenia Resides, MD  - STress myoview of heart is normal.    Dr Lorenza Cambridge Reflux Symptom Index (> 13-15 suggestive of LPR cough)  03/23/2013   Hoarseness of problem with voice 5  Clearing  Of Throat 5  Excess throat mucus or feeling of post nasal drip 2  Difficulty swallowing food, liquid or tablets 1  Cough after eating or lying down 5  Breathing difficulties or choking episodes 4  Troublesome or annoying cough 5  Sensation of something sticking in throat or lump in throat 4  Heartburn, chest pain, indigestion, or stomach acid coming up 5  TOTAL 36     Review of Systems  Constitutional: Negative for fever and unexpected weight change.  HENT: Negative for congestion, dental problem, ear pain, nosebleeds, postnasal drip, rhinorrhea, sinus pressure, sneezing, sore throat and trouble swallowing.   Eyes: Negative for redness  and itching.  Respiratory: Positive for cough. Negative for chest tightness, shortness of breath and wheezing.   Cardiovascular: Negative for palpitations and leg swelling.  Gastrointestinal: Negative for nausea and vomiting.  Genitourinary: Negative for dysuria.  Musculoskeletal: Negative for joint swelling.  Skin: Negative for rash.  Neurological: Negative for headaches.  Hematological: Does not bruise/bleed easily.  Psychiatric/Behavioral: Negative for dysphoric mood. The patient is not nervous/anxious.        Objective:   Physical Exam  Vitals reviewed. Constitutional: She is oriented to person, place, and time. She appears well-developed and well-nourished. No distress.  HENT:  Head: Normocephalic and atraumatic.  Right Ear: External ear normal.  Left Ear: External ear normal.  Mouth/Throat: Oropharynx is  clear and moist. No oropharyngeal exudate.  Eyes: Conjunctivae and EOM are normal. Pupils are equal, round, and reactive to light. Right eye exhibits no discharge. Left eye exhibits no discharge. No scleral icterus.  Neck: Normal range of motion. Neck supple. No JVD present. No tracheal deviation present. No thyromegaly present.  Cardiovascular: Normal rate, regular rhythm, normal heart sounds and intact distal pulses.  Exam reveals no gallop and no friction rub.   No murmur heard. Pulmonary/Chest: Effort normal and breath sounds normal. No respiratory distress. She has no wheezes. She has no rales. She exhibits no tenderness.  Abdominal: Soft. Bowel sounds are normal. She exhibits no distension and no mass. There is no tenderness. There is no rebound and no guarding.  Musculoskeletal: Normal range of motion. She exhibits no edema and no tenderness.  Lymphadenopathy:    She has no cervical adenopathy.  Neurological: She is alert and oriented to person, place, and time. She has normal reflexes. No cranial nerve deficit. She exhibits normal muscle tone. Coordination normal.  Skin: Skin is warm and dry. No rash noted. She is not diaphoretic. No erythema. No pallor.  Psychiatric: She has a normal mood and affect. Her behavior is normal. Judgment and thought content normal.          Assessment & Plan:

## 2013-04-29 NOTE — Assessment & Plan Note (Signed)
 #  Anterior Meningioma T3 spine  - refer neuro -surgery (back to your old neuro surgeon)(

## 2013-04-29 NOTE — Assessment & Plan Note (Signed)
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

## 2013-04-29 NOTE — Patient Instructions (Addendum)
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

## 2013-04-29 NOTE — Progress Notes (Signed)
PFT done today. 

## 2013-05-14 ENCOUNTER — Other Ambulatory Visit: Payer: Self-pay | Admitting: Family Medicine

## 2013-06-16 ENCOUNTER — Other Ambulatory Visit: Payer: Self-pay | Admitting: Family Medicine

## 2013-07-04 ENCOUNTER — Encounter (HOSPITAL_BASED_OUTPATIENT_CLINIC_OR_DEPARTMENT_OTHER): Payer: Self-pay | Admitting: Emergency Medicine

## 2013-07-04 ENCOUNTER — Emergency Department (HOSPITAL_BASED_OUTPATIENT_CLINIC_OR_DEPARTMENT_OTHER): Payer: Medicare Other

## 2013-07-04 ENCOUNTER — Emergency Department (HOSPITAL_BASED_OUTPATIENT_CLINIC_OR_DEPARTMENT_OTHER)
Admission: EM | Admit: 2013-07-04 | Discharge: 2013-07-04 | Disposition: A | Discharge status: Home / Self Care | Payer: Medicare Other | Attending: Emergency Medicine | Admitting: Emergency Medicine

## 2013-07-04 DIAGNOSIS — K219 Gastro-esophageal reflux disease without esophagitis: Secondary | ICD-10-CM | POA: Insufficient documentation

## 2013-07-04 DIAGNOSIS — H409 Unspecified glaucoma: Secondary | ICD-10-CM | POA: Insufficient documentation

## 2013-07-04 DIAGNOSIS — M109 Gout, unspecified: Secondary | ICD-10-CM | POA: Insufficient documentation

## 2013-07-04 DIAGNOSIS — M25539 Pain in unspecified wrist: Secondary | ICD-10-CM | POA: Insufficient documentation

## 2013-07-04 DIAGNOSIS — M810 Age-related osteoporosis without current pathological fracture: Secondary | ICD-10-CM | POA: Insufficient documentation

## 2013-07-04 DIAGNOSIS — Z79899 Other long term (current) drug therapy: Secondary | ICD-10-CM | POA: Insufficient documentation

## 2013-07-04 DIAGNOSIS — I10 Essential (primary) hypertension: Secondary | ICD-10-CM | POA: Insufficient documentation

## 2013-07-04 DIAGNOSIS — R011 Cardiac murmur, unspecified: Secondary | ICD-10-CM | POA: Insufficient documentation

## 2013-07-04 DIAGNOSIS — M79642 Pain in left hand: Secondary | ICD-10-CM

## 2013-07-04 LAB — CBC WITH DIFFERENTIAL/PLATELET
BASOS ABS: 0 10*3/uL (ref 0.0–0.1)
Basophils Relative: 0 % (ref 0–1)
Eosinophils Absolute: 0.1 10*3/uL (ref 0.0–0.7)
Eosinophils Relative: 2 % (ref 0–5)
HCT: 34.4 % — ABNORMAL LOW (ref 36.0–46.0)
HEMOGLOBIN: 12.3 g/dL (ref 12.0–15.0)
LYMPHS ABS: 1.1 10*3/uL (ref 0.7–4.0)
LYMPHS PCT: 23 % (ref 12–46)
MCH: 42.1 pg — ABNORMAL HIGH (ref 26.0–34.0)
MCHC: 35.8 g/dL (ref 30.0–36.0)
MCV: 117.8 fL — ABNORMAL HIGH (ref 78.0–100.0)
MONO ABS: 0.4 10*3/uL (ref 0.1–1.0)
MONOS PCT: 9 % (ref 3–12)
NEUTROS ABS: 3.2 10*3/uL (ref 1.7–7.7)
Neutrophils Relative %: 67 % (ref 43–77)
Platelets: 174 10*3/uL (ref 150–400)
RBC: 2.92 MIL/uL — AB (ref 3.87–5.11)
RDW: 13.7 % (ref 11.5–15.5)
WBC: 4.7 10*3/uL (ref 4.0–10.5)

## 2013-07-04 LAB — BASIC METABOLIC PANEL
BUN: 13 mg/dL (ref 6–23)
CHLORIDE: 103 meq/L (ref 96–112)
CO2: 26 mEq/L (ref 19–32)
Calcium: 9.4 mg/dL (ref 8.4–10.5)
Creatinine, Ser: 0.8 mg/dL (ref 0.50–1.10)
GFR calc non Af Amer: 70 mL/min — ABNORMAL LOW (ref >90)
GFR, EST AFRICAN AMERICAN: 82 mL/min — AB (ref >90)
Glucose, Bld: 156 mg/dL — ABNORMAL HIGH (ref 70–99)
POTASSIUM: 3.8 meq/L (ref 3.7–5.3)
Sodium: 143 mEq/L (ref 137–147)

## 2013-07-04 MED ORDER — CEFTRIAXONE SODIUM 1 G IJ SOLR
INTRAMUSCULAR | Status: AC
  Filled 2013-07-04: qty 10

## 2013-07-04 MED ORDER — HYDROCODONE-ACETAMINOPHEN 5-325 MG PO TABS
1.0000 | ORAL_TABLET | Freq: Once | ORAL | Status: AC
  Administered 2013-07-04: 1 via ORAL
  Filled 2013-07-04: qty 1

## 2013-07-04 MED ORDER — CEPHALEXIN 500 MG PO CAPS: 500.0000 mg | ORAL_CAPSULE | Freq: Four times a day (QID) | ORAL | Status: DC

## 2013-07-04 MED ORDER — MORPHINE SULFATE 4 MG/ML IJ SOLN
4.0000 mg | Freq: Once | INTRAMUSCULAR | Status: AC
  Administered 2013-07-04: 4 mg via INTRAVENOUS
  Filled 2013-07-04: qty 1

## 2013-07-04 MED ORDER — DEXTROSE 5 % IV SOLN
1.0000 g | Freq: Once | INTRAVENOUS | Status: AC
  Administered 2013-07-04: 1 g via INTRAVENOUS

## 2013-07-04 MED ORDER — HYDROCODONE-ACETAMINOPHEN 5-325 MG PO TABS: 1.0000 | ORAL_TABLET | Freq: Four times a day (QID) | ORAL | Status: DC | PRN

## 2013-07-04 NOTE — Discharge Instructions (Signed)
Take Tylenol for mild pain or the pain medicine prescribed for bad pain. Call Dr. Angus Palms  office in 2 days to schedule appointment for Friday, 07/09/2013. The office staff that [place] spoke with Dr. Caralyn Guile about your case and that you were seen here. Call Dr. Sherren Mocha this week to arrange to be seen in the office within the next one or 2 weeks. Your blood pressure should be rechecked. Today's is elevated at 176/70 . Also asked Dr. Sherren Mocha to check a blood test called hemoglobin A1c to determine if you are diabetic. Today's blood sugar was elevated at 156

## 2013-07-04 NOTE — ED Notes (Signed)
Left wrist and hand pain and swelling. Progressing since Thursday, denies injury

## 2013-07-04 NOTE — ED Provider Notes (Signed)
CSN: [id number]     Arrival date & time 07/04/13  0940 History   First MD Initiated Contact with Patient 07/04/13 1106     Chief Complaint  Patient presents with  . Hand Pain     (Consider location/radiation/quality/duration/timing/severity/associated sxs/prior Treatment) HPI Complains of left hand pain onset 3 days ago pain starts at left volar wrist, raised fingers at approximately one third the way proximally up her volar forearm. Pain is worse with movement improved with range still. No other associated symptoms. She's been treated herself with ibuprofen, without relief. No fever no nausea no vomiting no other associated symptoms Past Medical History  Diagnosis Date  . Hypertension   . OP (osteoporosis)   . GERD (gastroesophageal reflux disease)   . Elevated blood sugar     790.21  . Gout   . Murmur   . Insomnia   . Allergy   . Glaucoma   . PVC (premature ventricular contraction)   . Hypokalemia   . Hypomagnesemia    Past Surgical History  Procedure Laterality Date  . Eye surgery      right and left  . Cervical fusion  05/2010   Family History  Problem Relation Age of Onset  . Pneumonia Mother   . Hypertension Mother   . Heart disease Father    History  Substance Use Topics  . Smoking status: Never Smoker   . Smokeless tobacco: Not on file  . Alcohol Use: No   OB History   Grav Para Term Preterm Abortions TAB SAB Ect Mult Living                 Review of Systems  Musculoskeletal: Positive for myalgias.       Left wrist and hand pain      Allergies  Indomethacin and Promethazine hcl  Home Medications   Prior to Admission medications   Medication Sig Start Date End Date Taking? Authorizing Provider  omeprazole-sodium bicarbonate (ZEGERID) 40-1100 MG per capsule Take 1 capsule by mouth daily before breakfast.   Yes Historical Provider, MD  allopurinol (ZYLOPRIM) 300 MG tablet Take 1 tablet (300 mg total) by mouth daily. 07/14/12   Dorena Cookey, MD   amLODipine (NORVASC) 2.5 MG tablet TAKE 1 TABLET BY MOUTH EVERY DAY    Dorena Cookey, MD  atenolol (TENORMIN) 25 MG tablet Take 1 tablet by mouth  daily    Dorena Cookey, MD  Calcium Carbonate-Vitamin D (CALCIUM + D PO) Take 1 tablet by mouth daily.     Historical Provider, MD  lansoprazole (PREVACID) 30 MG capsule Take 1 capsule by mouth daily. 03/24/13   Historical Provider, MD  latanoprost (XALATAN) 0.005 % ophthalmic solution Place 1 drop into both eyes daily.  03/26/10   Historical Provider, MD  Magnesium 400 MG CAPS Take 400 mg by mouth every other day.    Historical Provider, MD  methotrexate (RHEUMATREX) 2.5 MG tablet Take 2.5 mg by mouth. 6 days a week 03/26/12   Historical Provider, MD  Multiple Vitamins-Iron (MULTIVITAMINS WITH IRON) TABS Take 1 tablet by mouth daily.      Historical Provider, MD  ranitidine (ZANTAC) 300 MG tablet Take 1 tablet (300 mg total) by mouth at bedtime. 07/14/12 07/14/13  Dorena Cookey, MD  traZODone (DESYREL) 50 MG tablet Take 1 tablet by mouth at  bedtime as needed    Dorena Cookey, MD   BP 178/83  Pulse 60  Temp(Src) 98.2 F (36.8 C) (Oral)  Resp 20  Ht 5\' 3"  (1.6 m)  Wt 128 lb (58.06 kg)  BMI 22.68 kg/m2  SpO2 99% Physical Exam  Nursing note and vitals reviewed. Constitutional: She appears well-developed and well-nourished. No distress.  HENT:  Head: Normocephalic and atraumatic.  Eyes: Conjunctivae are normal. Pupils are equal, round, and reactive to light.  Neck: Neck supple. No tracheal deviation present. No thyromegaly present.  Cardiovascular: Normal rate and regular rhythm.   No murmur heard. Pulmonary/Chest: Effort normal and breath sounds normal.  Abdominal: Soft. Bowel sounds are normal. She exhibits no distension. There is no tenderness.  Musculoskeletal: Normal range of motion. She exhibits no edema and no tenderness.  Left upper extremity reddened and tender at the volar wrist and approximately 10 cm proximal to wrist at volar  aspect of forearm. Radial pulse 2+. No axillary nodes. She has pain on extension and flexion of her wrist and on flexion of fingers. All fingers with good capillary refill  Neurological: She is alert. Coordination normal.  Skin: Skin is warm and dry. No rash noted.  Psychiatric: She has a normal mood and affect.    ED Course  Procedures (including critical care time) Labs Review Labs Reviewed - No data to display  Imaging Review Dg Wrist Complete Left  07/04/2013   CLINICAL DATA:  Left wrist pain.  No known injury.  EXAM: LEFT WRIST - COMPLETE 3+ VIEW  COMPARISON:  None  FINDINGS: Moderate degenerative changes at the radiocarpal joint noted.  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  There is no evidence of acute fracture, subluxation or dislocation.  Mild degenerative changes at the first carpometacarpal joint identified.  Diffuse osteopenia is present.  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   Electronically Signed   By: Hassan Rowan M.D.   On: 07/04/2013 11:23     EKG Interpretation None     x-ray rviewed by me Results for orders placed during the hospital encounter of 99/83/38  BASIC METABOLIC PANEL      Result Value Ref Range   Sodium 143  137 - 147 mEq/L   Potassium 3.8  3.7 - 5.3 mEq/L   Chloride 103  96 - 112 mEq/L   CO2 26  19 - 32 mEq/L   Glucose, Bld 156 (*) 70 - 99 mg/dL   BUN 13  6 - 23 mg/dL   Creatinine, Ser 0.80  0.50 - 1.10 mg/dL   Calcium 9.4  8.4 - 10.5 mg/dL   GFR calc non Af Amer 70 (*) >90 mL/min   GFR calc Af Amer 82 (*) >90 mL/min  CBC WITH DIFFERENTIAL      Result Value Ref Range   WBC 4.7  4.0 - 10.5 K/uL   RBC 2.92 (*) 3.87 - 5.11 MIL/uL   Hemoglobin 12.3  12.0 - 15.0 g/dL   HCT 34.4 (*) 36.0 - 46.0 %   MCV 117.8 (*) 78.0 - 100.0 fL   MCH 42.1 (*) 26.0 - 34.0 pg   MCHC 35.8  30.0 - 36.0 g/dL   RDW 13.7  11.5 - 15.5 %   Platelets 174  150 - 400 K/uL   Neutrophils Relative % 67  43 -  77 %   Neutro Abs 3.2  1.7 - 7.7 K/uL   Lymphocytes Relative 23  12 - 46 %   Lymphs Abs 1.1  0.7 - 4.0 K/uL   Monocytes Relative 9  3 - 12 %   Monocytes Absolute 0.4  0.1 - 1.0 K/uL   Eosinophils Relative 2  0 - 5 %   Eosinophils Absolute 0.1  0.0 - 0.7 K/uL   Basophils Relative 0  0 - 1 %   Basophils Absolute 0.0  0.0 - 0.1 K/uL   Smear Review MORPHOLOGY UNREMARKABLE     Dg Wrist Complete Left  07/04/2013   CLINICAL DATA:  Left wrist pain.  No known injury.  EXAM: LEFT WRIST - COMPLETE 3+ VIEW  COMPARISON:  None  FINDINGS: Moderate degenerative changes at the radiocarpal joint noted.  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  There is no evidence of acute fracture, subluxation or dislocation.  Mild degenerative changes at the first carpometacarpal joint identified.  Diffuse osteopenia is present.  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   Electronically Signed   By: Hassan Rowan M.D.   On: 07/04/2013 11:23    12:55 PM patient states pain is improved after treatment with intravenous morphine however requesting more pain medicine. Norco ordered. 1:25 PM patient Feels improved after treatment with Norco and Velcro splint applied to left wrist MDM   Final diagnoses:  None   spoke with Dr. Caralyn Guile I'm concerned about cellulitis or tenosynovitis of left wrist. He suggests wrist splint, prescription Keflex, prescription Norco. He will see her in the office on 07/09/2013. Patient should also contact her private rare care physician Dr. Sherren Mocha to obtain hemoglobin A1c. Blood pressure recheck 1 week. Diagnosis #1 pain left hand and wrist #2 hyperglycemia #3 hypertension       Orlie Dakin, MD 07/04/13 [phone]

## 2013-08-12 ENCOUNTER — Encounter: Payer: Self-pay | Admitting: Family Medicine

## 2013-08-12 ENCOUNTER — Ambulatory Visit (INDEPENDENT_AMBULATORY_CARE_PROVIDER_SITE_OTHER): Payer: Medicare Other | Admitting: Family Medicine

## 2013-08-12 VITALS — BP 138/70 | Temp 98.5°F | Wt 129.0 lb

## 2013-08-12 DIAGNOSIS — I4949 Other premature depolarization: Secondary | ICD-10-CM

## 2013-08-12 DIAGNOSIS — R279 Unspecified lack of coordination: Secondary | ICD-10-CM

## 2013-08-12 DIAGNOSIS — R609 Edema, unspecified: Secondary | ICD-10-CM | POA: Insufficient documentation

## 2013-08-12 DIAGNOSIS — I493 Ventricular premature depolarization: Secondary | ICD-10-CM

## 2013-08-12 DIAGNOSIS — R600 Localized edema: Secondary | ICD-10-CM | POA: Insufficient documentation

## 2013-08-12 DIAGNOSIS — I2510 Atherosclerotic heart disease of native coronary artery without angina pectoris: Secondary | ICD-10-CM

## 2013-08-12 DIAGNOSIS — R270 Ataxia, unspecified: Secondary | ICD-10-CM | POA: Insufficient documentation

## 2013-08-12 MED ORDER — HYDROCHLOROTHIAZIDE 12.5 MG PO TABS: 12.5000 mg | ORAL_TABLET | Freq: Every day | ORAL | Status: DC

## 2013-08-12 NOTE — Patient Instructions (Addendum)
Continue your current medications  Hydrochlorothiazide 12.5 mg...........Jennifer Green 1 daily in the morning  We will get you set up for a neurologic evaluation because of the difficulty with your gait  Check your blood pressure daily in the morning  Return in 3 weeks for followup on your blood pressure...........Jennifer Green bring all your data and the machine

## 2013-08-12 NOTE — Progress Notes (Signed)
   Subjective:    Patient ID: Jennifer Green, female    DOB: Nov 02, 1940, 73 y.o.   MRN: [id number]  HPI Jennifer Green is a 73 year old female who comes in today for evaluation of 3 problems  She says she's concerned about her blood pressure. It seems to fluctuate. She takes Tenormin 25 mg daily along with Norvasc 2.5 mg daily. BP today 130/70.  She's also noticed some swelling of her ankles  She's also difficulty walking. She says when she sits she feels like she is falling forward. This is been going on for about 6 months. She states she went to see a neurologist at one time it [place] because of an abnormal lesion. She was told it was small and benign and not worrisome.  She's had no trouble with her vision hearing strength or sensation   Review of Systems Review of systems otherwise negative    Objective:   Physical Exam Well-developed well-nourished female no acute distress vital signs stable she is afebrile BP 130/70 repeat by me saying  Neurologic exam.... she's oriented x3 strength sensation reflexes all within normal limits her gait is abnormal she can't tandem walk she can heel walk and she tends to fall forward when she leans  Peripheral exam shows 1+ ankle edema       Assessment & Plan:  Hypertension at goal....... continue current therapy  Peripheral edema........ a diuretic.  Abnormal gait.........[name]

## 2013-08-12 NOTE — Progress Notes (Signed)
Pre visit review using our clinic review tool, if applicable. No additional management support is needed unless otherwise documented below in the visit note. 

## 2013-08-26 ENCOUNTER — Encounter: Payer: Self-pay | Admitting: Family Medicine

## 2013-08-26 ENCOUNTER — Ambulatory Visit (INDEPENDENT_AMBULATORY_CARE_PROVIDER_SITE_OTHER): Payer: Medicare Other | Admitting: Family Medicine

## 2013-08-26 VITALS — BP 110/80 | Temp 98.8°F | Wt 130.0 lb

## 2013-08-26 DIAGNOSIS — I2510 Atherosclerotic heart disease of native coronary artery without angina pectoris: Secondary | ICD-10-CM

## 2013-08-26 DIAGNOSIS — I10 Essential (primary) hypertension: Secondary | ICD-10-CM

## 2013-08-26 NOTE — Progress Notes (Signed)
   Subjective:    Patient ID: Jennifer Green, female    DOB: 1940-11-06, 73 y.o.   MRN: [id number]  HPI Jennifer Green is a 73 year old female nonsmoker who comes in today for followup of hypertension  Her blood pressure on Tenormin 25 mg and hydrochlorothiazide 12.5 mg was not at goal. We added Norvasc 2.5 mg. Her blood pressures at home are normal. BP here today 110/80  She is due to see Dr. Tomi Green tomorrow for neurologic evaluation. She's having the episodes of problems with her balance. I've seen her for this a while back we did neurologic exam in the office here and everything was normal. She says her symptoms come and go. We wondered if it was a pressure-related phenomenon however blood pressures normal and her symptoms and not related change in position   Review of Systems Review of systems otherwise negative    Objective:   Physical Exam  Well-developed well-nourished female no acute distress vital signs stable she's afebrile BP 110/80      Assessment & Plan:  Hypertension at goal...[name]. continue current therapy  Trouble with her balance........... neurologic consult tomorrow with Dr. Tomi Green.

## 2013-08-26 NOTE — Patient Instructions (Signed)
Continue your current treatment program followup at the end of August for annual physical as we've previously outlined August 31.

## 2013-08-27 ENCOUNTER — Ambulatory Visit (INDEPENDENT_AMBULATORY_CARE_PROVIDER_SITE_OTHER): Payer: Medicare Other | Admitting: Neurology

## 2013-08-27 ENCOUNTER — Telehealth: Payer: Self-pay | Admitting: Family Medicine

## 2013-08-27 ENCOUNTER — Encounter: Payer: Self-pay | Admitting: Neurology

## 2013-08-27 VITALS — BP 122/80 | HR 66 | Resp 16 | Ht 59.0 in | Wt 130.0 lb

## 2013-08-27 DIAGNOSIS — D329 Benign neoplasm of meninges, unspecified: Secondary | ICD-10-CM

## 2013-08-27 DIAGNOSIS — I2510 Atherosclerotic heart disease of native coronary artery without angina pectoris: Secondary | ICD-10-CM

## 2013-08-27 DIAGNOSIS — R2681 Unsteadiness on feet: Secondary | ICD-10-CM

## 2013-08-27 DIAGNOSIS — R269 Unspecified abnormalities of gait and mobility: Secondary | ICD-10-CM

## 2013-08-27 DIAGNOSIS — D320 Benign neoplasm of cerebral meninges: Secondary | ICD-10-CM

## 2013-08-27 NOTE — Patient Instructions (Signed)
1.  I want to get the last brain MRI results and Dr. Arnoldo Morale notes.  It appears that nothing in the brain would be causing the balance problems.  The meningioma sounds like it is benign and stable. 2.  We will get MRI of cervical spine 3.  If MRI is unremarkable, will order EMG of the left leg to look for evidence of neuropathy.

## 2013-08-27 NOTE — Telephone Encounter (Signed)
Relevant patient education assigned to patient using Emmi. ° °

## 2013-08-27 NOTE — Progress Notes (Signed)
NEUROLOGY CONSULTATION NOTE  Jennifer Green MRN: [id number] DOB: 08/23/1940  Referring provider: Dr. Sherren Mocha Primary care provider: Dr. Sherren Mocha  Reason for consult:  Gait instability  HISTORY OF PRESENT ILLNESS: Jennifer Green is a 73 year old right-handed woman with hypertension and peripheral edema who presents for ataxia.  Available records reviewed.  About 3-4 months ago, she began feeling unsteadiness on her feet. She does aerobics 3-4 times a week. She began noticing feeling unsteadiness while exercising. She notes if she is leaning forward, she would start to topple forward. She's not had any falls however. There is no associated dizziness, vertigo, lightheadedness, or visual disturbance. She denies any weakness in the lower extremities in her legs don't give out. She denies any numbness or difficulty feeling the ground. There is no associated neck pain. There is no associated back pain. There is no unilateral weakness. When she walks, she sometimes feels that she is veering towards the right. Symptoms have been unchanged since onset.  Several years ago, imaging of the brain revealed an incidental meningioma. CT of the brain performed 06/28/04, which was personally reviewed, and revealed a calcified meningioma, approximately 8 mm, in the right anterior parietal region. She reportedly followed up with Dr. Arnoldo Morale of neurosurgery in February. Around that time, she had an MRI of the brain and it was reportedly stable. I don't have the MRI scans or the report.  PAST MEDICAL HISTORY: Past Medical History  Diagnosis Date  . Hypertension   . OP (osteoporosis)   . GERD (gastroesophageal reflux disease)   . Elevated blood sugar     790.21  . Gout   . Murmur   . Insomnia   . Allergy   . Glaucoma   . PVC (premature ventricular contraction)   . Hypokalemia   . Hypomagnesemia     PAST SURGICAL HISTORY: Past Surgical History  Procedure Laterality Date  . Eye surgery      right and left    . Cervical fusion  05/2010    MEDICATIONS: Current Outpatient Prescriptions on File Prior to Visit  Medication Sig Dispense Refill  . allopurinol (ZYLOPRIM) 300 MG tablet Take 1 tablet (300 mg total) by mouth daily.  100 tablet  3  . amLODipine (NORVASC) 2.5 MG tablet TAKE 1 TABLET BY MOUTH EVERY DAY  90 tablet  2  . atenolol (TENORMIN) 25 MG tablet Take 1 tablet by mouth  daily  90 tablet  1  . Calcium Carbonate-Vitamin D (CALCIUM + D PO) Take 1 tablet by mouth daily.       . hydrochlorothiazide (HYDRODIURIL) 12.5 MG tablet Take 1 tablet (12.5 mg total) by mouth daily.  90 tablet  3  . latanoprost (XALATAN) 0.005 % ophthalmic solution Place 1 drop into both eyes daily.       . Magnesium 400 MG CAPS Take 400 mg by mouth every other day.      . methotrexate (RHEUMATREX) 2.5 MG tablet Take 2.5 mg by mouth. 6 days a week      . Multiple Vitamins-Iron (MULTIVITAMINS WITH IRON) TABS Take 1 tablet by mouth daily.        [name] omeprazole-sodium bicarbonate (ZEGERID) 40-1100 MG per capsule Take 1 capsule by mouth daily before breakfast.      . traZODone (DESYREL) 50 MG tablet Take 1 tablet by mouth at  bedtime as needed  90 tablet  1   No current facility-administered medications on file prior to visit.    ALLERGIES: Allergies  Allergen Reactions  . Indomethacin     Rapid Heart Beat  . Promethazine Hcl Other (See Comments)    Couldn't stay still    FAMILY HISTORY: Family History  Problem Relation Age of Onset  . Pneumonia Mother   . Hypertension Mother   . Heart disease Father     SOCIAL HISTORY: History   Social History  . Marital Status: Married    Spouse Name: N/A    Number of Children: 2  . Years of Education: N/A   Occupational History  . retired     Optometrist   Social History Main Topics  . Smoking status: Never Smoker   . Smokeless tobacco: Not on file  . Alcohol Use: No  . Drug Use: No  . Sexual Activity:    Other Topics Concern  . Not on file   Social  History Narrative  . No narrative on file    REVIEW OF SYSTEMS: Constitutional: No fevers, chills, or sweats, no generalized fatigue, change in appetite Eyes: No visual changes, double vision, eye pain Ear, nose and throat: No hearing loss, ear pain, nasal congestion, sore throat Cardiovascular: No chest pain, palpitations Respiratory:  No shortness of breath at rest or with exertion, wheezes GastrointestinaI: No nausea, vomiting, diarrhea, abdominal pain, fecal incontinence Genitourinary:  No dysuria, urinary retention or frequency Musculoskeletal:  No neck pain, back pain Integumentary: No rash, pruritus, skin lesions Neurological: as above Psychiatric: No depression, insomnia, anxiety Endocrine: No palpitations, fatigue, diaphoresis, mood swings, change in appetite, change in weight, increased thirst Hematologic/Lymphatic:  No anemia, purpura, petechiae. Allergic/Immunologic: no itchy/runny eyes, nasal congestion, recent allergic reactions, rashes  PHYSICAL EXAM: Filed Vitals:   08/27/13 0817  BP: 122/80  Pulse: 66  Resp: 16   General: No acute distress Head:  Normocephalic/atraumatic Neck: supple, no paraspinal tenderness, full range of motion Back: No paraspinal tenderness Heart: regular rate and rhythm Lungs: Clear to auscultation bilaterally. Vascular: No carotid bruits. Neurological Exam: Mental status: alert and oriented to person, place, and time, recent and remote memory intact, fund of knowledge intact, attention and concentration intact, speech fluent and not dysarthric, language intact. Cranial nerves: CN I: not tested CN II: pupils equal, round and reactive to light, visual fields intact, fundi unremarkable, without vessel changes, exudates, hemorrhages or papilledema. CN III, IV, VI:  full range of motion, no nystagmus, no ptosis CN V: facial sensation intact CN VII: upper and lower face symmetric CN VIII: hearing intact CN IX, X: gag intact, uvula  midline CN XI: sternocleidomastoid and trapezius muscles intact CN XII: tongue midline Bulk & Tone: normal, no fasciculations. Motor: 5 out of 5 throughout. Sensation: Endorses reduced pinprick sensation over the dorsum, lateral aspect, and bottom of the left foot and shin. Reduced vibration sensation in the left big toe. Deep Tendon Reflexes: 2+ throughout, toes downgoing. Finger to nose testing: No dysmetria Heel to shin: No dysmetria Gait: Stride is overall good, with mild veering to either side. She is unable to tandem walk Romberg with mild sway. When standing and bending forward, she remained stable posture.  IMPRESSION: 1.  Gait instability.  MRI of the brain reportedly unremarkable. She does not have clear upper motor neuron signs, but cervical stenosis is a possibility. She doesn't clearly have evidence of a polyneuropathy, although sensory testing reveals possible left L5-S1 radiculopathy. She doesn't clearly have clinical signs of lumbar stenosis. 2.  Meningioma.  It appears to be stable based on the patient.  Incidental finding and probably  not related to gait instability.  PLAN: 1. I would like to obtain the report of the previous MRI of the brain from this year. 2. We will get an MRI of the cervical spine without contrast to look for any evidence of cervical stenosis. 3. If cervical MRI unremarkable, would proceed with NCV-EMG of the left lower extremity to assess for either peripheral neuropathy or at least confirm lumbar radiculopathy. Imaging of the lumbar spine may be considered at that time. 4. Will followup after the necessary tests are performed.  45 minutes that with the patient, over 50% spent discussing possible etiologies and coordinating care. Thank you for allowing me to take part in the care of this patient.  Metta Clines, DO  CC:  Stevie Kern, MD

## 2013-09-08 ENCOUNTER — Ambulatory Visit (HOSPITAL_COMMUNITY): Payer: Medicare Other

## 2013-09-14 ENCOUNTER — Ambulatory Visit (HOSPITAL_COMMUNITY)
Admission: RE | Admit: 2013-09-14 | Discharge: 2013-09-14 | Disposition: A | Discharge status: Home / Self Care | Payer: Medicare Other | Source: Ambulatory Visit | Attending: Neurology | Admitting: Neurology

## 2013-09-14 DIAGNOSIS — R269 Unspecified abnormalities of gait and mobility: Secondary | ICD-10-CM | POA: Insufficient documentation

## 2013-09-14 DIAGNOSIS — M4802 Spinal stenosis, cervical region: Secondary | ICD-10-CM | POA: Diagnosis not present

## 2013-09-14 DIAGNOSIS — Z981 Arthrodesis status: Secondary | ICD-10-CM | POA: Diagnosis not present

## 2013-09-14 DIAGNOSIS — M5020 Other cervical disc displacement, unspecified cervical region: Secondary | ICD-10-CM | POA: Insufficient documentation

## 2013-09-14 DIAGNOSIS — M5030 Other cervical disc degeneration, unspecified cervical region: Secondary | ICD-10-CM | POA: Insufficient documentation

## 2013-09-23 ENCOUNTER — Other Ambulatory Visit: Payer: Self-pay | Admitting: Family Medicine

## 2013-09-23 ENCOUNTER — Ambulatory Visit: Payer: Medicare Other | Admitting: Family Medicine

## 2013-09-27 ENCOUNTER — Other Ambulatory Visit: Payer: Self-pay | Admitting: *Deleted

## 2013-09-27 DIAGNOSIS — M5416 Radiculopathy, lumbar region: Secondary | ICD-10-CM

## 2013-10-11 ENCOUNTER — Ambulatory Visit (INDEPENDENT_AMBULATORY_CARE_PROVIDER_SITE_OTHER): Payer: Medicare Other | Admitting: Family Medicine

## 2013-10-11 ENCOUNTER — Encounter: Payer: Self-pay | Admitting: Family Medicine

## 2013-10-11 VITALS — BP 154/90 | HR 58 | Temp 98.7°F | Ht 60.5 in | Wt 128.0 lb

## 2013-10-11 DIAGNOSIS — R609 Edema, unspecified: Secondary | ICD-10-CM

## 2013-10-11 DIAGNOSIS — I4949 Other premature depolarization: Secondary | ICD-10-CM

## 2013-10-11 DIAGNOSIS — I2510 Atherosclerotic heart disease of native coronary artery without angina pectoris: Secondary | ICD-10-CM

## 2013-10-11 DIAGNOSIS — G4700 Insomnia, unspecified: Secondary | ICD-10-CM

## 2013-10-11 DIAGNOSIS — I10 Essential (primary) hypertension: Secondary | ICD-10-CM

## 2013-10-11 DIAGNOSIS — I493 Ventricular premature depolarization: Secondary | ICD-10-CM

## 2013-10-11 DIAGNOSIS — M109 Gout, unspecified: Secondary | ICD-10-CM

## 2013-10-11 DIAGNOSIS — E785 Hyperlipidemia, unspecified: Secondary | ICD-10-CM

## 2013-10-11 LAB — POCT URINALYSIS DIPSTICK
Bilirubin, UA: NEGATIVE
Glucose, UA: NEGATIVE
Ketones, UA: NEGATIVE
Nitrite, UA: NEGATIVE
PH UA: 7.5
Protein, UA: NEGATIVE
RBC UA: NEGATIVE
SPEC GRAV UA: 1.015
UROBILINOGEN UA: 0.2

## 2013-10-11 LAB — TSH: TSH: 2.34 u[IU]/mL (ref 0.35–4.50)

## 2013-10-11 LAB — HEPATIC FUNCTION PANEL
ALK PHOS: 54 U/L (ref 39–117)
ALT: 41 U/L — AB (ref 0–35)
AST: 32 U/L (ref 0–37)
Albumin: 3.6 g/dL (ref 3.5–5.2)
BILIRUBIN DIRECT: 0.3 mg/dL (ref 0.0–0.3)
TOTAL PROTEIN: 6.2 g/dL (ref 6.0–8.3)
Total Bilirubin: 1.3 mg/dL — ABNORMAL HIGH (ref 0.2–1.2)

## 2013-10-11 MED ORDER — TRAZODONE HCL 50 MG PO TABS: ORAL_TABLET | ORAL | Status: DC

## 2013-10-11 MED ORDER — AMLODIPINE BESYLATE 2.5 MG PO TABS: ORAL_TABLET | ORAL | Status: DC

## 2013-10-11 MED ORDER — ALLOPURINOL 300 MG PO TABS: ORAL_TABLET | ORAL | Status: DC

## 2013-10-11 MED ORDER — HYDROCHLOROTHIAZIDE 12.5 MG PO TABS: 12.5000 mg | ORAL_TABLET | Freq: Every day | ORAL | Status: DC

## 2013-10-11 MED ORDER — ATENOLOL 25 MG PO TABS: ORAL_TABLET | ORAL | Status: DC

## 2013-10-11 NOTE — Patient Instructions (Signed)
Continue your current medications  Labs today.......... walk 30 minutes daily  Return in one year sooner if any problems  Pneumovax 13 and a flu shot when you get back from vacation......... call and leave a voicemail with Apolonio Schneiders

## 2013-10-11 NOTE — Progress Notes (Signed)
   Subjective:    Patient ID: Jennifer Green, female    DOB: 10/02/1940, 73 y.o.   MRN: [id number]  HPI Jennifer Green is a 73 year old female who comes in today for evaluation of gout, hypertension, and insomnia  Med list reviewed there've been no changes. She's on methotrexate by Dr. Ouida Sills for rheumatoid arthritis. She's on omeprazole with bicarbonate from Dr. Wynetta Emery her gastroenterologist.  She gets routine eye care, dental care, BSE monthly, and you mammography, colonoscopy 10 years normal Dr. Wynetta Emery advise she does not need any further colonoscopies  Her blood pressure here is 154/90 at home it runs 4:82 to 707E systolic diastolic 67-54.  Cognitive function normal she walks daily home health safety reviewed no issues identified, no guns in the house, she does have a health care power of attorney and living well  She declines a flu shot and Pneumovax 13 now because she's going to [place] on day patient   Review of Systems  Constitutional: Negative.   HENT: Negative.   Eyes: Negative.   Respiratory: Negative.   Cardiovascular: Negative.   Gastrointestinal: Negative.   Genitourinary: Negative.   Musculoskeletal: Negative.   Neurological: Negative.   Psychiatric/Behavioral: Negative.        Objective:   Physical Exam  Nursing note and vitals reviewed. Constitutional: She appears well-developed and well-nourished.  HENT:  Head: Normocephalic and atraumatic.  Right Ear: External ear normal.  Left Ear: External ear normal.  Nose: Nose normal.  Mouth/Throat: Oropharynx is clear and moist.  Eyes: EOM are normal. Pupils are equal, round, and reactive to light.  Neck: Normal range of motion. Neck supple. No thyromegaly present.  Cardiovascular: Normal rate, regular rhythm, normal heart sounds and intact distal pulses.  Exam reveals no gallop and no friction rub.   No murmur heard. Pulmonary/Chest: Effort normal and breath sounds normal.  Abdominal: Soft. Bowel sounds are  normal. She exhibits no distension and no mass. There is no tenderness. There is no rebound.  Genitourinary:  Bilateral breast exam normal......... left nipple chronically inverted.  Musculoskeletal: Normal range of motion.  Lymphadenopathy:    She has no cervical adenopathy.  Neurological: She is alert. She has normal reflexes. No cranial nerve deficit. She exhibits normal muscle tone. Coordination normal.  Skin: Skin is warm and dry.  Psychiatric: She has a normal mood and affect. Her behavior is normal. Judgment and thought content normal.          Assessment & Plan:  Healthy female  History of gout continue allopurinol  Hypertension continue Norvasc hydrochlorothiazide  Insomnia continue trazodone 50 mg at bedtime  Rheumatologist followup for rheumatoid arthritis

## 2013-10-11 NOTE — Progress Notes (Signed)
Pre visit review using our clinic review tool, if applicable. No additional management support is needed unless otherwise documented below in the visit note. 

## 2013-11-11 ENCOUNTER — Ambulatory Visit (INDEPENDENT_AMBULATORY_CARE_PROVIDER_SITE_OTHER): Payer: Medicare Other | Admitting: Neurology

## 2013-11-11 DIAGNOSIS — M5416 Radiculopathy, lumbar region: Secondary | ICD-10-CM

## 2013-11-11 NOTE — Procedures (Signed)
[facility]  [place], [address]  [place], [place] [address] Tel: [phone] Fax:  [phone] Test Date:  11/11/2013  Patient: Jennifer Green DOB: [birth year] Physician: Narda Amber  Sex: Female Height: 4\' 11"  Ref Phys: Metta Clines  ID#: [id number]   Technician: Laureen Ochs R. NCS T.   Patient Complaints: Patient is a 73 year old female here for evaluation of progressive gait instability.   NCV & EMG Findings: Extensive electrodiagnostic testing of the left lower extremity and additional studies of the right reveals:  1. The left superficial peroneal sensory response is barely present. Bilateral sural and the right superficial peroneal sensory responses are within normal limits. 2. The peroneal motor response recording at the extensor digitorum brevis is absent, however normal when recording at the tibialis anterior bilaterally. Bilateral tibial motor responses are reduced in amplitude. 3. Chronic motor axon loss changes are seen affecting bilateral L2-S1 myotomes, without accompanied active changes.  Impression: 1. 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 2. Low superficial peroneal sensory response is most likely due to a proximally lying L5 dorsal root ganglion on the left side. An early generalized sensorimotor polyneuropathy is thought to be less likely.  ___________________________ Narda Amber    Nerve Conduction Studies Anti Sensory Summary Table   Stim Site NR Peak (ms) Norm Peak (ms) P-T Amp (V) Norm P-T Amp  Left Sup Peroneal Anti Sensory (Ant Lat Mall)  33C  12 cm    2.8 <4.6 0.4 >3  Right Sup Peroneal Anti Sensory (Ant Lat Mall)  33C  12 cm    3.0 <4.6 3.2 >3  Left Sural Anti Sensory (Lat Mall)  33C    at 12cm  Calf    4.1 <4.6 4.9 >3  Right Sural Anti Sensory (Lat Mall)   33C  Calf    3.0 <4.6 7.2 >3   Motor Summary Table   Stim Site NR Onset (ms) Norm Onset (ms) O-P Amp (mV) Norm O-P Amp Site1 Site2 Delta-0 (ms) Dist (cm) Vel (m/s) Norm Vel (m/s)  Left Peroneal Motor (Ext Dig Brev)  33C  Ankle NR  <6.0  >2.5 B Fib Ankle  0.0  >40  B Fib    0.9  0.9         Right Peroneal Motor (Ext Dig Brev)  33C  Ankle NR  <6.0  >2.5 B Fib Ankle  0.0  >40  B Fib NR     Poplt B Fib  0.0  >40  Poplt NR            Left Peroneal TA Motor (Tib Ant)  33C  Fib Head    2.1 <4.5 4.2 >3 Poplit Fib Head 2.0 10.0 50 >40  Poplit    4.1  3.9         Right Peroneal TA Motor (Tib Ant)  33C  Fib Head    2.6 <4.5 4.8 >3 Poplit Fib Head 1.8 10.0 56 >40  Poplit    4.4  4.8         Left Tibial Motor (Abd Hall Brev)  33C  Ankle    4.9 <6.0 3.0 >4 Knee Ankle 8.6 34.0 40 >40  Knee    13.5  1.5         Right Tibial Motor (Abd Hall Brev)  33C  Ankle    4.4 <6.0 2.0 >4 Knee  Ankle 8.6 33.0 38 >40  Knee    13.0  1.3          H Reflex Studies   NR H-Lat (ms) Lat Norm (ms) L-R H-Lat (ms)  Left Tibial (Gastroc)  33C     33.74 <35 1.63  Right Tibial (Gastroc)  33C     32.11 <35 1.63   EMG   Side Muscle Ins Act Fibs Psw Fasc Number Recrt Dur Dur. Amp Amp. Poly Poly. Comment  Left AntTibialis Nml Nml Nml Nml 1- Mod-R Some 1+ Some 1+ Some 1+ N/A  Left Gastroc Nml Nml Nml Nml 1- Mod-R Few 1+ Few 1+ Few 1+ N/A  Left Flex Dig Long Nml Nml Nml Nml 1- Rapid Some 1+ Nml Nml Nml Nml N/A  Left RectFemoris Nml Nml Nml Nml 1- Rapid Some 1+ Some 1+ Nml Nml N/A  Left AdductorLong Nml Nml Nml Nml 1- Mod-R Some 1+ Some 1+ Nml Nml N/A  Left GluteusMed Nml Nml Nml Nml 1- Mod-R Some 1+ Nml Nml Nml Nml N/A  Left BicepsFemS Nml Nml Nml Nml 1- Mod-R Some 1+ Nml Nml Nml Nml N/A  Right AntTibialis Nml Nml Nml Nml 1- Mod-R Some 1+ Nml Nml Nml Nml N/A  Right Gastroc Nml Nml Nml Nml 1- Mod-R Some 1+ Nml Nml Nml Nml N/A  Right RectFemoris Nml Nml Nml Nml 1- Rapid Some 1+ Nml Nml Nml Nml N/A       Waveforms:

## 2013-11-15 ENCOUNTER — Telehealth: Payer: Self-pay | Admitting: *Deleted

## 2013-11-15 NOTE — Telephone Encounter (Signed)
Left message for patient to return call.

## 2013-11-15 NOTE — Telephone Encounter (Signed)
Message copied by Claudie Revering on Mon Nov 15, 2013 10:15 AM ------      Message from: JAFFE, ADAM R      Created: Mon Nov 15, 2013  6:43 AM       The EMG reveals evidence of possible pinched nerves in the lumbar spine (multi-level lumbosacral radiculopathy).  Therefore, I would like to get MRI of the lumbar spine without contrast and follow up with me soon after to discuss results and next step.      ----- Message -----         From: Alda Berthold, DO         Sent: 11/11/2013   5:38 PM           To: Dudley Major, DO                   ------

## 2013-11-16 ENCOUNTER — Other Ambulatory Visit: Payer: Self-pay | Admitting: *Deleted

## 2013-11-16 ENCOUNTER — Telehealth: Payer: Self-pay | Admitting: *Deleted

## 2013-11-16 DIAGNOSIS — M5417 Radiculopathy, lumbosacral region: Secondary | ICD-10-CM

## 2013-11-16 NOTE — Telephone Encounter (Signed)
Message copied by Claudie Revering on Tue Nov 16, 2013  9:27 AM ------      Message from: JAFFE, ADAM R      Created: Mon Nov 15, 2013  6:43 AM       The EMG reveals evidence of possible pinched nerves in the lumbar spine (multi-level lumbosacral radiculopathy).  Therefore, I would like to get MRI of the lumbar spine without contrast and follow up with me soon after to discuss results and next step.      ----- Message -----         From: Alda Berthold, DO         Sent: 11/11/2013   5:38 PM           To: Dudley Major, DO                   ------

## 2013-11-16 NOTE — Telephone Encounter (Signed)
Completed - patient is aware of results MRI at [facility] on 11/24/13 4:45pm and follow up in our office 12/06/13 8:30 with a 8:15 arrival

## 2013-11-22 ENCOUNTER — Other Ambulatory Visit: Payer: Self-pay | Admitting: Rheumatology

## 2013-11-22 DIAGNOSIS — M25571 Pain in right ankle and joints of right foot: Secondary | ICD-10-CM

## 2013-11-23 ENCOUNTER — Ambulatory Visit
Admission: RE | Admit: 2013-11-23 | Discharge: 2013-11-23 | Disposition: A | Discharge status: Home / Self Care | Payer: Medicare Other | Source: Ambulatory Visit | Attending: Rheumatology | Admitting: Rheumatology

## 2013-11-23 DIAGNOSIS — M25571 Pain in right ankle and joints of right foot: Secondary | ICD-10-CM

## 2013-11-24 ENCOUNTER — Ambulatory Visit (HOSPITAL_COMMUNITY): Admission: RE | Admit: 2013-11-24 | Payer: Medicare Other | Source: Ambulatory Visit

## 2013-11-25 ENCOUNTER — Inpatient Hospital Stay: Admission: RE | Admit: 2013-11-25 | Payer: Medicare Other | Source: Ambulatory Visit

## 2013-11-25 ENCOUNTER — Other Ambulatory Visit: Payer: Medicare Other

## 2013-12-01 ENCOUNTER — Ambulatory Visit (HOSPITAL_COMMUNITY)
Admission: RE | Admit: 2013-12-01 | Discharge: 2013-12-01 | Disposition: A | Discharge status: Home / Self Care | Payer: Medicare Other | Source: Ambulatory Visit | Attending: Neurology | Admitting: Neurology

## 2013-12-01 DIAGNOSIS — R2689 Other abnormalities of gait and mobility: Secondary | ICD-10-CM | POA: Diagnosis present

## 2013-12-01 DIAGNOSIS — M4806 Spinal stenosis, lumbar region: Secondary | ICD-10-CM | POA: Insufficient documentation

## 2013-12-01 DIAGNOSIS — G589 Mononeuropathy, unspecified: Secondary | ICD-10-CM | POA: Diagnosis present

## 2013-12-01 DIAGNOSIS — M5417 Radiculopathy, lumbosacral region: Secondary | ICD-10-CM | POA: Diagnosis present

## 2013-12-01 DIAGNOSIS — M5126 Other intervertebral disc displacement, lumbar region: Secondary | ICD-10-CM | POA: Diagnosis not present

## 2013-12-06 ENCOUNTER — Encounter: Payer: Self-pay | Admitting: Neurology

## 2013-12-06 ENCOUNTER — Ambulatory Visit (INDEPENDENT_AMBULATORY_CARE_PROVIDER_SITE_OTHER): Payer: Medicare Other | Admitting: Neurology

## 2013-12-06 VITALS — BP 160/82 | HR 78 | Resp 20 | Ht 60.0 in | Wt 137.1 lb

## 2013-12-06 DIAGNOSIS — I2510 Atherosclerotic heart disease of native coronary artery without angina pectoris: Secondary | ICD-10-CM

## 2013-12-06 DIAGNOSIS — R2681 Unsteadiness on feet: Secondary | ICD-10-CM

## 2013-12-06 DIAGNOSIS — M5416 Radiculopathy, lumbar region: Secondary | ICD-10-CM

## 2013-12-06 NOTE — Patient Instructions (Signed)
I don't have a specific cause for your balance problems.  At this point, it would be conservative management, such as balance exercises.  Follow up as needed.

## 2013-12-06 NOTE — Progress Notes (Signed)
NEUROLOGY FOLLOW UP OFFICE NOTE  IRMGARD RAMPERSAUD [id number]  HISTORY OF PRESENT ILLNESS: Jennifer Green is a 73 year old right-handed woman with hypertension and peripheral edema who follows up for gait instability.  UPDATE: An MRI of the cervical spine without contrast was performed on 09/15/13, which revealed interval C5-6 ACDF without residual stenosis.  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  She currently is wearing a boot on her right foot because she fractured it.  She reports 6 weeks ago having an episode of syncope while at church.  Orthostatic hypotension was suspected.  HISTORY: About 6-7 months ago, she began feeling unsteadiness on her feet. She does aerobics 3-4 times a week. She began noticing feeling unsteadiness while exercising. She notes if she is leaning forward, she would start to topple forward. She's not had any falls however. There is no associated dizziness, vertigo, lightheadedness, or visual disturbance. She denies any weakness in the lower extremities in her legs don't give out. She denies any numbness or difficulty feeling the ground. There is no associated neck pain. There is no associated back pain. There is no unilateral weakness. When she walks, she sometimes feels that she is veering towards the right. Symptoms have been unchanged since onset.  Several years ago, imaging of the brain revealed an incidental meningioma. CT of the brain performed 06/28/04, which was personally reviewed, and revealed a calcified meningioma, approximately 8 mm, in the right anterior parietal region. She reportedly followed up with Dr. Arnoldo Morale of neurosurgery in February. Around that time, she had an MRI of the brain and it was  reportedly stable.    PAST MEDICAL HISTORY: Past Medical History  Diagnosis Date  . Hypertension   . OP (osteoporosis)   . GERD (gastroesophageal reflux disease)   . Elevated blood sugar     790.21  . Gout   . Murmur   . Insomnia   . Allergy   . Glaucoma   . PVC (premature ventricular contraction)   . Hypokalemia   . Hypomagnesemia     MEDICATIONS: Current Outpatient Prescriptions on File Prior to Visit  Medication Sig Dispense Refill  . allopurinol (ZYLOPRIM) 300 MG tablet Take 1 tablet by mouth  daily  90 tablet  3  . amLODipine (NORVASC) 2.5 MG tablet TAKE 1 TABLET BY MOUTH EVERY DAY  90 tablet  3  . atenolol (TENORMIN) 25 MG tablet Take 1 tablet by mouth  daily  90 tablet  3  . Calcium Carbonate-Vitamin D (CALCIUM + D PO) Take 1 tablet by mouth daily.       . hydrochlorothiazide (HYDRODIURIL) 12.5 MG tablet Take 1 tablet (12.5 mg total) by mouth daily.  90 tablet  3  . latanoprost (XALATAN) 0.005 % ophthalmic solution Place 1 drop into both eyes daily.       . Magnesium 400 MG CAPS Take 400 mg by mouth every other day.      . methotrexate (RHEUMATREX) 2.5 MG tablet Take 2.5 mg by mouth. 6 days a week      . Multiple Vitamins-Iron (MULTIVITAMINS WITH IRON) TABS Take 1 tablet by mouth daily.        [name] omeprazole-sodium bicarbonate (ZEGERID) 40-1100 MG per capsule Take 1 capsule by mouth daily before breakfast.      .  traZODone (DESYREL) 50 MG tablet Take 1 tablet by mouth at  bedtime as needed  90 tablet  3   No current facility-administered medications on file prior to visit.    ALLERGIES: Allergies  Allergen Reactions  . Indomethacin     Rapid Heart Beat  . Promethazine Hcl Other (See Comments)    Couldn't stay still    FAMILY HISTORY: Family History  Problem Relation Age of Onset  . Pneumonia Mother   . Hypertension Mother   . Heart disease Father     SOCIAL HISTORY: History   Social History  . Marital Status: Married    Spouse Name: N/A    Number of  Children: 2  . Years of Education: N/A   Occupational History  . retired     Optometrist   Social History Main Topics  . Smoking status: Never Smoker   . Smokeless tobacco: Not on file  . Alcohol Use: No  . Drug Use: No  . Sexual Activity: No   Other Topics Concern  . Not on file   Social History Narrative  . No narrative on file    REVIEW OF SYSTEMS: Constitutional: No fevers, chills, or sweats, no generalized fatigue, change in appetite Eyes: No visual changes, double vision, eye pain Ear, nose and throat: No hearing loss, ear pain, nasal congestion, sore throat Cardiovascular: No chest pain, palpitations Respiratory:  No shortness of breath at rest or with exertion, wheezes GastrointestinaI: No nausea, vomiting, diarrhea, abdominal pain, fecal incontinence Genitourinary:  No dysuria, urinary retention or frequency Musculoskeletal:  Right foot fracture Integumentary: No rash, pruritus, skin lesions Neurological: as above Psychiatric: No depression, insomnia, anxiety Endocrine: No palpitations, fatigue, diaphoresis, mood swings, change in appetite, change in weight, increased thirst Hematologic/Lymphatic:  No anemia, purpura, petechiae. Allergic/Immunologic: no itchy/runny eyes, nasal congestion, recent allergic reactions, rashes  PHYSICAL EXAM: Filed Vitals:   12/06/13 0826  BP: 160/82  Pulse: 78  Resp: 20   General: No acute distress Head:  Normocephalic/atraumatic Neck: supple, no paraspinal tenderness, full range of motion Heart:  Regular rate and rhythm Lungs:  Clear to auscultation bilaterally Back: No paraspinal tenderness Neurological Exam: alert and oriented to person, place, and time. Attention span and concentration intact, recent and remote memory intact, fund of knowledge intact.  Speech fluent and not dysarthric, language intact.  CN II-XII intact. Fundi not visualized.  Bulk and tone normal, muscle strength 5/5 throughout.  Sensation to light touch  intact.  Deep tendon reflexes 1+ throughout, toes downgoing.  Finger to nose  testing intact.  Gait with limp due to right boot.  IMPRESSION: Gait instability.  Unknown etiology.   Lumbar polyradiculopathy.  Foraminal stenosis, so not likely to be the primary cause of the gait instability  PLAN: I would just recommend conservative management and caution while ambulating.  Follow up as needed.  Metta Clines, DO  CC:   Stevie Kern, MD

## 2013-12-09 NOTE — Telephone Encounter (Signed)
Error

## 2013-12-16 ENCOUNTER — Other Ambulatory Visit: Payer: Self-pay | Admitting: Family Medicine

## 2014-02-25 ENCOUNTER — Other Ambulatory Visit: Payer: Medicare Other

## 2014-02-25 ENCOUNTER — Ambulatory Visit (INDEPENDENT_AMBULATORY_CARE_PROVIDER_SITE_OTHER): Payer: Medicare Other | Admitting: Internal Medicine

## 2014-02-25 ENCOUNTER — Encounter: Payer: Self-pay | Admitting: Internal Medicine

## 2014-02-25 VITALS — BP 146/70 | HR 71 | Ht 60.0 in | Wt 134.8 lb

## 2014-02-25 DIAGNOSIS — R0689 Other abnormalities of breathing: Secondary | ICD-10-CM

## 2014-02-25 DIAGNOSIS — R0600 Dyspnea, unspecified: Secondary | ICD-10-CM

## 2014-02-25 LAB — D-DIMER, QUANTITATIVE: D-Dimer, Quant: 0.69 ug/mL-FEU — ABNORMAL HIGH (ref 0.00–0.48)

## 2014-02-25 NOTE — Patient Instructions (Addendum)
#  Shortness of breath  - concern shortness of breath might be due to blood clot in lung or body physical deconditioning  - do D-dimer blood test  - if test , positive will do further workup to confirm/rule out blood clot   - if test negative, then will do PFT breathing test   #Followup Based on d-dimer blood test

## 2014-02-25 NOTE — Progress Notes (Signed)
Subjective:    Patient ID: Jennifer Green, female    DOB: 23-Mar-1940, 74 y.o.   MRN: [id number]  HPI   OV 02/25/2014 Chief Complaint  Patient presents with  . Follow-up    Pt stated she recently fractured her right ankle. Pt stated her muscle strength has decreased and has limited mobility d/t healing ankle. Pt c/o increaes in SOB, mild dry cough. Pt denies CP/tightness.    74 year old female who has chronic cough due to irritable larynx and sinus and acid reflux issues. She normally sees me only once a year. She comes in now because he is having new onset dyspnea on exertion. She reports that in September 2015 she fractured her right ankle. She did not know what her fracture for a long time and was subsequently treated with a cast. She says the cast is only been removed a week ago. Currently she has residual neural muscular weakness in the right lower extremity because of deconditioning. She started noticing dyspnea of insidious onset in the last 6 weeks or so. It is progressive and is of moderate in severity. She calls the nature of progression as "aggressive" that is bilateral associated pedal edema with the dyspnea is no chest pain or cough or fever or chills or nausea or vomiting or orthopnea or paroxysmal nocturnal dyspnea or hemoptysis. Cough is not much of an issue she feels well otherwise    has a past medical history of Hypertension; OP (osteoporosis); GERD (gastroesophageal reflux disease); Elevated blood sugar; Gout; Murmur; Insomnia; Allergy; Glaucoma; PVC (premature ventricular contraction); Hypokalemia; and Hypomagnesemia.   reports that she has never smoked. She does not have any smokeless tobacco history on file.  Past Surgical History  Procedure Laterality Date  . Eye surgery      right and left  . Cervical fusion  05/2010    Allergies  Allergen Reactions  . Indomethacin     Rapid Heart Beat  . Promethazine Hcl Other (See Comments)    Couldn't stay still     Immunization History  Administered Date(s) Administered  . Influenza Split 12/05/2010, 11/11/2012  . Influenza Whole 02/11/2005, 02/16/2009  . Influenza, High Dose Seasonal PF 12/29/2013  . Pneumococcal Polysaccharide-23 02/11/2002, 02/16/2009  . Td 02/11/2002  . Zoster 09/09/2012    Family History  Problem Relation Age of Onset  . Pneumonia Mother   . Hypertension Mother   . Heart disease Father      Current outpatient prescriptions:  .  allopurinol (ZYLOPRIM) 300 MG tablet, Take 1 tablet by mouth  daily, Disp: 90 tablet, Rfl: 3 .  amLODipine (NORVASC) 2.5 MG tablet, TAKE 1 TABLET BY MOUTH EVERY DAY, Disp: 90 tablet, Rfl: 3 .  atenolol (TENORMIN) 25 MG tablet, Take 1 tablet by mouth  daily, Disp: 90 tablet, Rfl: 3 .  Calcium Carbonate-Vitamin D (CALCIUM + D PO), Take 1 tablet by mouth daily. , Disp: , Rfl:  .  latanoprost (XALATAN) 0.005 % ophthalmic solution, Place 1 drop into both eyes daily. , Disp: , Rfl:  .  Magnesium 400 MG CAPS, Take 400 mg by mouth every other day., Disp: , Rfl:  .  methotrexate (RHEUMATREX) 2.5 MG tablet, Take 2.5 mg by mouth every other day. , Disp: , Rfl:  .  Multiple Vitamins-Iron (MULTIVITAMINS WITH IRON) TABS, Take 1 tablet by mouth daily.  , Disp: , Rfl:  .  omeprazole-sodium bicarbonate (ZEGERID) 40-1100 MG per capsule, Take 1 capsule by mouth daily before breakfast., Disp: , Rfl:  .  ranitidine (ZANTAC) 300 MG tablet, Take 1 tablet by mouth at  bedtime, Disp: 90 tablet, Rfl: 3 .  traZODone (DESYREL) 50 MG tablet, Take 1 tablet by mouth at  bedtime as needed, Disp: 90 tablet, Rfl: 3    Review of Systems  Constitutional: Positive for fatigue. Negative for fever and unexpected weight change.  HENT: Negative for congestion, dental problem, ear pain, nosebleeds, postnasal drip, rhinorrhea, sinus pressure, sneezing, sore throat and trouble swallowing.   Eyes: Negative for redness and itching.  Respiratory: Positive for cough and shortness of  breath. Negative for chest tightness and wheezing.   Cardiovascular: Negative for palpitations and leg swelling.  Gastrointestinal: Negative for nausea and vomiting.  Genitourinary: Negative for dysuria.  Musculoskeletal: Negative for joint swelling.  Skin: Negative for rash.  Neurological: Negative for headaches.  Hematological: Does not bruise/bleed easily.  Psychiatric/Behavioral: Negative for dysphoric mood. The patient is not nervous/anxious.     Current outpatient prescriptions:  .  allopurinol (ZYLOPRIM) 300 MG tablet, Take 1 tablet by mouth  daily, Disp: 90 tablet, Rfl: 3 .  amLODipine (NORVASC) 2.5 MG tablet, TAKE 1 TABLET BY MOUTH EVERY DAY, Disp: 90 tablet, Rfl: 3 .  atenolol (TENORMIN) 25 MG tablet, Take 1 tablet by mouth  daily, Disp: 90 tablet, Rfl: 3 .  Calcium Carbonate-Vitamin D (CALCIUM + D PO), Take 1 tablet by mouth daily. , Disp: , Rfl:  .  latanoprost (XALATAN) 0.005 % ophthalmic solution, Place 1 drop into both eyes daily. , Disp: , Rfl:  .  Magnesium 400 MG CAPS, Take 400 mg by mouth every other day., Disp: , Rfl:  .  methotrexate (RHEUMATREX) 2.5 MG tablet, Take 2.5 mg by mouth every other day. , Disp: , Rfl:  .  Multiple Vitamins-Iron (MULTIVITAMINS WITH IRON) TABS, Take 1 tablet by mouth daily.  , Disp: , Rfl:  .  omeprazole-sodium bicarbonate (ZEGERID) 40-1100 MG per capsule, Take 1 capsule by mouth daily before breakfast., Disp: , Rfl:  .  ranitidine (ZANTAC) 300 MG tablet, Take 1 tablet by mouth at  bedtime, Disp: 90 tablet, Rfl: 3 .  traZODone (DESYREL) 50 MG tablet, Take 1 tablet by mouth at  bedtime as needed, Disp: 90 tablet, Rfl: 3     Objective:   Physical Exam  Constitutional: She is oriented to person, place, and time. She appears well-developed and well-nourished. No distress.  HENT:  Head: Normocephalic and atraumatic.  Right Ear: External ear normal.  Left Ear: External ear normal.  Mouth/Throat: Oropharynx is clear and moist. No oropharyngeal  exudate.  Eyes: Conjunctivae and EOM are normal. Pupils are equal, round, and reactive to light. Right eye exhibits no discharge. Left eye exhibits no discharge. No scleral icterus.  Neck: Normal range of motion. Neck supple. No JVD present. No tracheal deviation present. No thyromegaly present.  Cardiovascular: Normal rate, regular rhythm, normal heart sounds and intact distal pulses.  Exam reveals no gallop and no friction rub.   No murmur heard. Pulmonary/Chest: Effort normal and breath sounds normal. No respiratory distress. She has no wheezes. She has no rales. She exhibits no tenderness.  Abdominal: Soft. Bowel sounds are normal. She exhibits no distension and no mass. There is no tenderness. There is no rebound and no guarding.  Musculoskeletal: Normal range of motion. She exhibits edema. She exhibits no tenderness.  rLE muscle wasting + Bilateral edema ++  Lymphadenopathy:    She has no cervical adenopathy.  Neurological: She is alert and oriented to person, place,  and time. She has normal reflexes. No cranial nerve deficit. She exhibits normal muscle tone. Coordination normal.  Skin: Skin is warm and dry. No rash noted. She is not diaphoretic. No erythema. No pallor.  Psychiatric: She has a normal mood and affect. Her behavior is normal. Judgment and thought content normal.  Vitals reviewed.   Filed Vitals:   02/25/14 1552  BP: 146/70  Pulse: 71  Height: 5' (1.524 m)  Weight: 134 lb 12.8 oz (61.145 kg)  SpO2: 98%         Assessment & Plan:     ICD-9-CM ICD-10-CM   1. Dyspnea and respiratory abnormality 786.09 R06.00 D-Dimer, Quantitative    R06.89    #Shortness of breath  - concern shortness of breath might be due to blood clot in lung or body physical deconditioning  - do D-dimer blood test  - if test , positive will do further workup to confirm/rule out blood clot   - if test negative, then will do PFT breathing test   #Followup Based on d-dimer blood  test   Dr. Brand Males, M.D., [facility].C.P Pulmonary and Critical Care Medicine Staff Physician [place] [facility] Pager: [phone], If no answer or between  15:00h - 7:00h: call [phone]  02/25/2014 4:25 PM

## 2014-02-27 ENCOUNTER — Telehealth: Payer: Self-pay | Admitting: Internal Medicine

## 2014-02-27 DIAGNOSIS — R0600 Dyspnea, unspecified: Secondary | ICD-10-CM

## 2014-02-27 NOTE — Telephone Encounter (Signed)
D-dimer from Friday 02/25/14 is slightly  High - needs PE/DVT ruled out. For her dyspnea. [facility] was supposed to page the [phone] pager per my instruction but I do not see a documentation. Anwyays, call patient first thing Monday 02/28/14 and get a VQ scan (with CXR) with Duplex LE rule out DVT  Dr. Brand Males, M.D., [facility].C.P Pulmonary and Critical Care Medicine Staff Physician [place] [facility] Pager: [phone], If no answer or between  15:00h - 7:00h: call [phone]  02/27/2014 11:44 PM

## 2014-02-28 ENCOUNTER — Ambulatory Visit (HOSPITAL_COMMUNITY): Payer: Medicare Other

## 2014-02-28 ENCOUNTER — Ambulatory Visit (HOSPITAL_COMMUNITY)
Admission: RE | Admit: 2014-02-28 | Discharge: 2014-02-28 | Disposition: A | Discharge status: Home / Self Care | Payer: Medicare Other | Source: Ambulatory Visit | Attending: Internal Medicine | Admitting: Internal Medicine

## 2014-02-28 DIAGNOSIS — R791 Abnormal coagulation profile: Secondary | ICD-10-CM

## 2014-02-28 DIAGNOSIS — R0600 Dyspnea, unspecified: Secondary | ICD-10-CM

## 2014-02-28 DIAGNOSIS — R0602 Shortness of breath: Secondary | ICD-10-CM | POA: Insufficient documentation

## 2014-02-28 DIAGNOSIS — R609 Edema, unspecified: Secondary | ICD-10-CM

## 2014-02-28 MED ORDER — TECHNETIUM TC 99M DIETHYLENETRIAME-PENTAACETIC ACID: 40.0000 | Freq: Once | INTRAVENOUS | Status: AC | PRN

## 2014-02-28 MED ORDER — TECHNETIUM TO 99M ALBUMIN AGGREGATED
6.0000 | Freq: Once | INTRAVENOUS | Status: AC | PRN
Start: 2014-02-28 — End: 2014-02-28
  Administered 2014-02-28: 6 via INTRAVENOUS

## 2014-02-28 NOTE — Progress Notes (Addendum)
*  PRELIMINARY RESULTS* Vascular Ultrasound Lower extremity venous duplex has been completed.  Preliminary findings: No evidence of DVT.  Called results to [place].    Landry Mellow, RDMS, RVT  02/28/2014, 3:52 PM

## 2014-02-28 NOTE — Telephone Encounter (Signed)
Called and spoke to pt. Informed pt of the results and recs per MR. Orders placed. Pt verbalized understanding and denied any further questions or concerns at this time.    PCC's please schedule as soon as possible. Thanks.

## 2014-02-28 NOTE — Telephone Encounter (Signed)
Pt sched today 02/28/14 @ 2 pm for CXR, 2:30 for VQ Scan & lower extremity duplex @3 :[facility], all @ [facility].  Spoke with pt & she is aware of appts & location info.

## 2014-03-01 ENCOUNTER — Telehealth: Payer: Self-pay | Admitting: Internal Medicine

## 2014-03-01 DIAGNOSIS — R0689 Other abnormalities of breathing: Principal | ICD-10-CM

## 2014-03-01 DIAGNOSIS — R0600 Dyspnea, unspecified: Secondary | ICD-10-CM

## 2014-03-01 NOTE — Telephone Encounter (Signed)
Let Jennifer Green know that CXR, VQ scan and Duplex LE - all normal. No blood clot. Dyspnea likely related to post op deconditioning (stres stest normal in 2012). She should get full PFT and return to see Tammy P or me  Thanks  Dr. Brand Males, M.D., [facility].C.P Pulmonary and Critical Care Medicine Staff Physician [place] [facility] Pager: [phone], If no answer or between  15:00h - 7:00h: call [phone]  03/01/2014 6:55 AM

## 2014-03-01 NOTE — Telephone Encounter (Signed)
Spoke with pt and advised of test results per Dr Chase Caller.  Pt states she will be out of town for 1 month and would like f/u when she returns.  Pt scheduled for pft and f/u.

## 2014-03-23 ENCOUNTER — Encounter: Payer: Self-pay | Admitting: Cardiology

## 2014-04-05 ENCOUNTER — Ambulatory Visit: Payer: Medicare Other | Admitting: Internal Medicine

## 2014-04-22 ENCOUNTER — Ambulatory Visit: Payer: Medicare Other | Admitting: Cardiology

## 2014-06-23 ENCOUNTER — Ambulatory Visit (INDEPENDENT_AMBULATORY_CARE_PROVIDER_SITE_OTHER): Payer: Medicare Other | Admitting: Cardiology

## 2014-06-23 ENCOUNTER — Encounter: Payer: Self-pay | Admitting: Cardiology

## 2014-06-23 VITALS — BP 148/84 | HR 60 | Ht 60.0 in | Wt 133.6 lb

## 2014-06-23 DIAGNOSIS — I10 Essential (primary) hypertension: Secondary | ICD-10-CM

## 2014-06-23 DIAGNOSIS — R609 Edema, unspecified: Secondary | ICD-10-CM

## 2014-06-23 DIAGNOSIS — I2510 Atherosclerotic heart disease of native coronary artery without angina pectoris: Secondary | ICD-10-CM

## 2014-06-23 DIAGNOSIS — I493 Ventricular premature depolarization: Secondary | ICD-10-CM

## 2014-06-23 NOTE — Progress Notes (Signed)
Jennifer Green Date of Birth: Sep 21, 1940 Medical Record #[id number]  History of Present Illness: Jennifer Green is seen today for followup of PVCs. Extensive evaluation last October 2012 including echocardiogram and stress Myoview study were normal. She did have coronary calcification noted on chest CT in 2/13. No history of angina. She states she is doing very well from a cardiac standpoint. No significant palpitations.   No dizziness or syncope. No chest pain or SOB. She did fracture her ankle in Sept. Was in a cast until Jan. Afterwards complained of swelling in ankles. V/Q scan was normal and LE venous doppler showed no DVT. Swelling better now but still occurs if she is on her feet a lot.   Current Outpatient Prescriptions on File Prior to Visit  Medication Sig Dispense Refill  . allopurinol (ZYLOPRIM) 300 MG tablet Take 1 tablet by mouth  daily 90 tablet 3  . amLODipine (NORVASC) 2.5 MG tablet TAKE 1 TABLET BY MOUTH EVERY DAY 90 tablet 3  . atenolol (TENORMIN) 25 MG tablet Take 1 tablet by mouth  daily 90 tablet 3  . Calcium Carbonate-Vitamin D (CALCIUM + D PO) Take 1 tablet by mouth daily.     [name] latanoprost (XALATAN) 0.005 % ophthalmic solution Place 1 drop into both eyes daily.     . Magnesium 400 MG CAPS Take 400 mg by mouth every other day.    . Multiple Vitamins-Iron (MULTIVITAMINS WITH IRON) TABS Take 1 tablet by mouth daily.      [name] omeprazole-sodium bicarbonate (ZEGERID) 40-1100 MG per capsule Take 1 capsule by mouth daily before breakfast.    . ranitidine (ZANTAC) 300 MG tablet Take 1 tablet by mouth at  bedtime 90 tablet 3  . traZODone (DESYREL) 50 MG tablet Take 1 tablet by mouth at  bedtime as needed 90 tablet 3   No current facility-administered medications on file prior to visit.    Allergies  Allergen Reactions  . Indomethacin     Rapid Heart Beat  . Promethazine Hcl Other (See Comments)    Couldn't stay still    Past Medical History  Diagnosis Date  . Hypertension    . OP (osteoporosis)   . GERD (gastroesophageal reflux disease)   . Elevated blood sugar     790.21  . Gout   . Murmur   . Insomnia   . Allergy   . Glaucoma   . PVC (premature ventricular contraction)   . Hypokalemia   . Hypomagnesemia     Past Surgical History  Procedure Laterality Date  . Eye surgery      right and left  . Cervical fusion  05/2010    History  Smoking status  . Never Smoker   Smokeless tobacco  . Not on file    History  Alcohol Use No    Family History  Problem Relation Age of Onset  . Pneumonia Mother   . Hypertension Mother   . Heart disease Father     Review of Systems: As noted in history of present illness..  All other systems were reviewed and are negative.  Physical Exam: BP 148/84 mmHg  Pulse 60  Ht 5' (1.524 m)  Wt 133 lb 9.6 oz (60.601 kg)  BMI 26.09 kg/m2  The HEENT exam is unremarkable. The carotids are 2+ without bruits.  There is no thyromegaly.  There is no JVD.  The lungs are clear.   The heart exam reveals a regular rate with a normal S1 and S2.  There are no murmurs, gallops, or rubs.  The PMI is not displaced.    Exam of the legs reveal no clubbing, cyanosis, or edema.  The legs are without rashes.  The distal pulses are intact.  Cranial nerves II - XII are intact.  The gait is normal.  LABORATORY DATA:   Lab Results  Component Value Date   WBC 4.7 07/04/2013   HGB 12.3 07/04/2013   HCT 34.4* 07/04/2013   PLT 174 07/04/2013   GLUCOSE 156* 07/04/2013   CHOL 177 04/25/2011   TRIG 148.0 04/25/2011   HDL 45.40 04/25/2011   LDLDIRECT 142.1 04/25/2006   LDLCALC 102* 04/25/2011   ALT 41* 10/11/2013   AST 32 10/11/2013   NA 143 07/04/2013   K 3.8 07/04/2013   CL 103 07/04/2013   CREATININE 0.80 07/04/2013   BUN 13 07/04/2013   CO2 26 07/04/2013   TSH 2.34 10/11/2013   HGBA1C 6.1* 04/25/2006     Assessment / Plan: 1. PVCs chronic and benign. Continue beta blocker therapy  2. Coronary calcification with  normal myoview study. No symptoms of angina. Risk factor modification.  3. Edema. None on exam today. Continue sodium restriction and support hose as needed.

## 2014-06-23 NOTE — Patient Instructions (Signed)
Continue your current therapy  Wear support hose as needed for swelling  I will see you in one year

## 2014-08-09 ENCOUNTER — Other Ambulatory Visit: Payer: Self-pay | Admitting: Family Medicine

## 2014-08-09 DIAGNOSIS — Z1231 Encounter for screening mammogram for malignant neoplasm of breast: Secondary | ICD-10-CM

## 2014-08-10 ENCOUNTER — Ambulatory Visit (HOSPITAL_COMMUNITY)
Admission: RE | Admit: 2014-08-10 | Discharge: 2014-08-10 | Disposition: A | Discharge status: Home / Self Care | Payer: Medicare Other | Source: Ambulatory Visit | Attending: Family Medicine | Admitting: Family Medicine

## 2014-08-10 DIAGNOSIS — Z1231 Encounter for screening mammogram for malignant neoplasm of breast: Secondary | ICD-10-CM | POA: Diagnosis present

## 2014-12-15 ENCOUNTER — Other Ambulatory Visit: Payer: Self-pay | Admitting: Family Medicine

## 2014-12-21 ENCOUNTER — Encounter: Payer: Self-pay | Admitting: Family Medicine

## 2014-12-21 ENCOUNTER — Ambulatory Visit (INDEPENDENT_AMBULATORY_CARE_PROVIDER_SITE_OTHER): Payer: Medicare Other | Admitting: Family Medicine

## 2014-12-21 VITALS — BP 140/80 | Temp 98.1°F | Ht 60.0 in | Wt 131.0 lb

## 2014-12-21 DIAGNOSIS — Z23 Encounter for immunization: Secondary | ICD-10-CM | POA: Diagnosis not present

## 2014-12-21 DIAGNOSIS — I10 Essential (primary) hypertension: Secondary | ICD-10-CM

## 2014-12-21 DIAGNOSIS — E785 Hyperlipidemia, unspecified: Secondary | ICD-10-CM

## 2014-12-21 DIAGNOSIS — H9192 Unspecified hearing loss, left ear: Secondary | ICD-10-CM

## 2014-12-21 DIAGNOSIS — M19012 Primary osteoarthritis, left shoulder: Secondary | ICD-10-CM | POA: Insufficient documentation

## 2014-12-21 DIAGNOSIS — M109 Gout, unspecified: Secondary | ICD-10-CM

## 2014-12-21 DIAGNOSIS — K219 Gastro-esophageal reflux disease without esophagitis: Secondary | ICD-10-CM

## 2014-12-21 DIAGNOSIS — G4700 Insomnia, unspecified: Secondary | ICD-10-CM

## 2014-12-21 LAB — LIPID PANEL
CHOLESTEROL: 161 mg/dL (ref 0–200)
HDL: 42.5 mg/dL (ref >39.00)
LDL Cholesterol: 92 mg/dL (ref 0–99)
NONHDL: 118.37
TRIGLYCERIDES: 132 mg/dL (ref 0.0–149.0)
Total CHOL/HDL Ratio: 4
VLDL: 26.4 mg/dL (ref 0.0–40.0)

## 2014-12-21 LAB — POCT URINALYSIS DIPSTICK
BILIRUBIN UA: NEGATIVE
Blood, UA: NEGATIVE
GLUCOSE UA: NEGATIVE
Ketones, UA: NEGATIVE
NITRITE UA: NEGATIVE
PH UA: 7.5
Spec Grav, UA: 1.015
UROBILINOGEN UA: 0.2

## 2014-12-21 LAB — CBC WITH DIFFERENTIAL/PLATELET
Basophils Absolute: 0 10*3/uL (ref 0.0–0.1)
Basophils Relative: 0.6 % (ref 0.0–3.0)
EOS PCT: 1.9 % (ref 0.0–5.0)
Eosinophils Absolute: 0.1 10*3/uL (ref 0.0–0.7)
HCT: 43.2 % (ref 36.0–46.0)
Hemoglobin: 14.5 g/dL (ref 12.0–15.0)
LYMPHS ABS: 2 10*3/uL (ref 0.7–4.0)
Lymphocytes Relative: 44.3 % (ref 12.0–46.0)
MCHC: 33.5 g/dL (ref 30.0–36.0)
MCV: 98.6 fl (ref 78.0–100.0)
MONO ABS: 0.5 10*3/uL (ref 0.1–1.0)
Monocytes Relative: 11.5 % (ref 3.0–12.0)
Neutro Abs: 1.9 10*3/uL (ref 1.4–7.7)
Neutrophils Relative %: 41.7 % — ABNORMAL LOW (ref 43.0–77.0)
Platelets: 195 10*3/uL (ref 150.0–400.0)
RBC: 4.38 Mil/uL (ref 3.87–5.11)
RDW: 13.9 % (ref 11.5–15.5)
WBC: 4.6 10*3/uL (ref 4.0–10.5)

## 2014-12-21 LAB — BASIC METABOLIC PANEL
BUN: 21 mg/dL (ref 6–23)
CO2: 29 meq/L (ref 19–32)
Calcium: 9.4 mg/dL (ref 8.4–10.5)
Chloride: 105 mEq/L (ref 96–112)
Creatinine, Ser: 0.91 mg/dL (ref 0.40–1.20)
GFR: 63.76 mL/min (ref >60.00)
GLUCOSE: 108 mg/dL — AB (ref 70–99)
POTASSIUM: 4.1 meq/L (ref 3.5–5.1)
SODIUM: 144 meq/L (ref 135–145)

## 2014-12-21 LAB — HEPATIC FUNCTION PANEL
ALBUMIN: 4.1 g/dL (ref 3.5–5.2)
ALK PHOS: 79 U/L (ref 39–117)
ALT: 22 U/L (ref 0–35)
AST: 27 U/L (ref 0–37)
Bilirubin, Direct: 0.1 mg/dL (ref 0.0–0.3)
TOTAL PROTEIN: 6.6 g/dL (ref 6.0–8.3)
Total Bilirubin: 0.7 mg/dL (ref 0.2–1.2)

## 2014-12-21 LAB — TSH: TSH: 2.44 u[IU]/mL (ref 0.35–4.50)

## 2014-12-21 MED ORDER — ATENOLOL 25 MG PO TABS: ORAL_TABLET | ORAL | Status: DC

## 2014-12-21 MED ORDER — TRAZODONE HCL 50 MG PO TABS: ORAL_TABLET | ORAL | Status: DC

## 2014-12-21 MED ORDER — ALLOPURINOL 300 MG PO TABS: ORAL_TABLET | ORAL | Status: DC

## 2014-12-21 MED ORDER — AMLODIPINE BESYLATE 5 MG PO TABS: 5.0000 mg | ORAL_TABLET | Freq: Every day | ORAL | Status: DC

## 2014-12-21 MED ORDER — RANITIDINE HCL 300 MG PO TABS: ORAL_TABLET | ORAL | Status: DC

## 2014-12-21 NOTE — Progress Notes (Signed)
Pre visit review using our clinic review tool, if applicable. No additional management support is needed unless otherwise documented below in the visit note. 

## 2014-12-21 NOTE — Patient Instructions (Addendum)
Continue current medications  I would recommend a consult with Dr. Wylene Simmer at [facility] orthopedics because of your persistent pain and swelling of your right ankle  Also recommend a consult with Amy Frey at [facility] for evaluation of hearing loss in your left ear  Labs today...Marland KitchenMarland KitchenMarland Kitchen we will call you if there is anything abnormal  Your insurance company will not cover a bone density screening,,,,,,,,,, I would recommend walking calcium vitamin D a sugar currently doing  Jennifer Green or Jennifer Green.......... are 2 new adult nurse practitioners

## 2014-12-21 NOTE — Progress Notes (Signed)
Subjective:    Patient ID: Jennifer Green, female    DOB: 1941-01-02, 74 y.o.   MRN: [id number]  HPI Monti is a 74 year old married female nonsmoker who comes in today for general physical examination because of a history of gout, hypertension, reflux esophagitis, and sleep dysfunction and a new problem of diffuse joint pain  She gets routine eye care, dental care, BSE monthly, annual mammography, colonoscopy last was 2004. Her gastroenterologist told her she really didn't need any follow-up colonoscopies. I recommend she watch her bowel habits make sure this no bleeding and have a Hemoccult yearly. She is content with that. No family history of colon cancer or polyps. She's had a bone density in the past which was normal. She is to walk daily however she had a fracture of her right foot about 8 months ago and she still having difficulty walking. She's been to physical therapy twice. She's very frustrated by the inability to walk normally. Discussed with her to get a second opinion from Dr. Wylene Simmer.  Tetanus booster given today along with a tetanus booster and Pneumovax 13  Cognitive function normal she cannot walk daily because of her foot pain, home health safety reviewed no issues identified, no guns in the house, she does have a healthcare power of attorney and living well.  Medication list reviewed was correct   Review of Systems  Constitutional: Negative.   HENT: Negative.   Eyes: Negative.   Respiratory: Negative.   Cardiovascular: Negative.   Gastrointestinal: Negative.   Endocrine: Negative.   Genitourinary: Negative.   Musculoskeletal: Negative.   Skin: Negative.   Allergic/Immunologic: Negative.   Neurological: Negative.   Hematological: Negative.   Psychiatric/Behavioral: Negative.        Objective:   Physical Exam  Constitutional: She is oriented to person, place, and time. She appears well-developed and well-nourished.  HENT:  Head: Normocephalic and  atraumatic.  Right Ear: External ear normal.  Left Ear: External ear normal.  Nose: Nose normal.  Mouth/Throat: Oropharynx is clear and moist.  Eyes: EOM are normal. Pupils are equal, round, and reactive to light.  Neck: Normal range of motion. Neck supple. No JVD present. No tracheal deviation present. No thyromegaly present.  Cardiovascular: Normal rate, regular rhythm, normal heart sounds and intact distal pulses.  Exam reveals no gallop and no friction rub.   No murmur heard. No carotid aortic bruits for full pulses 2+ and symmetrical  Pulmonary/Chest: Effort normal and breath sounds normal. No stridor. No respiratory distress. She has no wheezes. She has no rales. She exhibits no tenderness.  Abdominal: Soft. Bowel sounds are normal. She exhibits no distension and no mass. There is no tenderness. There is no rebound and no guarding.  Genitourinary: Guaiac negative stool.  Rectal normal guaiac negative. She had a pelvic and Pap 2 years ago which were normal therefore recommend every 3 years. No personal history of any cervical abnormalities  Musculoskeletal: Normal range of motion.  Swelling of the right ankle with decreased range of motion normal pulses  Lymphadenopathy:    She has no cervical adenopathy.  Neurological: She is alert and oriented to person, place, and time. She has normal reflexes. No cranial nerve deficit. She exhibits normal muscle tone. Coordination normal.  Skin: Skin is warm and dry. No rash noted. No erythema. No pallor.  Psychiatric: She has a normal mood and affect. Her behavior is normal. Judgment and thought content normal.  Nursing note and vitals reviewed.  Assessment & Plan:  Hypertension at goal,,,,,,, continue current therapy  Osteoarthritis,,,,,,,, Motrin 400 twice a day with food  Persistent pain right ankle from stress fracture,,,,,,,,, consult with Dr. Wylene Simmer for second opinion  History of gout continue allopurinol  Reflux  esophagitis,,,,,,,,, continue Zantac 300 milligrams daily at bedtime  Sleep dysfunction,,,,,,,,,, continue trazodone 50 mg daily  History of normal bone density 5 years ago,,,, repeat bone density,

## 2015-03-28 ENCOUNTER — Emergency Department (HOSPITAL_BASED_OUTPATIENT_CLINIC_OR_DEPARTMENT_OTHER): Payer: Medicare Other

## 2015-03-28 ENCOUNTER — Encounter (HOSPITAL_BASED_OUTPATIENT_CLINIC_OR_DEPARTMENT_OTHER): Payer: Self-pay | Admitting: *Deleted

## 2015-03-28 ENCOUNTER — Emergency Department (HOSPITAL_BASED_OUTPATIENT_CLINIC_OR_DEPARTMENT_OTHER)
Admission: EM | Admit: 2015-03-28 | Discharge: 2015-03-28 | Disposition: A | Discharge status: Home / Self Care | Payer: Medicare Other | Attending: Emergency Medicine | Admitting: Emergency Medicine

## 2015-03-28 DIAGNOSIS — Z8639 Personal history of other endocrine, nutritional and metabolic disease: Secondary | ICD-10-CM | POA: Insufficient documentation

## 2015-03-28 DIAGNOSIS — M109 Gout, unspecified: Secondary | ICD-10-CM | POA: Insufficient documentation

## 2015-03-28 DIAGNOSIS — K219 Gastro-esophageal reflux disease without esophagitis: Secondary | ICD-10-CM | POA: Diagnosis not present

## 2015-03-28 DIAGNOSIS — M7981 Nontraumatic hematoma of soft tissue: Secondary | ICD-10-CM | POA: Insufficient documentation

## 2015-03-28 DIAGNOSIS — G4730 Sleep apnea, unspecified: Secondary | ICD-10-CM | POA: Insufficient documentation

## 2015-03-28 DIAGNOSIS — Z79899 Other long term (current) drug therapy: Secondary | ICD-10-CM | POA: Insufficient documentation

## 2015-03-28 DIAGNOSIS — R011 Cardiac murmur, unspecified: Secondary | ICD-10-CM | POA: Insufficient documentation

## 2015-03-28 DIAGNOSIS — M79671 Pain in right foot: Secondary | ICD-10-CM | POA: Insufficient documentation

## 2015-03-28 DIAGNOSIS — I10 Essential (primary) hypertension: Secondary | ICD-10-CM

## 2015-03-28 MED ORDER — HYDROCODONE-ACETAMINOPHEN 5-325 MG PO TABS
1.0000 | ORAL_TABLET | Freq: Once | ORAL | Status: AC
  Administered 2015-03-28: 1 via ORAL
  Filled 2015-03-28: qty 1

## 2015-03-28 MED ORDER — HYDROCODONE-ACETAMINOPHEN 5-325 MG PO TABS: 1.0000 | ORAL_TABLET | Freq: Four times a day (QID) | ORAL | Status: DC | PRN

## 2015-03-28 MED FILL — HYDROCODON-APAP 5-325: 5-325 | 4 days supply | Qty: 15 | Fill #0

## 2015-03-28 NOTE — ED Notes (Signed)
Right foot pain x 3 weeks. No known injury. Recent fracture.

## 2015-03-28 NOTE — ED Provider Notes (Signed)
CSN: [id number]     Arrival date & time 03/28/15  1351 History   First MD Initiated Contact with Patient 03/28/15 1620     Chief Complaint  Patient presents with  . Foot Pain    HPI   3 female presents today with complaints of foot pain. Patient reports that 18 months ago she had foot and ankle pain, was seen numerous times diagnosed with gout and negative plain films of the foot. She reports that she had an MRI with orthopedics that showed a fracture of the second metatarsal. She reports at that time she had swelling and pain to the lateral foot and ankle, reports that intermittently she does have the swelling and pain. She reports that over the last several days she states that limping, with stay inability to bear weight. She notes bruising to the lateral aspect of her right foot. She denies any loss of sensation. She reports she is able to flex and extend the foot but does have pain. She denies any infectious etiology, no trauma to the foot, no change in activity level. She denies any systemic symptoms of infection. She was seen by Dr. Drema Dallas of orthopedics, has not contacted him with today's presentation.  Past Medical History  Diagnosis Date  . Hypertension   . OP (osteoporosis)   . GERD (gastroesophageal reflux disease)   . Elevated blood sugar     790.21  . Gout   . Murmur   . Insomnia   . Allergy   . Glaucoma   . PVC (premature ventricular contraction)   . Hypokalemia   . Hypomagnesemia    Past Surgical History  Procedure Laterality Date  . Eye surgery      right and left  . Cervical fusion  05/2010   Family History  Problem Relation Age of Onset  . Pneumonia Mother   . Hypertension Mother   . Heart disease Father    Social History  Substance Use Topics  . Smoking status: Never Smoker   . Smokeless tobacco: None  . Alcohol Use: No   OB History    No data available     Review of Systems  All other systems reviewed and are negative.  Allergies  Indomethacin  and Promethazine hcl  Home Medications   Prior to Admission medications   Medication Sig Start Date End Date Taking? Authorizing Provider  allopurinol (ZYLOPRIM) 300 MG tablet Take 1 tablet by mouth  daily 12/21/14   Dorena Cookey, MD  amLODipine (NORVASC) 5 MG tablet Take 1 tablet (5 mg total) by mouth daily. 12/21/14   Dorena Cookey, MD  apraclonidine (IOPIDINE) 0.5 % ophthalmic solution  12/15/14   Historical Provider, MD  atenolol (TENORMIN) 25 MG tablet Take 1 tablet by mouth  daily 12/21/14   Dorena Cookey, MD  Calcium Carb-Cholecalciferol [phone] MG-UNIT CAPS Take by mouth.    Historical Provider, MD  Calcium Carbonate-Vitamin D (CALCIUM + D PO) Take 1 tablet by mouth daily.     Historical Provider, MD  dorzolamide-timolol (COSOPT) 22.3-6.8 MG/ML ophthalmic solution Place 1 drop into both eyes 2 (two) times daily. 05/23/14   Historical Provider, MD  HYDROcodone-acetaminophen (NORCO/VICODIN) 5-325 MG tablet Take 1 tablet by mouth every 6 (six) hours as needed. 03/28/15   Okey Regal, PA-C  latanoprost (XALATAN) 0.005 % ophthalmic solution Place 1 drop into both eyes daily.  03/26/10   Historical Provider, MD  Magnesium 400 MG CAPS Take 400 mg by mouth every other day.  Historical Provider, MD  Multiple Vitamins-Iron (MULTIVITAMINS WITH IRON) TABS Take 1 tablet by mouth daily.      Historical Provider, MD  Multiple Vitamins-Minerals (PRESERVISION AREDS 2 PO) Take by mouth.    Historical Provider, MD  pilocarpine (PILOCAR) 4 % ophthalmic solution Place 1 drop into both eyes 2 (two) times daily. 05/02/14   Historical Provider, MD  ranitidine (ZANTAC) 300 MG tablet Take 1 tablet by mouth at  bedtime 12/21/14   Dorena Cookey, MD  traZODone (DESYREL) 50 MG tablet Take 1 tablet by mouth at  bedtime as needed 12/21/14   Dorena Cookey, MD   BP 179/82 mmHg  Pulse 64  Temp(Src) 98.3 F (36.8 C) (Oral)  Resp 18  Ht 5' (1.524 m)  Wt 58.968 kg  BMI 25.39 kg/m2  SpO2 95%   Physical Exam   Constitutional: She is oriented to person, place, and time. She appears well-developed and well-nourished.  HENT:  Head: Normocephalic and atraumatic.  Eyes: Conjunctivae are normal. Pupils are equal, round, and reactive to light. Right eye exhibits no discharge. Left eye exhibits no discharge. No scleral icterus.  Neck: Normal range of motion. No JVD present. No tracheal deviation present.  Pulmonary/Chest: Effort normal. No stridor.  Musculoskeletal:  Obvious bruising to the right lateral ankle and proximal foot, tenderness to palpation of the lateral foot, second metatarsal, and lateral ankle. Patient is able to plantarflex and dorsiflex the ankle, sensation intact, cap refill less than 3 seconds, pulses 2+  Neurological: She is alert and oriented to person, place, and time. Coordination normal.  Skin: Skin is warm and dry.  Psychiatric: She has a normal mood and affect. Her behavior is normal. Judgment and thought content normal.  Nursing note and vitals reviewed.   ED Course  Procedures (including critical care time) Labs Review Labs Reviewed - No data to display  Imaging Review Dg Ankle Complete Right  03/28/2015  CLINICAL DATA:  RIGHT ankle pain and swelling with redness for 2 weeks EXAM: RIGHT ANKLE - COMPLETE 3+ VIEW COMPARISON:  None FINDINGS: Scattered soft tissue swelling lower leg and ankle. Bones appear demineralized. Ankle mortise intact. No acute fracture, dislocation, or bone destruction. Plantar and Achilles insertion calcaneal spur formation. IMPRESSION: Osseous demineralization with scattered soft tissue swelling and calcaneal spurring. No acute osseous abnormalities. Electronically Signed   By: Lavonia Dana M.D.   On: 03/28/2015 14:56   Dg Foot Complete Right  03/28/2015  CLINICAL DATA:  Anterior and lateral right ankle and foot pain and swelling for 2 weeks. History of prior foot fracture 18 months ago. Initial encounter. EXAM: RIGHT FOOT COMPLETE - 3+ VIEW COMPARISON:   None. FINDINGS: No acute bony or joint abnormality is identified. The patient has first MTP and midfoot osteoarthritis. Remote healed fracture of the diaphysis of the second metatarsal is identified in anatomic position and alignment. Bones appear osteopenic. Calcaneal spurring is noted. Soft tissues are unremarkable. IMPRESSION: No acute abnormality. Midfoot and first MTP osteoarthritis. Osteopenia. Calcaneal spurring. Remote healed second metatarsal fracture. Electronically Signed   By: Inge Rise M.D.   On: 03/28/2015 14:25   I have personally reviewed and evaluated these images and lab results as part of my medical decision-making.   EKG Interpretation None      MDM   Final diagnoses:  Right foot pain  Essential hypertension    Labs:  Imaging: DG foot right, Dg ankle complete- no acute abnormalities   Consults:  Therapeutics:  Discharge Meds:  Assessment/Plan: Patient presents with pain to her foot. This is likely related to her previous metatarsal fracture as this has been a recurring theme for her. Patient is unable to bear weight without pain, patient has a walker at home, she'll be given pain medication, and encouraged follow-up with orthopedics tomorrow as previously scheduled. Patient has no signs of compartment syndrome, no signs of infectious etiology. She is given strict return precautions. Patient was hypertensive here in the ED, she is encouraged to recheck her blood pressure tomorrow, scheduled follow-up evaluation with primary care if hypertension presents.        Okey Regal, PA-C 03/29/15 0011  Okey Regal, PA-C 03/29/15 [id number]  Orlie Dakin, MD 03/29/15 [id number]

## 2015-03-28 NOTE — ED Provider Notes (Signed)
Patient complains of pain at proximal right foot intermittently for the past 1 year since she suffered a fracture. Pain usually goes away with ibuprofen. Pain is worse with weightbearing improved with elevation and with ice applied. On exam patient is alert nontoxic-appearing right lower extremity she appears ecchymotic over the lateral ankle and proximal foot with corresponding tenderness, minimally warm. DP pulse 2+ good capillary refill. Doubt infection. Symptoms are chronic. Patient has follow-up with [facility] orthopedics tomorrow. X-rays reviewed by me  Orlie Dakin, MD 03/28/15 [phone]

## 2015-03-28 NOTE — Discharge Instructions (Signed)
Cryotherapy °Cryotherapy means treatment with cold. Ice or gel packs can be used to reduce both pain and swelling. Ice is the most helpful within the first 24 to 48 hours after an injury or flare-up from overusing a muscle or joint. Sprains, strains, spasms, burning pain, shooting pain, and aches can all be eased with ice. Ice can also be used when recovering from surgery. Ice is effective, has very few side effects, and is safe for most people to use. °PRECAUTIONS  °Ice is not a safe treatment option for people with: °· Raynaud phenomenon. This is a condition affecting small blood vessels in the extremities. Exposure to cold may cause your problems to return. °· Cold hypersensitivity. There are many forms of cold hypersensitivity, including: °¨ Cold urticaria. Red, itchy hives appear on the skin when the tissues begin to warm after being iced. °¨ Cold erythema. This is a red, itchy rash caused by exposure to cold. °¨ Cold hemoglobinuria. Red blood cells break down when the tissues begin to warm after being iced. The hemoglobin that carry oxygen are passed into the urine because they cannot combine with blood proteins fast enough. °· Numbness or altered sensitivity in the area being iced. °If you have any of the following conditions, do not use ice until you have discussed cryotherapy with your caregiver: °· Heart conditions, such as arrhythmia, angina, or chronic heart disease. °· High blood pressure. °· Healing wounds or open skin in the area being iced. °· Current infections. °· Rheumatoid arthritis. °· Poor circulation. °· Diabetes. °Ice slows the blood flow in the region it is applied. This is beneficial when trying to stop inflamed tissues from spreading irritating chemicals to surrounding tissues. However, if you expose your skin to cold temperatures for too long or without the proper protection, you can damage your skin or nerves. Watch for signs of skin damage due to cold. °HOME CARE INSTRUCTIONS °Follow  these tips to use ice and cold packs safely. °· Place a dry or damp towel between the ice and skin. A damp towel will cool the skin more quickly, so you may need to shorten the time that the ice is used. °· For a more rapid response, add gentle compression to the ice. °· Ice for no more than 10 to 20 minutes at a time. The bonier the area you are icing, the less time it will take to get the benefits of ice. °· Check your skin after 5 minutes to make sure there are no signs of a poor response to cold or skin damage. °· Rest 20 minutes or more between uses. °· Once your skin is numb, you can end your treatment. You can test numbness by very lightly touching your skin. The touch should be so light that you do not see the skin dimple from the pressure of your fingertip. When using ice, most people will feel these normal sensations in this order: cold, burning, aching, and numbness. °· Do not use ice on someone who cannot communicate their responses to pain, such as small children or people with dementia. °HOW TO MAKE AN ICE PACK °Ice packs are the most common way to use ice therapy. Other methods include ice massage, ice baths, and cryosprays. Muscle creams that cause a cold, tingly feeling do not offer the same benefits that ice offers and should not be used as a substitute unless recommended by your caregiver. °To make an ice pack, do one of the following: °· Place crushed ice or a   bag of frozen vegetables in a sealable plastic bag. Squeeze out the excess air. Place this bag inside another plastic bag. Slide the bag into a pillowcase or place a damp towel between your skin and the bag.  Mix 3 parts water with 1 part rubbing alcohol. Freeze the mixture in a sealable plastic bag. When you remove the mixture from the freezer, it will be slushy. Squeeze out the excess air. Place this bag inside another plastic bag. Slide the bag into a pillowcase or place a damp towel between your skin and the bag. SEEK MEDICAL CARE  IF:  You develop white spots on your skin. This may give the skin a blotchy (mottled) appearance.  Your skin turns blue or pale.  Your skin becomes waxy or hard.  Your swelling gets worse. MAKE SURE YOU:   Understand these instructions.  Will watch your condition.  Will get help right away if you are not doing well or get worse.   This information is not intended to replace advice given to you by your health care provider. Make sure you discuss any questions you have with your health care provider.   Document Released: 09/24/2010 Document Revised: 02/18/2014 Document Reviewed: 09/24/2010 Elsevier Interactive Patient Education 2016 [employer].   Please follow up with orthopedic specialist tomorrow as previously scheduled. Please use medication only as directed, please return immediately if any new or worsening signs symptoms present.

## 2015-03-28 NOTE — ED Notes (Signed)
Ice pack given

## 2015-05-10 ENCOUNTER — Other Ambulatory Visit: Payer: Self-pay | Admitting: Family Medicine

## 2015-05-10 DIAGNOSIS — R739 Hyperglycemia, unspecified: Secondary | ICD-10-CM

## 2015-05-11 ENCOUNTER — Other Ambulatory Visit (INDEPENDENT_AMBULATORY_CARE_PROVIDER_SITE_OTHER): Payer: Medicare Other

## 2015-05-11 DIAGNOSIS — R739 Hyperglycemia, unspecified: Secondary | ICD-10-CM

## 2015-05-11 LAB — BASIC METABOLIC PANEL
BUN: 20 mg/dL (ref 6–23)
CO2: 27 meq/L (ref 19–32)
Calcium: 9.5 mg/dL (ref 8.4–10.5)
Chloride: 103 mEq/L (ref 96–112)
Creatinine, Ser: 0.9 mg/dL (ref 0.40–1.20)
GFR: 64.52 mL/min (ref >60.00)
GLUCOSE: 105 mg/dL — AB (ref 70–99)
POTASSIUM: 4.1 meq/L (ref 3.5–5.1)
SODIUM: 139 meq/L (ref 135–145)

## 2015-05-11 LAB — HEMOGLOBIN A1C: Hgb A1c MFr Bld: 5.9 % (ref 4.6–6.5)

## 2015-06-28 ENCOUNTER — Ambulatory Visit (INDEPENDENT_AMBULATORY_CARE_PROVIDER_SITE_OTHER): Payer: Medicare Other | Admitting: Cardiology

## 2015-06-28 ENCOUNTER — Encounter: Payer: Self-pay | Admitting: Cardiology

## 2015-06-28 VITALS — BP 146/78 | HR 55 | Ht 60.0 in | Wt 128.0 lb

## 2015-06-28 DIAGNOSIS — I2510 Atherosclerotic heart disease of native coronary artery without angina pectoris: Secondary | ICD-10-CM

## 2015-06-28 DIAGNOSIS — I10 Essential (primary) hypertension: Secondary | ICD-10-CM

## 2015-06-28 DIAGNOSIS — I493 Ventricular premature depolarization: Secondary | ICD-10-CM

## 2015-06-28 NOTE — Patient Instructions (Signed)
Continue your current therapy  I will see you in one year   

## 2015-06-28 NOTE — Progress Notes (Signed)
Jennifer Green Date of Birth: [birth year] Medical Record [id number]  History of Present Illness: Jennifer Green is seen today for followup of PVCs. Extensive evaluation in October 2012 including echocardiogram and stress Myoview study were normal. She did have coronary calcification noted on chest CT in 2/13. No history of angina.  She states she is doing very well from a cardiac standpoint. She has rare palpitations. This doesn't really bother her. She did have dizziness for a couple of days last week but this resolved.  No chest pain or SOB.   Current Outpatient Prescriptions on File Prior to Visit  Medication Sig Dispense Refill  . allopurinol (ZYLOPRIM) 300 MG tablet Take 1 tablet by mouth  daily 90 tablet 3  . amLODipine (NORVASC) 5 MG tablet Take 1 tablet (5 mg total) by mouth daily. 100 tablet 3  . apraclonidine (IOPIDINE) 0.5 % ophthalmic solution     . atenolol (TENORMIN) 25 MG tablet Take 1 tablet by mouth  daily 90 tablet 3  . Calcium Carb-Cholecalciferol [phone] MG-UNIT CAPS Take by mouth.    . Calcium Carbonate-Vitamin D (CALCIUM + D PO) Take 1 tablet by mouth daily.     . dorzolamide-timolol (COSOPT) 22.3-6.8 MG/ML ophthalmic solution Place 1 drop into both eyes 2 (two) times daily.    [name] latanoprost (XALATAN) 0.005 % ophthalmic solution Place 1 drop into both eyes daily.     . Magnesium 400 MG CAPS Take 400 mg by mouth every other day.    . Multiple Vitamins-Iron (MULTIVITAMINS WITH IRON) TABS Take 1 tablet by mouth daily.      . Multiple Vitamins-Minerals (PRESERVISION AREDS 2 PO) Take by mouth.    . pilocarpine (PILOCAR) 4 % ophthalmic solution Place 1 drop into both eyes 2 (two) times daily.    . ranitidine (ZANTAC) 300 MG tablet Take 1 tablet by mouth at  bedtime 90 tablet 3  . traZODone (DESYREL) 50 MG tablet Take 1 tablet by mouth at  bedtime as needed 90 tablet 3   No current facility-administered medications on file prior to visit.    Allergies  Allergen Reactions   . Indomethacin     Rapid Heart Beat  . Promethazine Hcl Other (See Comments)    Couldn't stay still    Past Medical History  Diagnosis Date  . Hypertension   . OP (osteoporosis)   . GERD (gastroesophageal reflux disease)   . Elevated blood sugar     790.21  . Gout   . Murmur   . Insomnia   . Allergy   . Glaucoma   . PVC (premature ventricular contraction)   . Hypokalemia   . Hypomagnesemia     Past Surgical History  Procedure Laterality Date  . Eye surgery      right and left  . Cervical fusion  05/2010    History  Smoking status  . Never Smoker   Smokeless tobacco  . Not on file    History  Alcohol Use No    Family History  Problem Relation Age of Onset  . Pneumonia Mother   . Hypertension Mother   . Heart disease Father     Review of Systems: As noted in history of present illness..  All other systems were reviewed and are negative.  Physical Exam: BP 146/78 mmHg  Pulse 55  Ht 5' (1.524 m)  Wt 58.06 kg (128 lb)  BMI 25.00 kg/m2  The HEENT exam is unremarkable. The carotids are 2+ without bruits.  There is no thyromegaly.  There is no JVD.  The lungs are clear.   The heart exam reveals a regular rate with a normal S1 and S2.  There are no murmurs, gallops, or rubs.  The PMI is not displaced.    Exam of the legs reveal no clubbing, cyanosis, or edema.  The legs are without rashes.  The distal pulses are intact.  Cranial nerves II - XII are intact.  The gait is normal.  LABORATORY DATA:   Lab Results  Component Value Date   WBC 4.6 12/21/2014   HGB 14.5 12/21/2014   HCT 43.2 12/21/2014   PLT 195.0 12/21/2014   GLUCOSE 105* 05/11/2015   CHOL 161 12/21/2014   TRIG 132.0 12/21/2014   HDL 42.50 12/21/2014   LDLDIRECT 142.1 04/25/2006   LDLCALC 92 12/21/2014   ALT 22 12/21/2014   AST 27 12/21/2014   NA 139 05/11/2015   K 4.1 05/11/2015   CL 103 05/11/2015   CREATININE 0.90 05/11/2015   BUN 20 05/11/2015   CO2 27 05/11/2015   TSH 2.44  12/21/2014   HGBA1C 5.9 05/11/2015   Ecg reviewed from 12/21/14 shows NSR with frequent PVCs. Nonspecific TWA. I have personally reviewed and interpreted this study.   Assessment / Plan: 1. PVCs chronic and benign. Continue beta blocker therapy. She is minimally symptomatic.   2. Coronary calcification with normal myoview study. No symptoms of angina. Risk factor modification.  Follow up in one year.

## 2015-09-05 ENCOUNTER — Other Ambulatory Visit: Payer: Self-pay | Admitting: Family Medicine

## 2015-09-05 DIAGNOSIS — Z1231 Encounter for screening mammogram for malignant neoplasm of breast: Secondary | ICD-10-CM

## 2015-09-08 ENCOUNTER — Ambulatory Visit
Admission: RE | Admit: 2015-09-08 | Discharge: 2015-09-08 | Disposition: A | Discharge status: Home / Self Care | Payer: Medicare Other | Source: Ambulatory Visit | Attending: Family Medicine | Admitting: Family Medicine

## 2015-09-08 DIAGNOSIS — Z1231 Encounter for screening mammogram for malignant neoplasm of breast: Secondary | ICD-10-CM

## 2015-10-06 ENCOUNTER — Encounter: Payer: Self-pay | Admitting: Family Medicine

## 2015-10-06 ENCOUNTER — Ambulatory Visit (INDEPENDENT_AMBULATORY_CARE_PROVIDER_SITE_OTHER): Payer: Medicare Other | Admitting: Family Medicine

## 2015-10-06 VITALS — BP 120/80 | HR 55 | Resp 12 | Ht 60.0 in | Wt 131.1 lb

## 2015-10-06 DIAGNOSIS — R2681 Unsteadiness on feet: Secondary | ICD-10-CM | POA: Diagnosis not present

## 2015-10-06 DIAGNOSIS — K219 Gastro-esophageal reflux disease without esophagitis: Secondary | ICD-10-CM

## 2015-10-06 DIAGNOSIS — I10 Essential (primary) hypertension: Secondary | ICD-10-CM

## 2015-10-06 DIAGNOSIS — M109 Gout, unspecified: Secondary | ICD-10-CM

## 2015-10-06 DIAGNOSIS — G4700 Insomnia, unspecified: Secondary | ICD-10-CM | POA: Diagnosis not present

## 2015-10-06 DIAGNOSIS — R001 Bradycardia, unspecified: Secondary | ICD-10-CM

## 2015-10-06 DIAGNOSIS — I2510 Atherosclerotic heart disease of native coronary artery without angina pectoris: Secondary | ICD-10-CM | POA: Diagnosis not present

## 2015-10-06 DIAGNOSIS — H9193 Unspecified hearing loss, bilateral: Secondary | ICD-10-CM | POA: Diagnosis not present

## 2015-10-06 MED ORDER — MELATONIN ER 5 MG PO TBCR: 5.0000 mg | EXTENDED_RELEASE_TABLET | Freq: Every day | ORAL | 3 refills | Status: DC

## 2015-10-06 MED ORDER — RANITIDINE HCL 300 MG PO TABS: ORAL_TABLET | ORAL | 3 refills | Status: DC

## 2015-10-06 MED ORDER — ATENOLOL 25 MG PO TABS: ORAL_TABLET | ORAL | 3 refills | Status: DC

## 2015-10-06 NOTE — Progress Notes (Signed)
HPI:   Jennifer Green is a 75 y.o. female, who is here today to establish care with me.  Former PCP: Dr Sherren Mocha Last preventive routine visit: 12/2014  Concerns today: balance problems/dizziness and hearing loss. She also needs to follow on her chronic medical problems.  Hearing loss: C/O 15+ years of bilateral hearing loss, getting worse. She denies any chronic noise exposure or tinnitus.  + Dizziness, she has Hx of vertigo for 10-15 years, she was evaluated by ENT years ago, takes Meclizine as needed. Usually spinning sensation when lying in bed, turning head to left. No associated nausea. She also is c/o unbalance sensation, this is different to her vertigo and has been going on for about 3-4 years. It is mainly in the morning when she first gets up but can happen during the days intermittently. She denies any fall in the past year.  She has seen neurologists, referred because meningioma found incidentally, according to pt, she was reassure, it will take "100 years" to growth enough to cause problems.She denies any headache, visual changes, numbness, tingling, or focal weakness.  Hx of PVC's, followed with Dr Martinique, cardiologists, 06/2015 and does so annually.  October 2012 including echocardiogram and stress Myoview study were normal. She did have coronary calcification noted on chest CT in 2/13.   Hx of back pain and arthralgias, knees and ankles mainly.  Lumbar MRI 11/2013: 1. Severe left and mild right foraminal stenosis at L4-5 secondary to a broad-based disc protrusion and asymmetric left-sided facet hypertrophy. 2. Mild subarticular stenosis bilaterally at L4-5 and L5-S1. 3. Moderate foraminal stenosis bilaterally at L5-S1. 4. Moderate right and mild left subarticular and foraminal stenosis at L3-4. 5. Moderate right foraminal and subarticular stenosis at L2-3.  Hx of insomnia, she is on Trazodone 50 mg.According to pt, medication does not help.She still wakes  up a few times at night, takes her hours to follow asleep.She goes to bed around 10:30 pm and fallows asleep around 2-3 am. She has not tried OTC Melatonin before.   She takes Zantac 300 mg daily at bedtime for GERD, acid reflux causing laryngitis. Symptoms are well controlled with current treatment.She also tries to avoid food that can aggravate symptoms.  Denies abdominal pain, nausea, vomiting, changes in bowel habits, blood in stool or melena.   She follows with pulmonologists annually, initial evaluation due to chronic cough.  -Hx of bilateral open angle glaucoma.    Hypertension:  Currently on Atenolol 25 mg daily and Amlodipine 5 mg daily.    She is taking medications as instructed, no side effects reported.  She has not noted unusual headache, visual changes, exertional chest pain, dyspnea,  focal weakness, or edema.   Lab Results  Component Value Date   CREATININE 0.90 05/11/2015   BUN 20 05/11/2015   NA 139 05/11/2015   K 4.1 05/11/2015   CL 103 05/11/2015   CO2 27 05/11/2015    She also has Hx of gout, mainly affecting MTP joints and tarsus, R>L. She is on Allopurinol 100 mg daily, last gout attack about 3 months ago.     Review of Systems  Constitutional: Negative for activity change, appetite change, fatigue, fever and unexpected weight change.  HENT: Positive for hearing loss. Negative for congestion, ear discharge, ear pain, facial swelling, mouth sores, nosebleeds, sore throat, trouble swallowing and voice change.   Eyes: Negative for photophobia, pain and visual disturbance.  Respiratory: Negative for cough, shortness of breath and wheezing.  Cardiovascular: Negative for chest pain, palpitations and leg swelling.  Gastrointestinal: Positive for nausea (occasionally and associated with vertigo). Negative for abdominal pain and vomiting.       No changes in bowel habits.  Endocrine: Negative for cold intolerance, heat intolerance, polydipsia, polyphagia  and polyuria.  Genitourinary: Negative for decreased urine volume, difficulty urinating, dysuria and hematuria.  Musculoskeletal: Positive for arthralgias and back pain. Negative for gait problem and myalgias.  Skin: Negative for color change and rash.  Allergic/Immunologic: Negative for environmental allergies.  Neurological: Positive for dizziness. Negative for tremors, syncope, speech difficulty, weakness, numbness and headaches.  Hematological: Negative for adenopathy. Does not bruise/bleed easily.  Psychiatric/Behavioral: Positive for sleep disturbance. Negative for confusion. The patient is not nervous/anxious.       Current Outpatient Prescriptions on File Prior to Visit  Medication Sig Dispense Refill  . allopurinol (ZYLOPRIM) 300 MG tablet Take 1 tablet by mouth  daily 90 tablet 3  . amLODipine (NORVASC) 5 MG tablet Take 1 tablet (5 mg total) by mouth daily. 100 tablet 3  . apraclonidine (IOPIDINE) 0.5 % ophthalmic solution     . Calcium Carb-Cholecalciferol [phone] MG-UNIT CAPS Take by mouth.    . Calcium Carbonate-Vitamin D (CALCIUM + D PO) Take 1 tablet by mouth daily.     . dorzolamide-timolol (COSOPT) 22.3-6.8 MG/ML ophthalmic solution Place 1 drop into both eyes 2 (two) times daily.    [name] latanoprost (XALATAN) 0.005 % ophthalmic solution Place 1 drop into both eyes daily.     . Magnesium 400 MG CAPS Take 400 mg by mouth every other day.    . Multiple Vitamins-Iron (MULTIVITAMINS WITH IRON) TABS Take 1 tablet by mouth daily.      . Multiple Vitamins-Minerals (PRESERVISION AREDS 2 PO) Take by mouth.    . pilocarpine (PILOCAR) 4 % ophthalmic solution Place 1 drop into both eyes 2 (two) times daily.    . traZODone (DESYREL) 50 MG tablet Take 1 tablet by mouth at  bedtime as needed 90 tablet 3   No current facility-administered medications on file prior to visit.      Past Medical History:  Diagnosis Date  . Allergy   . Elevated blood sugar    790.21  . GERD  (gastroesophageal reflux disease)   . Glaucoma   . Gout   . Hypertension   . Hypokalemia   . Hypomagnesemia   . Insomnia   . Murmur   . OP (osteoporosis)   . PVC (premature ventricular contraction)    Allergies  Allergen Reactions  . Indomethacin     Rapid Heart Beat  . Promethazine Hcl Other (See Comments)    Couldn't stay still    Family History  Problem Relation Age of Onset  . Pneumonia Mother   . Hypertension Mother   . Heart disease Father     Social History   Social History  . Marital status: Married    Spouse name: N/A  . Number of children: 2  . Years of education: N/A   Occupational History  . retired     Optometrist   Social History Main Topics  . Smoking status: Never Smoker  . Smokeless tobacco: None  . Alcohol use No  . Drug use: No  . Sexual activity: No   Other Topics Concern  . None   Social History Narrative  . None    Vitals:   10/06/15 0854  BP: 120/80  Pulse: (!) 55  Resp: 12   O2  sat at RA 95% Body mass index is 25.61 kg/m.     Physical Exam  Nursing note and vitals reviewed. Constitutional: She is oriented to person, place, and time. She appears well-developed and well-nourished. She does not appear ill. No distress.  HENT:  Head: Atraumatic.  Right Ear: Tympanic membrane, external ear and ear canal normal. Decreased hearing is noted.  Left Ear: Tympanic membrane, external ear and ear canal normal. Decreased hearing is noted.  Mouth/Throat: Oropharynx is clear and moist and mucous membranes are normal.  Nystagmus neg. Excess cerumen bilateral, TMs seen partially.  Eyes: Conjunctivae are normal. Pupils are equal, round, and reactive to light.  Neck: No JVD present. Carotid bruit is not present. No tracheal deviation present. No thyromegaly present.  Cardiovascular: Regular rhythm.  Bradycardia present.   Murmur (?soft SEM RUSB) heard. Pulses:      Dorsalis pedis pulses are 2+ on the right side, and 2+ on the left  side.  Respiratory: Effort normal and breath sounds normal. No respiratory distress.  GI: Soft. She exhibits no mass. There is no hepatomegaly. There is no tenderness.  Musculoskeletal: She exhibits no edema or tenderness.  Lymphadenopathy:    She has no cervical adenopathy.  Neurological: She is alert and oriented to person, place, and time. She has normal strength. No cranial nerve deficit or sensory deficit. She displays a negative Romberg sign. Coordination and gait normal.  Pronator drift negative. Get up and go test 18 seconds, stable otherwise ,no assistance needed.  Skin: Skin is warm. No rash noted. No cyanosis or erythema.  Psychiatric: Her speech is normal. Her mood appears anxious. Cognition and memory are normal.  Well groomed, good eye contact.      ASSESSMENT AND PLAN:     Jennifer Green was seen today for transfer.  Diagnoses and all orders for this visit:  Essential hypertension  Adequately controlled. Because mild bradycardia, Atenolol decreased from 25 mg to 12.5 mg daily. Rest no changes. DASH/low salt diet recommended.  F/U in 2 months, before if needed.  -     atenolol (TENORMIN) 25 MG tablet; Take 1/2 tablet by mouth  daily  Hearing loss, bilateral  She has information about audiologists she would like to see, referral placed.   -     Ambulatory referral to Audiology  Unsteady gait  We discussed possible causes, it seems to be chronic. Fall precautions discussed. BP lying down 150/80, sitting up 130/60. Atenolol decreased. Zantac also decreased from 300 mg to 150 mg. Side effects of her current medications discussed. PT will be arranged. F/U in 2 months.  -     Ambulatory referral to Physical Therapy  Insomnia, unspecified  Trazodone to continue same dose for now. Melatonin ER added. Good sleep hygiene. F/U in 2 months.  -     Melatonin ER 5 MG TBCR; Take 5 mg by mouth at bedtime.  GOUT  Stable otherwise. No changes in current  management. U acid 03/2015 3.1  Gastroesophageal reflux disease without esophagitis  GERD precautions discussed. Try decreasing Zantac from 300 mg to 150 mg.  -     ranitidine (ZANTAC) 300 MG tablet; Take 1/2 tablet by mouth at  bedtime  Sinus bradycardia  Mild. Prior EKG's HR 50's, 12/2014 also PVC's. Instructed about warning signs. Atenolol decreased. F/U in 2 months.           Remington Skalsky G. Martinique, MD  [facility]. [place] office.

## 2015-10-06 NOTE — Patient Instructions (Signed)
A few things to remember from today's visit:   Essential hypertension - Plan: atenolol (TENORMIN) 25 MG tablet  Hearing loss, bilateral - Plan: Ambulatory referral to Audiology  Unsteady gait - Plan: Ambulatory referral to Physical Therapy  Insomnia, unspecified - Plan: Melatonin ER 5 MG TBCR  GOUT  Gastroesophageal reflux disease without esophagitis - Plan: ranitidine (ZANTAC) 300 MG tablet  Sinus bradycardia  Atenolol and Zantac decreased to half tablet. Continue rest of medications. Melatonin at bedtime added. Monitor blood pressure and pulse at home    Avoid foods that make your symptoms worse, for example coffee, chocolate,pepermeint,alcohol, and greasy food. Raising the head of your bed about 6 inches may help with nocturnal symptoms.  Avoid tobacco use. Weight loss (if you are overweight). Avoid lying down for 3 hours after eating.  Instead 3 large meals daily try small and more frequent meals during the day.  Some medications we recommend for acid reflux treatment (proton pump inhibitors) can cause some problems in the long term: increase risk of osteoporosis, vitamin deficiencies,pneumonia, and more recently discovered that it can increase the risk of chronic kidney disease and might increase risk of dementia.  A few tips:  -As we age balance is not as good as it was, so there is a higher risks for falls. Please remove small rugs and furniture that is "in your way" and could increase the risk of falls. Stretching exercises may help with fall prevention: Yoga and Tai Chi are some examples. Low impact exercise is better, so you are not very achy the next day.   - Some medications are not safe as we age, increases the risk of side effects and can potentially interact with other medication you are also taken;  including some of over the counter medications. Be sure to let me know when you start a new medication even if it is a dietary/vitamin supplement. Today some  medications were decreased to see if that helps with unbalance.   -Healthy diet low in red meet/animal fat and sugar + regular physical activity is recommended.        Please be sure medication list is accurate. If a new problem present, please set up appointment sooner than planned today.         

## 2015-10-06 NOTE — Progress Notes (Signed)
Pre visit review using our clinic review tool, if applicable. No additional management support is needed unless otherwise documented below in the visit note. 

## 2015-10-10 ENCOUNTER — Encounter: Payer: Self-pay | Admitting: Physical Therapy

## 2015-10-10 ENCOUNTER — Ambulatory Visit: Payer: Medicare Other | Attending: Family Medicine | Admitting: Physical Therapy

## 2015-10-10 DIAGNOSIS — R2689 Other abnormalities of gait and mobility: Secondary | ICD-10-CM | POA: Insufficient documentation

## 2015-10-10 DIAGNOSIS — M6281 Muscle weakness (generalized): Secondary | ICD-10-CM

## 2015-10-10 NOTE — Therapy (Signed)
[facility] [address] [place], [place], [address] Phone: [phone]   Fax:  [phone]  Physical Therapy Evaluation  Patient Details  Name: Jennifer Green MRN: [id number] Date of Birth: October 13, 1940 Referring Provider: Dr. Merceda Elks  Encounter Date: 10/10/2015      PT End of Session - 10/10/15 1250    Visit Number 1   Number of Visits 10   Date for PT Re-Evaluation 12/05/15   Authorization Type Medicare g-code 10th visit   PT Start Time 1100   PT Stop Time 1145   PT Time Calculation (min) 45 min   Activity Tolerance Patient tolerated treatment well   Behavior During Therapy [facility] for tasks assessed/performed      Past Medical History:  Diagnosis Date  . Allergy   . Elevated blood sugar    790.21  . GERD (gastroesophageal reflux disease)   . Glaucoma   . Gout   . Hypertension   . Hypokalemia   . Hypomagnesemia   . Insomnia   . Murmur   . OP (osteoporosis)   . PVC (premature ventricular contraction)     Past Surgical History:  Procedure Laterality Date  . CERVICAL FUSION  05/2010  . EYE SURGERY     right and left    There were no vitals filed for this visit.       Subjective Assessment - 10/10/15 1151    Subjective Patient broke right foot she has not been able to balance.  Unable to walk far and wants to lunge forward.  Walks side to side. When she looks up and will go backwards or forward.     Limitations Walking   Patient Stated Goals Patient wants to walk like she used to    Currently in Pain? Yes   Pain Score 6    Pain Location Foot   Pain Orientation Right   Pain Descriptors / Indicators Dull   Pain Type Chronic pain   Pain Onset More than a month ago   Pain Frequency Intermittent   Aggravating Factors  walk more than 1/2 mile   Pain Relieving Factors not walking            [facility] PT Assessment - 10/10/15 0001      Assessment   Medical Diagnosis R26.81 Unsteady gait   Referring Provider Dr. Merceda Elks   Onset Date/Surgical Date 04/11/15   Prior Therapy on right ankle     Precautions   Precautions Other (comment)   Precaution Comments osteoporosis     Restrictions   Weight Bearing Restrictions No     Balance Screen   Has the patient fallen in the past 6 months No   Has the patient had a decrease in activity level because of a fear of falling?  No   Is the patient reluctant to leave their home because of a fear of falling?  No     Home Ecologist residence     Prior Function   Level of [place] Retired     Associate Professor   Overall Cognitive Status Within Functional Limits for tasks assessed     ROM / Strength   AROM / PROM / Strength AROM;Strength     Strength   Right Hip Flexion 4/5   Right Hip Extension 4/5   Right Hip ABduction 4-/5   Left Hip Extension 4/5   Right Knee Flexion 4-/5   Right Knee  Extension 4-/5   Right Ankle Plantar Flexion 3+/5   Left Ankle Plantar Flexion 3+/5     Ambulation/Gait   Ambulation/Gait Yes   Assistive device None   Gait Pattern Decreased dorsiflexion - right;Abducted- right;Poor foot clearance - right  right foot whips out with heel strike   Ambulation Surface Level   Stairs Yes   Stairs Assistance 7: Independent   Stair Management Technique --  unsteady     Standardized Balance Assessment   Five times sit to stand comments  13 sec     Dynamic Gait Index   Level Surface Moderate Impairment   Change in Gait Speed Moderate Impairment  walks with decreased step length and on toes   Gait with Horizontal Head Turns Mild Impairment  stumbled on right foot   Gait with Vertical Head Turns Moderate Impairment  stumbles with head turns   Gait and Pivot Turn Mild Impairment   Step Over Obstacle Normal   Step Around Obstacles Normal   Steps Mild Impairment   Total Score 15   DGI comment: indicates chance of falling     High Level Balance    High Level Balance Comments stand on one foot 3 sec; tandem stand ; unable to tandem stance     Functional Gait  Assessment   Gait assessed  Yes                           PT Education - 10/10/15 1249    Education provided Yes   Education Details tips to avoid falls, heel raises, single leg stance   Person(s) Educated Patient   Methods Explanation;Demonstration;Verbal cues;Handout   Comprehension Returned demonstration;Verbalized understanding          PT Short Term Goals - 10/10/15 1243      PT SHORT TERM GOAL #1   Title independent with initial HEP   Time 4   Period Weeks   Status New     PT SHORT TERM GOAL #2   Title Dynamic gait index improved </= 20 points   Time 4   Period Weeks   Status New     PT SHORT TERM GOAL #3   Title pain in right ankle and foot decreased >/= 25%   Time 4   Period Weeks   Status New           PT Long Term Goals - 10/10/15 1202      PT LONG TERM GOAL #1   Title independent with HEP   Time 8   Period Weeks   Status New     PT LONG TERM GOAL #2   Title walk for 1 mile feeling steady due to right leg strength >/= 4+/5 and no whipping of right forefoot during stance phase of gait   Time 8   Period Weeks   Status New     PT LONG TERM GOAL #3   Title going down steps without difficulty with step over step pattern due to right knee extension strength 5/5   Time 8   Period Weeks   Status New     PT LONG TERM GOAL #4   Title Dynamic Gait assessment >/= 22 points due to increased balance   Time 8   Period Weeks   Status New     PT LONG TERM GOAL #5   Title stand on one foot for 5-10 seconds to improve stance phase of gait   Time  8   Period Weeks   Status New               Plan - 10/10/15 1251    Clinical Impression Statement Patient is a 75 year old female who has had trouble with walking since she broke her right ankle 2 years ago.  Patient reports pain in right foot and peroneals at level 6/10  with muscle spasms with walking.  Patient ambulates with decresaed plantarflexion, whip of forefoot with heel strike.  Patient has difficulty with going down steps due to being unsteady and weakness in right leg.  Right knee strength is 4-/5, right planrtaflexion is 3+/5, right hip abduction, flexion and extension is 4/5. Patient has difficulty with walking with changing speeds, moving her head. Patient is of low complexity due to no comorbidities that will affect her progress.  Patient will benefit from skilled therapy to improve strength and gait to decrease risk of falling.    Rehab Potential Excellent   Clinical Impairments Affecting Rehab Potential None   PT Frequency 2x / week   PT Duration 8 weeks   PT Treatment/Interventions Cryotherapy;Electrical Stimulation;Iontophoresis 4mg /ml Dexamethasone;Stair training;Gait training;Ultrasound;Moist Heat;Therapeutic activities;Therapeutic exercise;Balance training;Neuromuscular re-education;Patient/family education;Passive range of motion;Manual techniques;Dry needling;Energy conservation   PT Next Visit Plan right hip, knee and plantarflexion strength, work on walking with head movements, walk with no whip of right foot, balance exercises, stair climbing downward, modalities as needed   PT Home Exercise Plan progress as needed   Recommended Other Services None   Consulted and Agree with Plan of Care Patient      Patient will benefit from skilled therapeutic intervention in order to improve the following deficits and impairments:  Abnormal gait, Decreased coordination, Difficulty walking, Increased muscle spasms, Pain, Decreased activity tolerance, Decreased balance, Decreased strength  Visit Diagnosis: Muscle weakness (generalized) - Plan: PT plan of care cert/re-cert  Other abnormalities of gait and mobility - Plan: PT plan of care cert/re-cert      G-Codes - [id number]    Functional Assessment Tool Used therapist discretion for Dynamic gait  index is 30% limitation  goal is 20% limitation   Functional Limitation Mobility: Walking and moving around   Mobility: Walking and Moving Around Current Status [id number]) At least 20 percent but less than 40 percent impaired, limited or restricted   Mobility: Walking and Moving Around Goal Status [phone]) At least 20 percent but less than 40 percent impaired, limited or restricted       Problem List Patient Active Problem List   Diagnosis Date Noted  . Sinus bradycardia 10/06/2015  . 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 Dyspnea and respiratory abnormality 02/25/2014  . Ataxia 08/12/2013  . Peripheral edema 08/12/2013  . Meningioma ([place]) 04/29/2013  . Hearing loss 03/23/2013  . Hemoptysis 02/23/2013  . PVC's (premature ventricular contractions) 07/15/2011  . Coronary artery calcification seen on CAT scan 03/28/2011  . Lung nodule 12/31/2010  . Chronic cough 12/31/2010  . Hypokalemia 12/05/2010  . Irregular heart rate 12/05/2010  . Insomnia, unspecified 02/16/2009  . CHEST WALL PAIN, ACUTE 12/30/2008  . [place] DISEASE, CERVICAL 03/24/2007  . GOUT 07/11/2006  . Essential hypertension 07/11/2006  . ALLERGIC RHINITIS 07/11/2006  . GERD 07/11/2006    Earlie Counts, PT 10/10/15 1:11 PM   [facility] [address] [place], [place], [address] Phone: [phone]   Fax:  [phone]  Name: Jennifer Green MRN: [id number] Date of Birth: Dec 26, 1940

## 2015-10-10 NOTE — Patient Instructions (Addendum)
With Support    Stand on one leg in neutral spine holding support. Hold _10___ seconds. Repeat on other leg. Do __5_ repetitions, __1__ sets.  http://bt.exer.us/34   Copyright  VHI. All rights reserved.  Heel Raises    Stand with support. Tighten pelvic floor and hold. With knees straight, raise heels off ground. Hold _3__ seconds. Relax for _3__ seconds. Repeat _30__ times. Do _1__ times a day. Going slowly Copyright  VHI. All rights reserved.  Fall Prevention and Home Safety Falls cause injuries and can affect all age groups. It is possible to use preventive measures to significantly decrease the likelihood of falls. There are many simple measures which can make your home safer and prevent falls. OUTDOORS  Repair cracks and edges of walkways and driveways.  Remove high doorway thresholds.  Trim shrubbery on the main path into your home.  Have good outside lighting.  Clear walkways of tools, rocks, debris, and clutter.  Check that handrails are not broken and are securely fastened. Both sides of steps should have handrails.  Have leaves, snow, and ice cleared regularly.  Use sand or salt on walkways during winter months.  In the garage, clean up grease or oil spills. BATHROOM  Install night lights.  Install grab bars by the toilet and in the tub and shower.  Use non-skid mats or decals in the tub or shower.  Place a plastic non-slip stool in the shower to sit on, if needed.  Keep floors dry and clean up all water on the floor immediately.  Remove soap buildup in the tub or shower on a regular basis.  Secure bath mats with non-slip, double-sided rug tape.  Remove throw rugs and tripping hazards from the floors. BEDROOMS  Install night lights.  Make sure a bedside light is easy to reach.  Do not use oversized bedding.  Keep a telephone by your bedside.  Have a firm chair with side arms to use for getting dressed.  Remove throw rugs and tripping  hazards from the floor. KITCHEN  Keep handles on pots and pans turned toward the center of the stove. Use back burners when possible.  Clean up spills quickly and allow time for drying.  Avoid walking on wet floors.  Avoid hot utensils and knives.  Position shelves so they are not too high or low.  Place commonly used objects within easy reach.  If necessary, use a sturdy step stool with a grab bar when reaching.  Keep electrical cables out of the way.  Do not use floor polish or wax that makes floors slippery. If you must use wax, use non-skid floor wax.  Remove throw rugs and tripping hazards from the floor. STAIRWAYS  Never leave objects on stairs.  Place handrails on both sides of stairways and use them. Fix any loose handrails. Make sure handrails on both sides of the stairways are as long as the stairs.  Check carpeting to make sure it is firmly attached along stairs. Make repairs to worn or loose carpet promptly.  Avoid placing throw rugs at the top or bottom of stairways, or properly secure the rug with carpet tape to prevent slippage. Get rid of throw rugs, if possible.  Have an electrician put in a light switch at the top and bottom of the stairs. OTHER FALL PREVENTION TIPS  Wear low-heel or rubber-soled shoes that are supportive and fit well. Wear closed toe shoes.  When using a stepladder, make sure it is fully opened and both spreaders  are firmly locked. Do not climb a closed stepladder.  Add color or contrast paint or tape to grab bars and handrails in your home. Place contrasting color strips on first and last steps.  Learn and use mobility aids as needed. Install an electrical emergency response system.  Turn on lights to avoid dark areas. Replace light bulbs that burn out immediately. Get light switches that glow.  Arrange furniture to create clear pathways. Keep furniture in the same place.  Firmly attach carpet with non-skid or double-sided  tape.  Eliminate uneven floor surfaces.  Select a carpet pattern that does not visually hide the edge of steps.  Be aware of all pets. OTHER HOME SAFETY TIPS  Set the water temperature for 120 F (48.8 C).  Keep emergency numbers on or near the telephone.  Keep smoke detectors on every level of the home and near sleeping areas. Document Released: 01/18/2002 Document Revised: 07/30/2011 Document Reviewed: 04/19/2011 [facility] Patient Information 2015 [place], [place]. This information is not intended to replace advice given to you by your health care provider. Make sure you discuss any questions you have with your health care provider. [facility] [address] [place], [place] [address] Phone # [phone] Fax [phone]

## 2015-10-12 ENCOUNTER — Ambulatory Visit: Payer: Medicare Other | Admitting: Physical Therapy

## 2015-10-12 DIAGNOSIS — M6281 Muscle weakness (generalized): Secondary | ICD-10-CM | POA: Diagnosis not present

## 2015-10-12 DIAGNOSIS — R2689 Other abnormalities of gait and mobility: Secondary | ICD-10-CM

## 2015-10-12 NOTE — Therapy (Signed)
[facility] [address], [place], [place], [address] Phone: [phone]   Fax:  [phone]  Physical Therapy Treatment  Patient Details  Name: Jennifer Green MRN: [id number] Date of Birth: 09/04/1940 Referring Provider: Dr. Merceda Elks  Encounter Date: 10/12/2015      PT End of Session - 10/12/15 1838    Visit Number 2   Number of Visits 10   Date for PT Re-Evaluation 12/05/15   Authorization Type Medicare g-code 10th visit   PT Start Time 0840   PT Stop Time 0925   PT Time Calculation (min) 45 min   Activity Tolerance Patient tolerated treatment well      Past Medical History:  Diagnosis Date  . Allergy   . Elevated blood sugar    790.21  . GERD (gastroesophageal reflux disease)   . Glaucoma   . Gout   . Hypertension   . Hypokalemia   . Hypomagnesemia   . Insomnia   . Murmur   . OP (osteoporosis)   . PVC (premature ventricular contraction)     Past Surgical History:  Procedure Laterality Date  . CERVICAL FUSION  05/2010  . EYE SURGERY     right and left    There were no vitals filed for this visit.      Subjective Assessment - 10/12/15 0842    Subjective Minor ankle soreness in ankle today after walking 3/4 mile yesterday at a faster speed than she's used to.  My balance was OK.     Currently in Pain? Yes   Pain Score 5    Pain Location Ankle   Pain Orientation Right   Pain Type Chronic pain                         [facility] Adult PT Treatment/Exercise - 10/12/15 0001      Knee/Hip Exercises: Standing   Forward Step Up Right;Left;1 set;10 reps;Hand Hold: 0;Step Height: 6"   Gait Training step taps 8x right and left   Other Standing Knee Exercises weight shifting 4 ways   Other Standing Knee Exercises wall pull aways 10x     Ankle Exercises: Stretches   Other Stretch psoas doorway stretch with UE movements right/left 3x5     Ankle Exercises: Aerobic   Stationary  Bike Nu-Step L1 6 min     Ankle Exercises: Standing   Rebounder 3 way weight shifts 1 min each   Other Standing Ankle Exercises high step ladder walk, side stepping thru ladder, stepping in/out   Other Standing Ankle Exercises stepping over and around cones, walking with head movements                  PT Short Term Goals - 10/12/15 1843      PT SHORT TERM GOAL #1   Title independent with initial HEP   Time 4   Period Weeks   Status On-going     PT SHORT TERM GOAL #2   Title Dynamic gait index improved </= 20 points   Time 4   Period Weeks   Status On-going     PT SHORT TERM GOAL #3   Title pain in right ankle and foot decreased >/= 25%   Time 4   Period Weeks   Status On-going           PT Long Term Goals - 10/12/15 1843      PT LONG TERM GOAL #  1   Title independent with HEP   Time 8   Period Weeks   Status On-going     PT LONG TERM GOAL #2   Title walk for 1 mile feeling steady due to right leg strength >/= 4+/5 and no whipping of right forefoot during stance phase of gait   Time 8   Period Weeks   Status On-going     PT LONG TERM GOAL #3   Title going down steps without difficulty with step over step pattern due to right knee extension strength 5/5   Time 8   Period Weeks   Status On-going     PT LONG TERM GOAL #4   Title Dynamic Gait assessment >/= 22 points due to increased balance   Time 8   Period Weeks   Status On-going     PT LONG TERM GOAL #5   Title stand on one foot for 5-10 seconds to improve stance phase of gait   Time 8   Period Weeks   Status On-going               Plan - 10/12/15 1839    Clinical Impression Statement The patient has decreased ankle ROM which impairs her balance.  She is able to participate in ankle ROM and proprioceptive exercises without exacerbation of pain.  She is also able to perform low to intermediate challenges with dynamic gait with contact guard assist.  Therapist closely monitoring  response and providing assist for safety.     PT Next Visit Plan right hip, knee and plantarflexion strength, work on walking with head movements, walk with no whip of right foot, balance exercises, stair climbing downward, modalities as needed      Patient will benefit from skilled therapeutic intervention in order to improve the following deficits and impairments:     Visit Diagnosis: Muscle weakness (generalized)  Other abnormalities of gait and mobility     Problem List Patient Active Problem List   Diagnosis Date Noted  . Sinus bradycardia 10/06/2015  . 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 Dyspnea and respiratory abnormality 02/25/2014  . Ataxia 08/12/2013  . Peripheral edema 08/12/2013  . Meningioma ([place]) 04/29/2013  . Hearing loss 03/23/2013  . Hemoptysis 02/23/2013  . PVC's (premature ventricular contractions) 07/15/2011  . Coronary artery calcification seen on CAT scan 03/28/2011  . Lung nodule 12/31/2010  . Chronic cough 12/31/2010  . Hypokalemia 12/05/2010  . Irregular heart rate 12/05/2010  . Insomnia, unspecified 02/16/2009  . CHEST WALL PAIN, ACUTE 12/30/2008  . [place] DISEASE, CERVICAL 03/24/2007  . GOUT 07/11/2006  . Essential hypertension 07/11/2006  . ALLERGIC RHINITIS 07/11/2006  . GERD 07/11/2006   Ruben Im, PT 10/12/15 6:44 PM Phone: [phone] Fax: [phone]  Alvera Singh 10/12/2015, 6:44 PM  [facility] [address], [place], [place], [address] Phone: [phone]   Fax:  [phone]  Name: Jennifer Green MRN: [id number] Date of Birth: 05-28-1940

## 2015-10-17 ENCOUNTER — Encounter: Payer: Self-pay | Admitting: Physical Therapy

## 2015-10-17 ENCOUNTER — Ambulatory Visit: Payer: Medicare Other | Attending: Family Medicine | Admitting: Physical Therapy

## 2015-10-17 DIAGNOSIS — M6281 Muscle weakness (generalized): Secondary | ICD-10-CM | POA: Diagnosis present

## 2015-10-17 DIAGNOSIS — R2689 Other abnormalities of gait and mobility: Secondary | ICD-10-CM | POA: Diagnosis present

## 2015-10-17 NOTE — Patient Instructions (Addendum)
Heel Raise: Unilateral (Standing)    Balance on left foot, then rise on ball of foot. Repeat _15___ times per set. Do __2__ sets per session. Do _1___ sessions per day. Hold onto counter.  http://orth.exer.us/40   Copyright  VHI. All rights reserved.  Walk at counter.  Right side is against counter. Place counter.  Walk heel toe along the counter for 4 times.  Prevent whipping of right foot and sliding foot on ground.   Step-Up: Forward    Leading with right leg, bring both feet onto _6___ inch step. Return to starting position, leading with right leg. Repeat _30___ times per session. Do _1___ sessions per day. Use cane until you feel confident that you do not need it.  Copyright  VHI. All rights reserved.  Achilles / Gastroc, Standing    Stand, right foot behind, heel on floor and turned slightly out, leg straight, forward leg bent. Move hips forward. Hold 30___ seconds. Repeat _2__ times per session. Do _1__ sessions per day.  Copyright  VHI. All rights reserved.  [facility] [address], [place], [place] [address] Phone # [phone] Fax [phone]

## 2015-10-17 NOTE — Therapy (Signed)
[facility] [address], [place], [place], [address] Phone: [phone]   Fax:  [phone]  Physical Therapy Treatment  Patient Details  Name: Jennifer Green MRN: [id number] Date of Birth: 1940/07/20 Referring Provider: Dr. Merceda Elks  Encounter Date: 10/17/2015      PT End of Session - 10/17/15 1230    Visit Number 3   Number of Visits 10   Date for PT Re-Evaluation 12/05/15   Authorization Type Medicare g-code 10th visit   PT Start Time 1230   PT Stop Time 1310   PT Time Calculation (min) 40 min   Activity Tolerance Patient tolerated treatment well   Behavior During Therapy [facility] for tasks assessed/performed      Past Medical History:  Diagnosis Date  . Allergy   . Elevated blood sugar    790.21  . GERD (gastroesophageal reflux disease)   . Glaucoma   . Gout   . Hypertension   . Hypokalemia   . Hypomagnesemia   . Insomnia   . Murmur   . OP (osteoporosis)   . PVC (premature ventricular contraction)     Past Surgical History:  Procedure Laterality Date  . CERVICAL FUSION  05/2010  . EYE SURGERY     right and left    There were no vitals filed for this visit.      Subjective Assessment - 10/17/15 1235    Subjective I can walk a little faster. I am doing good with my home program.    Limitations Walking   Patient Stated Goals Patient wants to walk like she used to    Currently in Pain? Yes   Pain Score 4    Pain Location Ankle   Pain Orientation Right   Pain Descriptors / Indicators Dull;Sore   Pain Type Chronic pain   Pain Onset More than a month ago   Pain Frequency Intermittent   Aggravating Factors  walk more than 1/2 mile   Pain Relieving Factors not walking   Multiple Pain Sites No                         [facility] Adult PT Treatment/Exercise - 10/17/15 0001      Knee/Hip Exercises: Machines for Strengthening   Cybex Knee Extension 5# 15x2   Cybex Knee Flexion  20# 15x2   Cybex Leg Press seat #3 50# 2 legs 10x2     Ankle Exercises: Aerobic   Stationary Bike Nu-Step L1 6 min     Ankle Exercises: Standing   Rocker Board 1 minute  only fingertips   Rebounder 3 way weight shifts 1 min each   Other Standing Ankle Exercises high step throught the ladder 6 times with difficulty and C.G.                PT Education - 10/17/15 1252    Education provided Yes   Education Details ankle stretch, walking with foot straight, heel raises, step ups   Person(s) Educated Patient   Methods Explanation;Demonstration   Comprehension Verbalized understanding;Returned demonstration          PT Short Term Goals - 10/17/15 1237      PT SHORT TERM GOAL #1   Title independent with initial HEP   Time 4   Period Weeks   Status Achieved     PT SHORT TERM GOAL #2   Title Dynamic gait index improved </= 20 points   Time  4   Period Weeks   Status On-going     PT SHORT TERM GOAL #3   Title pain in right ankle and foot decreased >/= 25%   Time 4   Period Weeks   Status On-going  sore with walking           PT Long Term Goals - 10/12/15 1843      PT LONG TERM GOAL #1   Title independent with HEP   Time 8   Period Weeks   Status On-going     PT LONG TERM GOAL #2   Title walk for 1 mile feeling steady due to right leg strength >/= 4+/5 and no whipping of right forefoot during stance phase of gait   Time 8   Period Weeks   Status On-going     PT LONG TERM GOAL #3   Title going down steps without difficulty with step over step pattern due to right knee extension strength 5/5   Time 8   Period Weeks   Status On-going     PT LONG TERM GOAL #4   Title Dynamic Gait assessment >/= 22 points due to increased balance   Time 8   Period Weeks   Status On-going     PT LONG TERM GOAL #5   Title stand on one foot for 5-10 seconds to improve stance phase of gait   Time 8   Period Weeks   Status On-going               Plan -  10/17/15 1307    Clinical Impression Statement Patient still walks with a whip on right foot.  When she works on [employer] she is able to ambulate without deficits. Patient has soreness in right ankle. Patient has weakness in right lower leg and ankle.  Patient has not met goals due to just starting therapy. Patient will benefit from physical therapy to  improve strength and balance.    Rehab Potential Excellent   Clinical Impairments Affecting Rehab Potential None   PT Frequency 2x / week   PT Duration 8 weeks   PT Treatment/Interventions Cryotherapy;Electrical Stimulation;Iontophoresis 81m/ml Dexamethasone;Stair training;Gait training;Ultrasound;Moist Heat;Therapeutic activities;Therapeutic exercise;Balance training;Neuromuscular re-education;Patient/family education;Passive range of motion;Manual techniques;Dry needling;Energy conservation   PT Next Visit Plan right hip, knee and plantarflexion strength, work on walking with head movements, walk with no whip of right foot, balance exercises, stair climbing downward, modalities as needed   PT Home Exercise Plan progress as needed   Consulted and Agree with Plan of Care Patient      Patient will benefit from skilled therapeutic intervention in order to improve the following deficits and impairments:  Abnormal gait, Decreased coordination, Difficulty walking, Increased muscle spasms, Pain, Decreased activity tolerance, Decreased balance, Decreased strength  Visit Diagnosis: Muscle weakness (generalized)  Other abnormalities of gait and mobility     Problem List Patient Active Problem List   Diagnosis Date Noted  . Sinus bradycardia 10/06/2015  . 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 Dyspnea and respiratory abnormality 02/25/2014  . Ataxia 08/12/2013  . Peripheral edema 08/12/2013  . [place]eningioma ([place] 04/29/2013  . Hearing loss 03/23/2013  . Hemoptysis 02/23/2013  . PVC's (premature ventricular contractions) 07/15/2011   . Coronary artery calcification seen on CAT scan 03/28/2011  . Lung nodule 12/31/2010  . Chronic cough 12/31/2010  . Hypokalemia 12/05/2010  . Irregular heart rate 12/05/2010  . Insomnia, unspecified 02/16/2009  . CHEST WALL PAIN, ACUTE 12/30/2008  . [place]  DISEASE, CERVICAL 03/24/2007  . GOUT 07/11/2006  . Essential hypertension 07/11/2006  . ALLERGIC RHINITIS 07/11/2006  . GERD 07/11/2006    Earlie Counts, PT 10/17/15 1:14 PM    [facility] [address], [place], [place], [address] Phone: [phone]   Fax:  [phone]  Name: Jennifer Green MRN: [id number] Date of Birth: Dec 27, 1940

## 2015-10-19 ENCOUNTER — Encounter: Payer: Self-pay | Admitting: Physical Therapy

## 2015-10-19 ENCOUNTER — Ambulatory Visit: Payer: Medicare Other | Admitting: Physical Therapy

## 2015-10-19 DIAGNOSIS — M6281 Muscle weakness (generalized): Secondary | ICD-10-CM

## 2015-10-19 DIAGNOSIS — R2689 Other abnormalities of gait and mobility: Secondary | ICD-10-CM

## 2015-10-19 NOTE — Therapy (Signed)
[facility] [address], [place], [place], [address] Phone: [phone]   Fax:  [phone]  Physical Therapy Treatment  Patient Details  Name: Jennifer Green MRN: [id number] Date of Birth: [birth year] Referring Provider: Dr. Merceda Elks  Encounter Date: 10/19/2015      PT End of Session - 10/19/15 1110    Visit Number 4   Number of Visits 10   Date for PT Re-Evaluation 12/05/15   Authorization Type Medicare g-code 10th visit   PT Start Time 1107   PT Stop Time 1145   PT Time Calculation (min) 38 min   Activity Tolerance Patient tolerated treatment well   Behavior During Therapy [facility] for tasks assessed/performed      Past Medical History:  Diagnosis Date  . Allergy   . Elevated blood sugar    790.21  . GERD (gastroesophageal reflux disease)   . Glaucoma   . Gout   . Hypertension   . Hypokalemia   . Hypomagnesemia   . Insomnia   . Murmur   . OP (osteoporosis)   . PVC (premature ventricular contraction)     Past Surgical History:  Procedure Laterality Date  . CERVICAL FUSION  05/2010  . EYE SURGERY     right and left    There were no vitals filed for this visit.      Subjective Assessment - 10/19/15 1109    Subjective Pt reports ankle feeling ok today. Did not walk last night.    Limitations Walking   Patient Stated Goals Patient wants to walk like she used to    Currently in Pain? Yes   Pain Score 2    Pain Location Ankle   Pain Orientation Right   Pain Descriptors / Indicators Dull;Sore   Pain Type Chronic pain   Pain Onset More than a month ago   Pain Frequency Intermittent   Multiple Pain Sites No                         [facility] Adult PT Treatment/Exercise - 10/19/15 0001      Knee/Hip Exercises: Machines for Strengthening   Cybex Leg Press seat #3 50# 2 legs 10x2     Knee/Hip Exercises: Standing   Heel Raises 2 sets;10 reps;Both   Hip Flexion AROM;Stengthening;2  sets;10 reps   Hip Abduction AROM;Stengthening;Both;2 sets;10 reps   Hip Extension AROM;Stengthening;Both;2 sets;10 reps   Other Standing Knee Exercises Resisted walking  #5     Ankle Exercises: Aerobic   Stationary Bike Nu-Step L1 6 min     Ankle Exercises: Standing   Rebounder 3 way weight shifts 1 min each   Other Standing Ankle Exercises high step throught the ladder 6 times with difficulty and C.G.                  PT Short Term Goals - 10/17/15 1237      PT SHORT TERM GOAL #1   Title independent with initial HEP   Time 4   Period Weeks   Status Achieved     PT SHORT TERM GOAL #2   Title Dynamic gait index improved </= 20 points   Time 4   Period Weeks   Status On-going     PT SHORT TERM GOAL #3   Title pain in right ankle and foot decreased >/= 25%   Time 4   Period Weeks   Status On-going  sore with  walking           PT Long Term Goals - 10/12/15 1843      PT LONG TERM GOAL #1   Title independent with HEP   Time 8   Period Weeks   Status On-going     PT LONG TERM GOAL #2   Title walk for 1 mile feeling steady due to right leg strength >/= 4+/5 and no whipping of right forefoot during stance phase of gait   Time 8   Period Weeks   Status On-going     PT LONG TERM GOAL #3   Title going down steps without difficulty with step over step pattern due to right knee extension strength 5/5   Time 8   Period Weeks   Status On-going     PT LONG TERM GOAL #4   Title Dynamic Gait assessment >/= 22 points due to increased balance   Time 8   Period Weeks   Status On-going     PT LONG TERM GOAL #5   Title stand on one foot for 5-10 seconds to improve stance phase of gait   Time 8   Period Weeks   Status On-going               Plan - 10/19/15 1153    Clinical Impression Statement Pt able to tolerate all therex well. Some weakness in Rt low extermity. Some difficulty with ladder exercise with balance and proper gait pattern. Will  continue to strengthen Rt LE and improve gait.    Rehab Potential Excellent   Clinical Impairments Affecting Rehab Potential None   PT Frequency 2x / week   PT Duration 8 weeks   PT Treatment/Interventions Cryotherapy;Electrical Stimulation;Iontophoresis 4mg /ml Dexamethasone;Stair training;Gait training;Ultrasound;Moist Heat;Therapeutic activities;Therapeutic exercise;Balance training;Neuromuscular re-education;Patient/family education;Passive range of motion;Manual techniques;Dry needling;Energy conservation   PT Next Visit Plan right hip, knee and plantarflexion strength, work on walking with head movements, walk with no whip of right foot, balance exercises, stair climbing downward, modalities as needed   PT Home Exercise Plan progress as needed   Consulted and Agree with Plan of Care Patient      Patient will benefit from skilled therapeutic intervention in order to improve the following deficits and impairments:  Abnormal gait, Decreased coordination, Difficulty walking, Increased muscle spasms, Pain, Decreased activity tolerance, Decreased balance, Decreased strength  Visit Diagnosis: Muscle weakness (generalized)  Other abnormalities of gait and mobility     Problem List Patient Active Problem List   Diagnosis Date Noted  . Sinus bradycardia 10/06/2015  . 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 Dyspnea and respiratory abnormality 02/25/2014  . Ataxia 08/12/2013  . Peripheral edema 08/12/2013  . Meningioma ([place]) 04/29/2013  . Hearing loss 03/23/2013  . Hemoptysis 02/23/2013  . PVC's (premature ventricular contractions) 07/15/2011  . Coronary artery calcification seen on CAT scan 03/28/2011  . Lung nodule 12/31/2010  . Chronic cough 12/31/2010  . Hypokalemia 12/05/2010  . Irregular heart rate 12/05/2010  . Insomnia, unspecified 02/16/2009  . CHEST WALL PAIN, ACUTE 12/30/2008  . [place] DISEASE, CERVICAL 03/24/2007  . GOUT 07/11/2006  . Essential hypertension  07/11/2006  . ALLERGIC RHINITIS 07/11/2006  . GERD 07/11/2006    Mikle Bosworth PTA 10/19/2015, 12:06 PM  [facility] [address] [place], [place], [address] Phone: [phone]   Fax:  [phone]  Name: Jennifer Green MRN: [id number] Date of Birth: 03/08/1940

## 2015-10-24 ENCOUNTER — Encounter: Payer: Self-pay | Admitting: Physical Therapy

## 2015-10-24 ENCOUNTER — Ambulatory Visit: Payer: Medicare Other | Admitting: Physical Therapy

## 2015-10-24 DIAGNOSIS — M6281 Muscle weakness (generalized): Secondary | ICD-10-CM | POA: Diagnosis not present

## 2015-10-24 DIAGNOSIS — R2689 Other abnormalities of gait and mobility: Secondary | ICD-10-CM

## 2015-10-24 NOTE — Therapy (Signed)
[facility] [address] [place], [place], [address] Phone: [phone]   Fax:  [phone]  Physical Therapy Treatment  Patient Details  Name: Jennifer Green MRN: [id number] Date of Birth: 1940-05-16 Referring Provider: Dr. Merceda Elks  Encounter Date: 10/24/2015      PT End of Session - 10/24/15 1139    Visit Number 5   Number of Visits 10   Date for PT Re-Evaluation 12/05/15   Authorization Type Medicare g-code 10th visit   PT Start Time 1100   PT Stop Time 1139   PT Time Calculation (min) 39 min   Equipment Utilized During Treatment Gait belt   Activity Tolerance Patient tolerated treatment well   Behavior During Therapy [facility] for tasks assessed/performed      Past Medical History:  Diagnosis Date  . Allergy   . Elevated blood sugar    790.21  . GERD (gastroesophageal reflux disease)   . Glaucoma   . Gout   . Hypertension   . Hypokalemia   . Hypomagnesemia   . Insomnia   . Murmur   . OP (osteoporosis)   . PVC (premature ventricular contraction)     Past Surgical History:  Procedure Laterality Date  . CERVICAL FUSION  05/2010  . EYE SURGERY     right and left    There were no vitals filed for this visit.      Subjective Assessment - 10/24/15 1103    Subjective going down steps is not easier. I feel my balance has improved. Exercise witll make my ankle pain better. I am more concious about keeping my feet straighter.    Patient Stated Goals Patient wants to walk like she used to    Currently in Pain? Yes   Pain Score 2    Pain Location Ankle   Pain Orientation Right   Pain Descriptors / Indicators Dull;Sore   Pain Type Chronic pain   Pain Onset More than a month ago   Pain Frequency Intermittent   Aggravating Factors  walk more than 1/2 mile   Pain Relieving Factors not walking   Multiple Pain Sites No            [facility] PT Assessment - 10/24/15 0001      Strength   Right Hip  Flexion 4/5   Right Knee Flexion 4/5   Right Knee Extension 4/5     Dynamic Gait Index   Level Surface Mild Impairment   Change in Gait Speed Mild Impairment   Gait with Horizontal Head Turns Mild Impairment   Gait with Vertical Head Turns Moderate Impairment   Gait and Pivot Turn Mild Impairment   Step Over Obstacle Normal   Step Around Obstacles Normal   Steps Mild Impairment   Total Score 17                     [facility] Adult PT Treatment/Exercise - 10/24/15 0001      High Level Balance   High Level Balance Activities Head turns;Tandem walking  walk with looking up and down with gait bel     Knee/Hip Exercises: Machines for Strengthening   Cybex Knee Extension 5# 15x2   Cybex Knee Flexion 20# 15x2  VC to DF ankle, not IR right hip   Cybex Leg Press seat #3 50# 2 legs 10x2; heel raises 30# 15 x   VC to move through the ankle motion     Knee/Hip Exercises: Standing  Step Down 1 set;Hand Hold: 2;15 reps   Step Down Limitations go up and down stairs 10 times working on control     Ankle Exercises: Aerobic   Stationary Bike Nu-Step L2 6 min seat # 4 arm #9     Ankle Exercises: Standing   Other Standing Ankle Exercises high step throught the ladder 6 times with difficulty and C.G.  used a gait belt                PT Education - 10/24/15 1139    Education provided No          PT Short Term Goals - 10/24/15 1136      PT SHORT TERM GOAL #1   Title independent with initial HEP   Time 4   Period Weeks   Status Achieved     PT SHORT TERM GOAL #2   Title Dynamic gait index improved </= 20 points   Time 4   Period Weeks   Status On-going  17/24     PT SHORT TERM GOAL #3   Title pain in right ankle and foot decreased >/= 25%   Time 4   Period Weeks   Status On-going  no change           PT Long Term Goals - 10/12/15 1843      PT LONG TERM GOAL #1   Title independent with HEP   Time 8   Period Weeks   Status On-going     PT  LONG TERM GOAL #2   Title walk for 1 mile feeling steady due to right leg strength >/= 4+/5 and no whipping of right forefoot during stance phase of gait   Time 8   Period Weeks   Status On-going     PT LONG TERM GOAL #3   Title going down steps without difficulty with step over step pattern due to right knee extension strength 5/5   Time 8   Period Weeks   Status On-going     PT LONG TERM GOAL #4   Title Dynamic Gait assessment >/= 22 points due to increased balance   Time 8   Period Weeks   Status On-going     PT LONG TERM GOAL #5   Title stand on one foot for 5-10 seconds to improve stance phase of gait   Time 8   Period Weeks   Status On-going               Plan - 10/24/15 1139    Clinical Impression Statement Patient has not change in right ankle pain.  Patient able to walk with less of a whip of right ankle. Dynamic Balance test improved to 17/24.  Patient feels her balance has improved.  Stairs are getting easier.  Patient has difficulty with high step marching and tandem walking. Patient will benefit from skilled therapy to imrprove gait and mobility.    Rehab Potential Excellent   Clinical Impairments Affecting Rehab Potential None   PT Frequency 2x / week   PT Duration 8 weeks   PT Treatment/Interventions Cryotherapy;Electrical Stimulation;Iontophoresis 4mg /ml Dexamethasone;Stair training;Gait training;Ultrasound;Moist Heat;Therapeutic activities;Therapeutic exercise;Balance training;Neuromuscular re-education;Patient/family education;Passive range of motion;Manual techniques;Dry needling;Energy conservation   PT Next Visit Plan right hip, knee and plantarflexion strength, work on walking with head movements, tandem walking, walk with no whip of right foot, balance exercises, stair climbing downward, modalities as needed   PT Home Exercise Plan progress as needed   Consulted and Agree  with Plan of Care Patient      Patient will benefit from skilled therapeutic  intervention in order to improve the following deficits and impairments:  Abnormal gait, Decreased coordination, Difficulty walking, Increased muscle spasms, Pain, Decreased activity tolerance, Decreased balance, Decreased strength  Visit Diagnosis: Muscle weakness (generalized)  Other abnormalities of gait and mobility     Problem List Patient Active Problem List   Diagnosis Date Noted  . Sinus bradycardia 10/06/2015  . 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 Dyspnea and respiratory abnormality 02/25/2014  . Ataxia 08/12/2013  . Peripheral edema 08/12/2013  . Meningioma ([place]) 04/29/2013  . Hearing loss 03/23/2013  . Hemoptysis 02/23/2013  . PVC's (premature ventricular contractions) 07/15/2011  . Coronary artery calcification seen on CAT scan 03/28/2011  . Lung nodule 12/31/2010  . Chronic cough 12/31/2010  . Hypokalemia 12/05/2010  . Irregular heart rate 12/05/2010  . Insomnia, unspecified 02/16/2009  . CHEST WALL PAIN, ACUTE 12/30/2008  . [place] DISEASE, CERVICAL 03/24/2007  . GOUT 07/11/2006  . Essential hypertension 07/11/2006  . ALLERGIC RHINITIS 07/11/2006  . GERD 07/11/2006    Earlie Counts, PT 10/24/15 11:43 AM    [facility] [address], [place], [place], [address] Phone: [phone]   Fax:  [phone]  Name: Jennifer Green MRN: [id number] Date of Birth: 1940/10/11

## 2015-10-26 ENCOUNTER — Encounter: Payer: Self-pay | Admitting: Physical Therapy

## 2015-10-26 ENCOUNTER — Ambulatory Visit: Payer: Medicare Other | Admitting: Physical Therapy

## 2015-10-26 DIAGNOSIS — M6281 Muscle weakness (generalized): Secondary | ICD-10-CM | POA: Diagnosis not present

## 2015-10-26 DIAGNOSIS — R2689 Other abnormalities of gait and mobility: Secondary | ICD-10-CM

## 2015-10-26 NOTE — Therapy (Signed)
[facility] [address], [place], [place], [address] Phone: [phone]   Fax:  [phone]  Physical Therapy Treatment  Patient Details  Name: Jennifer Green MRN: [id number] Date of Birth: [birth year] Referring Provider: Dr. Merceda Elks  Encounter Date: 10/26/2015      PT End of Session - 10/26/15 1019    Visit Number 6   Number of Visits 10   Date for PT Re-Evaluation 12/05/15   Authorization Type Medicare g-code 10th visit   PT Start Time 1017   PT Stop Time 1055   PT Time Calculation (min) 38 min   Equipment Utilized During Treatment Gait belt   Activity Tolerance Patient tolerated treatment well   Behavior During Therapy [facility] for tasks assessed/performed      Past Medical History:  Diagnosis Date  . Allergy   . Elevated blood sugar    790.21  . GERD (gastroesophageal reflux disease)   . Glaucoma   . Gout   . Hypertension   . Hypokalemia   . Hypomagnesemia   . Insomnia   . Murmur   . OP (osteoporosis)   . PVC (premature ventricular contraction)     Past Surgical History:  Procedure Laterality Date  . CERVICAL FUSION  05/2010  . EYE SURGERY     right and left    There were no vitals filed for this visit.      Subjective Assessment - 10/26/15 1020    Subjective I was tired after last visit.    Limitations Walking   Patient Stated Goals Patient wants to walk like she used to    Currently in Pain? No/denies                         [facility] Adult PT Treatment/Exercise - 10/26/15 0001      Knee/Hip Exercises: Machines for Strengthening   Cybex Knee Extension 20# 10x   Cybex Knee Flexion 20# 15x2  VC to DF ankle, not IR right hip   Cybex Leg Press seat #3 75# 2 legs 10x2  VC to move through the ankle motion     Knee/Hip Exercises: Standing   Step Down 1 set;Hand Hold: 2;15 reps;5 reps   Step Down Limitations working on control going down   Other Standing Knee Exercises  walking sideways with red band on thighs 20 feet 4 times with gait belt     Knee/Hip Exercises: Sidelying   Clams 3 sets of 10 on right     Ankle Exercises: Stretches   Gastroc Stretch 2 reps;30 seconds  bil. on step   Other Stretch hamstring in sitting hold 30 sec. 1 time bil.    Other Stretch manually stretched righ thip ER and posterior capsule     Ankle Exercises: Standing   Heel Raises 20 reps  on step, bil. feet on step   Other Standing Ankle Exercises high step throught the ladder 6 times with difficulty and C.G.  used a gait belt     Ankle Exercises: Aerobic   Stationary Bike Nu-Step L2 6 min seat # 4 arm #9                  PT Short Term Goals - 10/24/15 1136      PT SHORT TERM GOAL #1   Title independent with initial HEP   Time 4   Period Weeks   Status Achieved     PT SHORT TERM  GOAL #2   Title Dynamic gait index improved </= 20 points   Time 4   Period Weeks   Status On-going  17/24     PT SHORT TERM GOAL #3   Title pain in right ankle and foot decreased >/= 25%   Time 4   Period Weeks   Status On-going  no change           PT Long Term Goals - 10/12/15 1843      PT LONG TERM GOAL #1   Title independent with HEP   Time 8   Period Weeks   Status On-going     PT LONG TERM GOAL #2   Title walk for 1 mile feeling steady due to right leg strength >/= 4+/5 and no whipping of right forefoot during stance phase of gait   Time 8   Period Weeks   Status On-going     PT LONG TERM GOAL #3   Title going down steps without difficulty with step over step pattern due to right knee extension strength 5/5   Time 8   Period Weeks   Status On-going     PT LONG TERM GOAL #4   Title Dynamic Gait assessment >/= 22 points due to increased balance   Time 8   Period Weeks   Status On-going     PT LONG TERM GOAL #5   Title stand on one foot for 5-10 seconds to improve stance phase of gait   Time 8   Period Weeks   Status On-going                Plan - 10/26/15 1054    Clinical Impression Statement Patient will turn her right knee inward when coming down the steps due to tight right hip capsule and weak ER.  Patient is able to walk with heel toe gait.  Patient is able to do increased wieght with leg press and knee extension.  Patient will benefit form physical therapy to improve strength and balance.    Rehab Potential Excellent   Clinical Impairments Affecting Rehab Potential None   PT Frequency 2x / week   PT Duration 8 weeks   PT Treatment/Interventions Cryotherapy;Electrical Stimulation;Iontophoresis 4mg /ml Dexamethasone;Stair training;Gait training;Ultrasound;Moist Heat;Therapeutic activities;Therapeutic exercise;Balance training;Neuromuscular re-education;Patient/family education;Passive range of motion;Manual techniques;Dry needling;Energy conservation   PT Next Visit Plan right hip, knee and plantarflexion strength, work on walking with head movements, tandem walking, walk with no whip of right foot, balance exercises, stair climbing downward, modalities as needed   PT Home Exercise Plan progress as needed   Consulted and Agree with Plan of Care Patient      Patient will benefit from skilled therapeutic intervention in order to improve the following deficits and impairments:  Abnormal gait, Decreased coordination, Difficulty walking, Increased muscle spasms, Pain, Decreased activity tolerance, Decreased balance, Decreased strength  Visit Diagnosis: Muscle weakness (generalized)  Other abnormalities of gait and mobility     Problem List Patient Active Problem List   Diagnosis Date Noted  . Sinus bradycardia 10/06/2015  . 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 Dyspnea and respiratory abnormality 02/25/2014  . Ataxia 08/12/2013  . Peripheral edema 08/12/2013  . Meningioma ([place]) 04/29/2013  . Hearing loss 03/23/2013  . Hemoptysis 02/23/2013  . PVC's (premature ventricular contractions)  07/15/2011  . Coronary artery calcification seen on CAT scan 03/28/2011  . Lung nodule 12/31/2010  . Chronic cough 12/31/2010  . Hypokalemia 12/05/2010  . Irregular heart rate 12/05/2010  .  Insomnia, unspecified 02/16/2009  . CHEST WALL PAIN, ACUTE 12/30/2008  . [place] DISEASE, CERVICAL 03/24/2007  . GOUT 07/11/2006  . Essential hypertension 07/11/2006  . ALLERGIC RHINITIS 07/11/2006  . GERD 07/11/2006    Earlie Counts, PT 10/26/15 10:56 AM   [facility] [address], [place], [place], [address] Phone: [phone]   Fax:  [phone]  Name: Jennifer Green MRN: [id number] Date of Birth: 12-22-1940

## 2015-11-02 ENCOUNTER — Ambulatory Visit: Payer: Medicare Other | Admitting: Physical Therapy

## 2015-11-02 ENCOUNTER — Encounter: Payer: Self-pay | Admitting: Physical Therapy

## 2015-11-02 DIAGNOSIS — M6281 Muscle weakness (generalized): Secondary | ICD-10-CM

## 2015-11-02 DIAGNOSIS — R2689 Other abnormalities of gait and mobility: Secondary | ICD-10-CM

## 2015-11-02 NOTE — Therapy (Signed)
[facility] [address], [place], [place], [address] Phone: [phone]   Fax:  [phone]  Physical Therapy Treatment  Patient Details  Name: Jennifer Green MRN: [id number] Date of Birth: May 23, 1940 Referring Provider: Dr. Merceda Elks  Encounter Date: 11/02/2015      PT End of Session - 11/02/15 1014    Visit Number 7   Number of Visits 10   Date for PT Re-Evaluation 12/05/15   Authorization Type Medicare g-code 10th visit   PT Start Time 1013   PT Stop Time 1055   PT Time Calculation (min) 42 min   Activity Tolerance Patient tolerated treatment well   Behavior During Therapy [facility] for tasks assessed/performed      Past Medical History:  Diagnosis Date  . Allergy   . Elevated blood sugar    790.21  . GERD (gastroesophageal reflux disease)   . Glaucoma   . Gout   . Hypertension   . Hypokalemia   . Hypomagnesemia   . Insomnia   . Murmur   . OP (osteoporosis)   . PVC (premature ventricular contraction)     Past Surgical History:  Procedure Laterality Date  . CERVICAL FUSION  05/2010  . EYE SURGERY     right and left    There were no vitals filed for this visit.      Subjective Assessment - 11/02/15 1013    Subjective Pt reports minimal pain today just "a clinge here and there".    Limitations Walking   Patient Stated Goals Patient wants to walk like she used to    Currently in Pain? Yes   Pain Score 1    Pain Location Ankle   Pain Orientation Right                         [facility] Adult PT Treatment/Exercise - 11/02/15 0001      Knee/Hip Exercises: Machines for Strengthening   Cybex Knee Extension 20# 10x   Cybex Knee Flexion 20# 15x2  VC to DF ankle, not IR right hip   Cybex Leg Press seat #3 75# 2 legs 10x2  VC to move through the ankle motion     Knee/Hip Exercises: Standing   Hip Flexion AROM;Stengthening;2 sets;10 reps   Hip Abduction AROM;Stengthening;Both;2  sets;10 reps  verbal cues for quality   Hip Extension AROM;Stengthening;Both;2 sets;10 reps  verbal cues for technique   Lateral Step Up Right;3 sets;5 reps;Step Height: 4"   Step Down 1 set;Hand Hold: 2;15 reps;5 reps   Step Down Limitations focus on control   SLS 30 sec holds Rt LT  on blue mat   Other Standing Knee Exercises Sit to stand with yellow band  verbal cues to push knees out   Other Standing Knee Exercises walking sideways with red band on thighs 20 feet 4 times with gait belt     Ankle Exercises: Standing   Heel Raises 20 reps  on step, bil. feet on step     Ankle Exercises: Aerobic   Stationary Bike Bike L1 7 minutes     Ankle Exercises: Stretches   Soleus Stretch 1 rep;10 seconds   Gastroc Stretch 2 reps;30 seconds  bil. on step   Other Stretch hamstring in sitting hold 30 sec. 1 time bil.                   PT Short Term Goals - 11/02/15 1014  PT SHORT TERM GOAL #1   Title independent with initial HEP   Time 4   Period Weeks   Status Achieved     PT SHORT TERM GOAL #2   Title Dynamic gait index improved </= 20 points   Time 4   Period Weeks   Status On-going     PT SHORT TERM GOAL #3   Title pain in right ankle and foot decreased >/= 25%   Time 4   Period Weeks   Status On-going           PT Long Term Goals - 11/02/15 1015      PT LONG TERM GOAL #1   Title independent with HEP   Time 8   Period Weeks   Status On-going     PT LONG TERM GOAL #2   Title walk for 1 mile feeling steady due to right leg strength >/= 4+/5 and no whipping of right forefoot during stance phase of gait   Time 8   Period Weeks   Status On-going     PT LONG TERM GOAL #3   Title going down steps without difficulty with step over step pattern due to right knee extension strength 5/5   Time 8   Period Weeks   Status On-going     PT LONG TERM GOAL #4   Title Dynamic Gait assessment >/= 22 points due to increased balance   Time 8   Period Weeks    Status On-going     PT LONG TERM GOAL #5   Title stand on one foot for 5-10 seconds to improve stance phase of gait   Time 8   Period Weeks   Status On-going               Plan - 11/02/15 1055    Clinical Impression Statement Pt repotrs no pain today. Continues to have difficutly with stair decending and balance. Pt compesates with hip internal rotation for knee extension and needs verbal and occasional tactile cues to externally rotate hip. Able to tolerate all exercises well. Will continue to increase LE strength and progress towards goals.    Rehab Potential Excellent   Clinical Impairments Affecting Rehab Potential None   PT Frequency 2x / week   PT Duration 8 weeks   PT Treatment/Interventions Cryotherapy;Electrical Stimulation;Iontophoresis 4mg /ml Dexamethasone;Stair training;Gait training;Ultrasound;Moist Heat;Therapeutic activities;Therapeutic exercise;Balance training;Neuromuscular re-education;Patient/family education;Passive range of motion;Manual techniques;Dry needling;Energy conservation   PT Next Visit Plan right hip, knee and plantarflexion strength, work on walking with head movements, tandem walking, walk with no whip of right foot, balance exercises, stair climbing downward, modalities as needed   Consulted and Agree with Plan of Care Patient      Patient will benefit from skilled therapeutic intervention in order to improve the following deficits and impairments:  Abnormal gait, Decreased coordination, Difficulty walking, Increased muscle spasms, Pain, Decreased activity tolerance, Decreased balance, Decreased strength  Visit Diagnosis: Muscle weakness (generalized)  Other abnormalities of gait and mobility     Problem List Patient Active Problem List   Diagnosis Date Noted  . Sinus bradycardia 10/06/2015  . 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 Dyspnea and respiratory abnormality 02/25/2014  . Ataxia 08/12/2013  . Peripheral edema  08/12/2013  . Meningioma ([place]) 04/29/2013  . Hearing loss 03/23/2013  . Hemoptysis 02/23/2013  . PVC's (premature ventricular contractions) 07/15/2011  . Coronary artery calcification seen on CAT scan 03/28/2011  . Lung nodule 12/31/2010  . Chronic cough 12/31/2010  .  Hypokalemia 12/05/2010  . Irregular heart rate 12/05/2010  . Insomnia, unspecified 02/16/2009  . CHEST WALL PAIN, ACUTE 12/30/2008  . [place] DISEASE, CERVICAL 03/24/2007  . GOUT 07/11/2006  . Essential hypertension 07/11/2006  . ALLERGIC RHINITIS 07/11/2006  . GERD 07/11/2006    Mikle Bosworth PTA 11/02/2015, 10:58 AM  [facility] [address] [place], [place], [address] Phone: [phone]   Fax:  [phone]  Name: Jennifer Green MRN: [id number] Date of Birth: May 05, 1940

## 2015-11-07 ENCOUNTER — Ambulatory Visit: Payer: Medicare Other | Admitting: Physical Therapy

## 2015-11-07 ENCOUNTER — Encounter: Payer: Self-pay | Admitting: Physical Therapy

## 2015-11-07 DIAGNOSIS — M6281 Muscle weakness (generalized): Secondary | ICD-10-CM

## 2015-11-07 DIAGNOSIS — R2689 Other abnormalities of gait and mobility: Secondary | ICD-10-CM

## 2015-11-07 NOTE — Therapy (Signed)
[facility] [address], [place], [place], [address] Phone: [phone]   Fax:  [phone]  Physical Therapy Treatment  Patient Details  Name: Jennifer Green MRN: [id number] Date of Birth: [birth year] Referring Provider: Dr. Merceda Elks  Encounter Date: 11/07/2015      PT End of Session - 11/07/15 1052    Visit Number 8   Number of Visits 10   Date for PT Re-Evaluation 12/05/15   Authorization Type Medicare g-code 10th visit   PT Start Time [id number]   PT Stop Time 1055   PT Time Calculation (min) 40 min   Equipment Utilized During Treatment Gait belt   Activity Tolerance Patient tolerated treatment well   Behavior During Therapy [facility] for tasks assessed/performed      Past Medical History:  Diagnosis Date  . Allergy   . Elevated blood sugar    790.21  . GERD (gastroesophageal reflux disease)   . Glaucoma   . Gout   . Hypertension   . Hypokalemia   . Hypomagnesemia   . Insomnia   . Murmur   . OP (osteoporosis)   . PVC (premature ventricular contraction)     Past Surgical History:  Procedure Laterality Date  . CERVICAL FUSION  05/2010  . EYE SURGERY     right and left    There were no vitals filed for this visit.                       [facility] Adult PT Treatment/Exercise - 11/07/15 0001      Knee/Hip Exercises: Machines for Strengthening   Cybex Knee Extension 20# 15x   Cybex Knee Flexion 20# 15x2  VC to DF ankle, not IR right hip   Cybex Leg Press seat #3 80# 2 legs 10x2  VC to move through the ankle motion     Knee/Hip Exercises: Standing   Hip Abduction AROM;Stengthening;Both;2 sets;10 reps  verbal cues for quality   Hip Extension AROM;Stengthening;Both;2 sets;10 reps  verbal cues for technique   Forward Step Up 1 set;10 reps;Hand Hold: 1   Step Down 1 set;Hand Hold: 2;15 reps;5 reps   Step Down Limitations focus on control   Wall Squat 5 sets   Wall Squat Limitations  with verbal instructions for knee alignment and to slow down for control   Other Standing Knee Exercises 20 feet x 4 each exercises  Pt needed CGA      Ankle Exercises: Aerobic   Stationary Bike Bike L1 7 minutes     Ankle Exercises: Stretches   Gastroc Stretch 2 reps;30 seconds  bil. on step   Other Stretch hamstring in sitting hold 30 sec. 1 time bil.                 PT Education - 11/07/15 1051    Education provided Yes   Education Details Standing LE exercises, walking with heel to toe gait pattern   Person(s) Educated Patient   Methods Explanation;Demonstration   Comprehension Verbalized understanding;Returned demonstration          PT Short Term Goals - 11/07/15 1057      PT SHORT TERM GOAL #1   Title independent with initial HEP   Period Weeks   Status Achieved     PT SHORT TERM GOAL #2   Title Dynamic gait index improved </= 20 points   Time 4   Period Weeks   Status On-going  PT SHORT TERM GOAL #3   Title pain in right ankle and foot decreased >/= 25%   Time 4   Period Weeks   Status On-going           PT Long Term Goals - 11/07/15 1057      PT LONG TERM GOAL #1   Title independent with HEP   Time 8   Period Weeks   Status On-going     PT LONG TERM GOAL #2   Title walk for 1 mile feeling steady due to right leg strength >/= 4+/5 and no whipping of right forefoot during stance phase of gait   Time 8   Period Weeks   Status On-going     PT LONG TERM GOAL #3   Title going down steps without difficulty with step over step pattern due to right knee extension strength 5/5   Time 8   Period Weeks   Status On-going     PT LONG TERM GOAL #4   Title Dynamic Gait assessment >/= 22 points due to increased balance   Time 8   Period Weeks   Status On-going     PT LONG TERM GOAL #5   Title stand on one foot for 5-10 seconds to improve stance phase of gait   Time 8   Period Weeks   Status On-going               Plan -  11/07/15 1055    Clinical Impression Statement Pt reporting pain of 5/10 upon entry level. Pt still reporting difficutly with balance. Pt instructed in standing balance activities and gait training along with LE strengtheing. Pt is progressing toward her LTG's.    Rehab Potential Excellent   Clinical Impairments Affecting Rehab Potential None   PT Frequency 2x / week   PT Duration 8 weeks   PT Treatment/Interventions Cryotherapy;Electrical Stimulation;Iontophoresis 4mg /ml Dexamethasone;Stair training;Gait training;Ultrasound;Moist Heat;Therapeutic activities;Therapeutic exercise;Balance training;Neuromuscular re-education;Patient/family education;Passive range of motion;Manual techniques;Dry needling;Energy conservation   PT Next Visit Plan right hip, knee and plantarflexion strength, work on walking with head movements, tandem walking, walk with no whip of right foot, balance exercises, stair climbing downward, modalities as needed   PT Home Exercise Plan progress as needed   Consulted and Agree with Plan of Care Patient      Patient will benefit from skilled therapeutic intervention in order to improve the following deficits and impairments:  Abnormal gait, Decreased coordination, Difficulty walking, Increased muscle spasms, Pain, Decreased activity tolerance, Decreased balance, Decreased strength  Visit Diagnosis: Muscle weakness (generalized)  Other abnormalities of gait and mobility     Problem List Patient Active Problem List   Diagnosis Date Noted  . Sinus bradycardia 10/06/2015  . 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 Dyspnea and respiratory abnormality 02/25/2014  . Ataxia 08/12/2013  . Peripheral edema 08/12/2013  . Meningioma ([place]) 04/29/2013  . Hearing loss 03/23/2013  . Hemoptysis 02/23/2013  . PVC's (premature ventricular contractions) 07/15/2011  . Coronary artery calcification seen on CAT scan 03/28/2011  . Lung nodule 12/31/2010  . Chronic cough  12/31/2010  . Hypokalemia 12/05/2010  . Irregular heart rate 12/05/2010  . Insomnia, unspecified 02/16/2009  . CHEST WALL PAIN, ACUTE 12/30/2008  . [place] DISEASE, CERVICAL 03/24/2007  . GOUT 07/11/2006  . Essential hypertension 07/11/2006  . ALLERGIC RHINITIS 07/11/2006  . GERD 07/11/2006    Oretha Caprice, MPT  11/07/2015, 11:01 AM  [facility] [address],  [place], [place], [address] Phone: [phone]   Fax:  [phone]  Name: Jennifer Green MRN: [id number] Date of Birth: 03-16-1940

## 2015-11-09 ENCOUNTER — Encounter: Payer: Self-pay | Admitting: Physical Therapy

## 2015-11-09 ENCOUNTER — Ambulatory Visit: Payer: Medicare Other | Admitting: Physical Therapy

## 2015-11-09 DIAGNOSIS — R2689 Other abnormalities of gait and mobility: Secondary | ICD-10-CM

## 2015-11-09 DIAGNOSIS — M6281 Muscle weakness (generalized): Secondary | ICD-10-CM | POA: Diagnosis not present

## 2015-11-09 NOTE — Therapy (Signed)
[facility] [address], [place], [place], [address] Phone: [phone]   Fax:  [phone]  Physical Therapy Treatment  Patient Details  Name: Jennifer Green MRN: [id number] Date of Birth: 02/08/1941 Referring Provider: Dr. Merceda Elks  Encounter Date: 11/09/2015      PT End of Session - 11/09/15 1100    Visit Number 9   Number of Visits 10   Date for PT Re-Evaluation 12/05/15   Authorization Type Medicare g-code 10th visit   PT Start Time 1059   PT Stop Time 1141   PT Time Calculation (min) 42 min   Activity Tolerance Patient tolerated treatment well   Behavior During Therapy [facility] for tasks assessed/performed      Past Medical History:  Diagnosis Date  . Allergy   . Elevated blood sugar    790.21  . GERD (gastroesophageal reflux disease)   . Glaucoma   . Gout   . Hypertension   . Hypokalemia   . Hypomagnesemia   . Insomnia   . Murmur   . OP (osteoporosis)   . PVC (premature ventricular contraction)     Past Surgical History:  Procedure Laterality Date  . CERVICAL FUSION  05/2010  . EYE SURGERY     right and left    There were no vitals filed for this visit.      Subjective Assessment - 11/09/15 1059    Subjective Pt reports no pain today and ankle is feeling good. Pt reports she has been doing a few exercsies at home. Stated "I still can't walk a straight line."   Limitations Walking   Currently in Pain? No/denies                         [facility] Adult PT Treatment/Exercise - 11/09/15 0001      Knee/Hip Exercises: Machines for Strengthening   Cybex Knee Extension 20# 15x   Cybex Knee Flexion 20# 15x2  VC to DF ankle, not IR right hip   Cybex Leg Press seat #3 80# 2 legs 10x2  VC to move through the ankle motion     Knee/Hip Exercises: Standing   Hip Abduction AROM;Stengthening;Both;2 sets;10 reps  verbal cues for quality (yellow tband)   Hip Extension  AROM;Stengthening;Both;2 sets;10 reps  verbal cues for technique (yellow tband)   Lateral Step Up Both;2 sets;10 reps;Step Height: 4";Hand Hold: 1   Forward Step Up 1 set;10 reps;Hand Hold: 1   Step Down 1 set;Hand Hold: 2;15 reps;5 reps   Step Down Limitations focus on control   Wall Squat 5 sets   Wall Squat Limitations with verbal instructions for knee alignment and to slow down for control   Other Standing Knee Exercises Sit to stand with yellow band  verbal cues to push knees out (yellow tband)   Other Standing Knee Exercises Cone walking     Ankle Exercises: Aerobic   Stationary Bike Bike L1 7 minutes                  PT Short Term Goals - 11/07/15 1057      PT SHORT TERM GOAL #1   Title independent with initial HEP   Period Weeks   Status Achieved     PT SHORT TERM GOAL #2   Title Dynamic gait index improved </= 20 points   Time 4   Period Weeks   Status On-going     PT SHORT TERM  GOAL #3   Title pain in right ankle and foot decreased >/= 25%   Time 4   Period Weeks   Status On-going           PT Long Term Goals - 11/07/15 1057      PT LONG TERM GOAL #1   Title independent with HEP   Time 8   Period Weeks   Status On-going     PT LONG TERM GOAL #2   Title walk for 1 mile feeling steady due to right leg strength >/= 4+/5 and no whipping of right forefoot during stance phase of gait   Time 8   Period Weeks   Status On-going     PT LONG TERM GOAL #3   Title going down steps without difficulty with step over step pattern due to right knee extension strength 5/5   Time 8   Period Weeks   Status On-going     PT LONG TERM GOAL #4   Title Dynamic Gait assessment >/= 22 points due to increased balance   Time 8   Period Weeks   Status On-going     PT LONG TERM GOAL #5   Title stand on one foot for 5-10 seconds to improve stance phase of gait   Time 8   Period Weeks   Status On-going               Plan - 11/09/15 1142     Clinical Impression Statement Pt continues to have gait abnormalities and difficulty with stair climbing. Pt lacking eccentric control with decending steps. Pt needing moderate verbal cues for hip flexion and no incternal rotation and dorsi flxion.  Pt would continue to benefit from skilled therapy for strength and gait training.    Rehab Potential Excellent   Clinical Impairments Affecting Rehab Potential None   PT Frequency 2x / week   PT Duration 8 weeks   PT Treatment/Interventions Cryotherapy;Electrical Stimulation;Iontophoresis 4mg /ml Dexamethasone;Stair training;Gait training;Ultrasound;Moist Heat;Therapeutic activities;Therapeutic exercise;Balance training;Neuromuscular re-education;Patient/family education;Passive range of motion;Manual techniques;Dry needling;Energy conservation   PT Next Visit Plan right hip, knee and plantarflexion strength, work on walking with head movements, cone walking, walk with no whip of right foot, balance exercises, stair climbing downward, modalities as needed   Consulted and Agree with Plan of Care Patient      Patient will benefit from skilled therapeutic intervention in order to improve the following deficits and impairments:  Abnormal gait, Decreased coordination, Difficulty walking, Increased muscle spasms, Pain, Decreased activity tolerance, Decreased balance, Decreased strength  Visit Diagnosis: Muscle weakness (generalized)  Other abnormalities of gait and mobility     Problem List Patient Active Problem List   Diagnosis Date Noted  . Sinus bradycardia 10/06/2015  . 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 Dyspnea and respiratory abnormality 02/25/2014  . Ataxia 08/12/2013  . Peripheral edema 08/12/2013  . Meningioma ([place]) 04/29/2013  . Hearing loss 03/23/2013  . Hemoptysis 02/23/2013  . PVC's (premature ventricular contractions) 07/15/2011  . Coronary artery calcification seen on CAT scan 03/28/2011  . Lung nodule 12/31/2010  .  Chronic cough 12/31/2010  . Hypokalemia 12/05/2010  . Irregular heart rate 12/05/2010  . Insomnia, unspecified 02/16/2009  . CHEST WALL PAIN, ACUTE 12/30/2008  . [place] DISEASE, CERVICAL 03/24/2007  . GOUT 07/11/2006  . Essential hypertension 07/11/2006  . ALLERGIC RHINITIS 07/11/2006  . GERD 07/11/2006    Mikle Bosworth PTA 11/09/2015, 11:54 AM  [facility] [address],  [place], [place], [address] Phone: [phone]   Fax:  [phone]  Name: ROSAMARIA ZAKARIAN MRN: [id number] Date of Birth: [birth year]

## 2015-11-14 ENCOUNTER — Encounter: Payer: Self-pay | Admitting: Physical Therapy

## 2015-11-14 ENCOUNTER — Ambulatory Visit: Payer: Medicare Other | Attending: Family Medicine | Admitting: Physical Therapy

## 2015-11-14 DIAGNOSIS — M6281 Muscle weakness (generalized): Secondary | ICD-10-CM | POA: Insufficient documentation

## 2015-11-14 DIAGNOSIS — R2689 Other abnormalities of gait and mobility: Secondary | ICD-10-CM | POA: Insufficient documentation

## 2015-11-14 NOTE — Therapy (Signed)
[facility] [address], [place], [place], [address] Phone: [phone]   Fax:  [phone]  Physical Therapy Treatment  Patient Details  Name: Jennifer Green MRN: [id number] Date of Birth: August 06, 1940 Referring Provider: Dr. Merceda Elks  Encounter Date: 11/14/2015      PT End of Session - 11/14/15 1033    Visit Number 10   Number of Visits 20   Date for PT Re-Evaluation 12/05/15   Authorization Type Medicare g-code 20th visit   PT Start Time 8502   PT Stop Time 1100   PT Time Calculation (min) 45 min   Activity Tolerance Patient tolerated treatment well   Behavior During Therapy [facility] for tasks assessed/performed      Past Medical History:  Diagnosis Date  . Allergy   . Elevated blood sugar    790.21  . GERD (gastroesophageal reflux disease)   . Glaucoma   . Gout   . Hypertension   . Hypokalemia   . Hypomagnesemia   . Insomnia   . Murmur   . OP (osteoporosis)   . PVC (premature ventricular contraction)     Past Surgical History:  Procedure Laterality Date  . CERVICAL FUSION  05/2010  . EYE SURGERY     right and left    There were no vitals filed for this visit.      Subjective Assessment - 11/14/15 1019    Subjective I feel like I am getting better. I am walking a mile instead of a 1/2 mile.    Patient Stated Goals Patient wants to walk like she used to    Currently in Pain? No/denies            [facility] PT Assessment - 11/14/15 0001      Assessment   Medical Diagnosis R26.81 Unsteady gait   Referring Provider Dr. Merceda Elks   Onset Date/Surgical Date 04/11/15   Prior Therapy on right ankle     Precautions   Precautions Other (comment)   Precaution Comments osteoporosis     Restrictions   Weight Bearing Restrictions No     Balance Screen   Has the patient fallen in the past 6 months No   Has the patient had a decrease in activity level because of a fear of falling?  No   Is  the patient reluctant to leave their home because of a fear of falling?  No     Home Ec[employer] residence     Prior Function   Level of [place] Retired     Associate Professor   Overall Cognitive Status Within Functional Limits for tasks assessed     ROM / Strength   AROM / PROM / Strength AROM;Strength     Strength   Right Hip Flexion 4/5   Right Hip Extension 4/5   Right Hip ABduction 4-/5   Left Hip Extension 5/5   Right Knee Flexion 5/5   Right Knee Extension 5/5   Right Ankle Plantar Flexion 4-/5   Left Ankle Plantar Flexion 4-/5     Ambulation/Gait   Gait Pattern --  whip out right foot     Standardized Balance Assessment   Five times sit to stand comments  9 sec     Dynamic Gait Index   Level Surface Mild Impairment   Change in Gait Speed Normal   Gait with Horizontal Head Turns Mild Impairment   Gait with Vertical Head  Turns Mild Impairment   Gait and Pivot Turn Normal   Step Over Obstacle Normal   Step Around Obstacles Normal   Steps Mild Impairment   Total Score 20                     [facility] Adult PT Treatment/Exercise - 11/14/15 0001      Knee/Hip Exercises: Standing   Forward Step Up Right;1 set;10 reps  keeping right foot straight   Step Down 10 reps;1 set;Right;Hand Hold: 1  vc to keep foot straight   Other Standing Knee Exercises stand on right foot and bend forward then be upright on right leg 10x;    Other Standing Knee Exercises walk sideways with red band around knees 40 feet 4 times     Ankle Exercises: Standing   Heel Raises 20 reps  bil. holding on     Ankle Exercises: Seated   Other Seated Ankle Exercises ankle plantarflexion  30 times bil. with gray band     Ankle Exercises: Aerobic   Stationary Bike Nustep level 4 just feet 7 minutes                PT Education - 11/14/15 1049    Education provided No          PT Short Term Goals - 11/14/15 1020       PT SHORT TERM GOAL #1   Title independent with initial HEP   Time 4   Status Achieved     PT SHORT TERM GOAL #2   Title Dynamic gait index improved </= 20 points   Time 4   Period Weeks   Status Achieved     PT SHORT TERM GOAL #3   Title pain in right ankle and foot decreased >/= 25%   Time 4   Period Weeks   Status Achieved           PT Long Term Goals - 11/07/15 1057      PT LONG TERM GOAL #1   Title independent with HEP   Time 8   Period Weeks   Status On-going     PT LONG TERM GOAL #2   Title walk for 1 mile feeling steady due to right leg strength >/= 4+/5 and no whipping of right forefoot during stance phase of gait   Time 8   Period Weeks   Status On-going     PT LONG TERM GOAL #3   Title going down steps without difficulty with step over step pattern due to right knee extension strength 5/5   Time 8   Period Weeks   Status On-going     PT LONG TERM GOAL #4   Title Dynamic Gait assessment >/= 22 points due to increased balance   Time 8   Period Weeks   Status On-going     PT LONG TERM GOAL #5   Title stand on one foot for 5-10 seconds to improve stance phase of gait   Time 8   Period Weeks   Status On-going               Plan - 11/14/15 1049    Clinical Impression Statement Sit to stand was 9 sec instead of 13 sec.  Dynamic Gait balance improved from 20 sec to 17 sec. Right ankle pain decreased by 25%. Patient is now walking 1 mile instead of 1/3 mile.  Patient has met her STG's. Patient is able to walk with   increased speed.  She has trouble with plantarflexion due to weakness.  Patient right foot continues to whip out with heel strike.  Patient will benefit from skilled therapy to work on balance and strength.    Rehab Potential Excellent   Clinical Impairments Affecting Rehab Potential None   PT Frequency 2x / week   PT Duration 8 weeks   PT Treatment/Interventions Cryotherapy;Electrical Stimulation;Iontophoresis 59m/ml Dexamethasone;Stair  training;Gait training;Ultrasound;Moist Heat;Therapeutic activities;Therapeutic exercise;Balance training;Neuromuscular re-education;Patient/family education;Passive range of motion;Manual techniques;Dry needling;Energy conservation   PT Next Visit Plan right hip, knee and plantarflexion strength, work on walking with head movements, cone walking, walk with no whip of right foot, balance exercises, stair climbing downward, modalities as needed   PT Home Exercise Plan progress as needed   Consulted and Agree with Plan of Care Patient      Patient will benefit from skilled therapeutic intervention in order to improve the following deficits and impairments:  Abnormal gait, Decreased coordination, Difficulty walking, Increased muscle spasms, Pain, Decreased activity tolerance, Decreased balance, Decreased strength  Visit Diagnosis: Muscle weakness (generalized)  Other abnormalities of gait and mobility       G-Codes - 110/29/171032    Functional Assessment Tool Used therapist discretion for Dynamic gait index is 20% limitation   Functional Limitation Mobility: Walking and moving around   Mobility: Walking and Moving Around Current Status (([id number] At least 20 percent but less than 40 percent impaired, limited or restricted   Mobility: Walking and Moving Around Goal Status ([id number] At least 20 percent but less than 40 percent impaired, limited or restricted      Problem List Patient Active Problem List   Diagnosis Date Noted  . Sinus bradycardia 10/06/2015  . 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 Dyspnea and respiratory abnormality 02/25/2014  . Ataxia 08/12/2013  . Peripheral edema 08/12/2013  . Meningioma ([place] 04/29/2013  . Hearing loss 03/23/2013  . Hemoptysis 02/23/2013  . PVC's (premature ventricular contractions) 07/15/2011  . Coronary artery calcification seen on CAT scan 03/28/2011  . Lung nodule 12/31/2010  . Chronic cough 12/31/2010  . Hypokalemia 12/05/2010  .  Irregular heart rate 12/05/2010  . Insomnia, unspecified 02/16/2009  . CHEST WALL PAIN, ACUTE 12/30/2008  . [place], CERV[place] 03/24/2007  . GOUT 07/11/2006  . Essential hypertension 07/11/2006  . ALLERGIC RHINITIS 07/11/2006  . GERD 07/11/2006    CEarlie Counts PT 110/29/201710:54 AM   [facility] [address] [place] [address]: [phone]  Fax:  [phone] Name: Jennifer BACHTELMRN: [id number] of Birth: 205-26-1942

## 2015-11-16 ENCOUNTER — Encounter: Payer: Self-pay | Admitting: Physical Therapy

## 2015-11-16 ENCOUNTER — Ambulatory Visit (INDEPENDENT_AMBULATORY_CARE_PROVIDER_SITE_OTHER): Payer: Medicare Other | Admitting: Orthopedic Surgery

## 2015-11-16 ENCOUNTER — Ambulatory Visit: Payer: Medicare Other | Admitting: Physical Therapy

## 2015-11-16 DIAGNOSIS — M19071 Primary osteoarthritis, right ankle and foot: Secondary | ICD-10-CM | POA: Diagnosis not present

## 2015-11-16 DIAGNOSIS — M6281 Muscle weakness (generalized): Secondary | ICD-10-CM

## 2015-11-16 DIAGNOSIS — R2689 Other abnormalities of gait and mobility: Secondary | ICD-10-CM

## 2015-11-16 NOTE — Therapy (Signed)
[facility] [address] [place], [place], [address] Phone: [phone]   Fax:  [phone]  Physical Therapy Treatment  Patient Details  Name: Jennifer Green MRN: [id number] Date of Birth: 1940-03-13 Referring Provider: Dr. Merceda Elks  Encounter Date: 11/16/2015      PT End of Session - 11/16/15 1103    Visit Number 11   Number of Visits 20   Date for PT Re-Evaluation 12/05/15   Authorization Type Medicare g-code 20th visit   PT Start Time 1017   PT Stop Time 1058   PT Time Calculation (min) 41 min   Activity Tolerance Patient tolerated treatment well   Behavior During Therapy [facility] for tasks assessed/performed      Past Medical History:  Diagnosis Date  . Allergy   . Elevated blood sugar    790.21  . GERD (gastroesophageal reflux disease)   . Glaucoma   . Gout   . Hypertension   . Hypokalemia   . Hypomagnesemia   . Insomnia   . Murmur   . OP (osteoporosis)   . PVC (premature ventricular contraction)     Past Surgical History:  Procedure Laterality Date  . CERVICAL FUSION  05/2010  . EYE SURGERY     right and left    There were no vitals filed for this visit.      Subjective Assessment - 11/16/15 1024    Subjective Pt reports walking straight feels like its getting easier.   Limitations Walking   Currently in Pain? Yes   Pain Score 2    Pain Location Ankle   Pain Orientation Right   Pain Descriptors / Indicators Dull;Sore   Pain Type Chronic pain                         [facility] Adult PT Treatment/Exercise - 11/16/15 0001      Knee/Hip Exercises: Machines for Strengthening   Cybex Leg Press seat #3 80# 2 legs 10x2  VC to move through the ankle motion     Knee/Hip Exercises: Standing   Hip Flexion Stengthening;Both;2 sets;10 reps  yellow tband at ankles   Hip Abduction AROM;Stengthening;Both;2 sets;10 reps  verbal cues for quality (yellow tband)   Hip Extension  AROM;Stengthening;Both;2 sets;10 reps  verbal cues for technique (yellow tband)   Lateral Step Up Both;2 sets;10 reps;Hand Hold: 1;Step Height: 6"  Mirror for biofeedback to keep knee in neutral   Forward Step Up Right;1 set;10 reps  keeping right foot straight   Other Standing Knee Exercises --   Other Standing Knee Exercises Cone walking  with mirror for biofeedback     Ankle Exercises: Standing   SLS --   Rocker Board 2 minutes   Heel Raises 20 reps  bil. holding on     Ankle Exercises: Seated   Other Seated Ankle Exercises ankle plantarflexion  30 times bil. with gray band     Ankle Exercises: Aerobic   Stationary Bike L2 7 minutes                  PT Short Term Goals - 11/14/15 1020      PT SHORT TERM GOAL #1   Title independent with initial HEP   Time 4   Status Achieved     PT SHORT TERM GOAL #2   Title Dynamic gait index improved </= 20 points   Time 4   Period Weeks   Status  Achieved     PT SHORT TERM GOAL #3   Title pain in right ankle and foot decreased >/= 25%   Time 4   Period Weeks   Status Achieved           PT Long Term Goals - 11/07/15 1057      PT LONG TERM GOAL #1   Title independent with HEP   Time 8   Period Weeks   Status On-going     PT LONG TERM GOAL #2   Title walk for 1 mile feeling steady due to right leg strength >/= 4+/5 and no whipping of right forefoot during stance phase of gait   Time 8   Period Weeks   Status On-going     PT LONG TERM GOAL #3   Title going down steps without difficulty with step over step pattern due to right knee extension strength 5/5   Time 8   Period Weeks   Status On-going     PT LONG TERM GOAL #4   Title Dynamic Gait assessment >/= 22 points due to increased balance   Time 8   Period Weeks   Status On-going     PT LONG TERM GOAL #5   Title stand on one foot for 5-10 seconds to improve stance phase of gait   Time 8   Period Weeks   Status On-going                Plan - 11/16/15 1106    Clinical Impression Statement Pt continues to have slightly abnormal gait pattern. Pt right knee compensation with internal rotation during knee extension. Verbal cueing and biofeedback with mirror for correction during exercises. Stand by assist from therapist for safety. Pt wil continue to benefit from skilled therapy for gait training and LE stabilization.    Rehab Potential Excellent   Clinical Impairments Affecting Rehab Potential None   PT Frequency 2x / week   PT Duration 8 weeks   PT Treatment/Interventions Cryotherapy;Electrical Stimulation;Iontophoresis 4mg /ml Dexamethasone;Stair training;Gait training;Ultrasound;Moist Heat;Therapeutic activities;Therapeutic exercise;Balance training;Neuromuscular re-education;Patient/family education;Passive range of motion;Manual techniques;Dry needling;Energy conservation   PT Next Visit Plan Gait training, LE strengthening, balance, steps   Consulted and Agree with Plan of Care Patient      Patient will benefit from skilled therapeutic intervention in order to improve the following deficits and impairments:  Abnormal gait, Decreased coordination, Difficulty walking, Increased muscle spasms, Pain, Decreased activity tolerance, Decreased balance, Decreased strength  Visit Diagnosis: Muscle weakness (generalized)  Other abnormalities of gait and mobility     Problem List Patient Active Problem List   Diagnosis Date Noted  . Sinus bradycardia 10/06/2015  . 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 Dyspnea and respiratory abnormality 02/25/2014  . Ataxia 08/12/2013  . Peripheral edema 08/12/2013  . Meningioma ([place]) 04/29/2013  . Hearing loss 03/23/2013  . Hemoptysis 02/23/2013  . PVC's (premature ventricular contractions) 07/15/2011  . Coronary artery calcification seen on CAT scan 03/28/2011  . Lung nodule 12/31/2010  . Chronic cough 12/31/2010  . Hypokalemia 12/05/2010  . Irregular heart rate 12/05/2010   . Insomnia, unspecified 02/16/2009  . CHEST WALL PAIN, ACUTE 12/30/2008  . [place] DISEASE, CERVICAL 03/24/2007  . GOUT 07/11/2006  . Essential hypertension 07/11/2006  . ALLERGIC RHINITIS 07/11/2006  . GERD 07/11/2006    Mikle Bosworth PTA 11/16/2015, 11:24 AM   [facility] [address], [place], [place], [address] Phone: [phone]   Fax:  [phone]  Name: Jennifer Green MRN: [id number] Date of Birth: Jan 13, 1941

## 2015-11-21 ENCOUNTER — Encounter: Payer: Self-pay | Admitting: Physical Therapy

## 2015-11-21 ENCOUNTER — Ambulatory Visit: Payer: Medicare Other | Admitting: Physical Therapy

## 2015-11-21 DIAGNOSIS — R2689 Other abnormalities of gait and mobility: Secondary | ICD-10-CM

## 2015-11-21 DIAGNOSIS — M6281 Muscle weakness (generalized): Secondary | ICD-10-CM | POA: Diagnosis not present

## 2015-11-21 NOTE — Therapy (Signed)
[facility] [address], [place], [place], [address] Phone: [phone]   Fax:  [phone]  Physical Therapy Treatment  Patient Details  Name: Jennifer Green MRN: [id number] Date of Birth: Oct 07, 1940 Referring Provider: Dr. Merceda Elks  Encounter Date: 11/21/2015      PT End of Session - 11/21/15 1018    Visit Number 12   Number of Visits 20   Date for PT Re-Evaluation 12/05/15   Authorization Type Medicare g-code 20th visit   PT Start Time 1015   PT Stop Time 1055   PT Time Calculation (min) 40 min   Activity Tolerance Patient tolerated treatment well   Behavior During Therapy [facility] for tasks assessed/performed      Past Medical History:  Diagnosis Date  . Allergy   . Elevated blood sugar    790.21  . GERD (gastroesophageal reflux disease)   . Glaucoma   . Gout   . Hypertension   . Hypokalemia   . Hypomagnesemia   . Insomnia   . Murmur   . OP (osteoporosis)   . PVC (premature ventricular contraction)     Past Surgical History:  Procedure Laterality Date  . CERVICAL FUSION  05/2010  . EYE SURGERY     right and left    There were no vitals filed for this visit.      Subjective Assessment - 11/21/15 1016    Subjective Pt reports less pain in ankle today after cotizone shot last Friday.    Limitations Walking   Patient Stated Goals Patient wants to walk like she used to    Currently in Pain? Yes   Pain Score 1    Pain Location Ankle   Pain Orientation Right   Pain Descriptors / Indicators Dull;Sore   Pain Type Chronic pain   Pain Frequency Intermittent                         [facility] Adult PT Treatment/Exercise - 11/21/15 0001      Knee/Hip Exercises: Machines for Strengthening   Cybex Leg Press seat #3 80# 2 legs 10x2  VC to move through the ankle motion     Knee/Hip Exercises: Standing   Hip Flexion Stengthening;Both;2 sets;10 reps  yellow tband at ankles   Hip  Abduction AROM;Stengthening;Both;2 sets;10 reps  verbal cues for quality (yellow tband)   Hip Extension AROM;Stengthening;Both;2 sets;10 reps  verbal cues for technique (yellow tband)   Lateral Step Up Both;2 sets;10 reps;Hand Hold: 1;Step Height: 6"  Mirror for biofeedback to keep knee in neutral   Forward Step Up Right;1 set;10 reps  keeping right foot straight   Other Standing Knee Exercises Resisted walking   Other Standing Knee Exercises Cone walking  with mirror for biofeedback     Ankle Exercises: Standing   SLS blue mat   Rocker Board 2 minutes  balance   Heel Raises 20 reps  bil. holding on     Ankle Exercises: Aerobic   Stationary Bike L2 7 minutes                  PT Short Term Goals - 11/21/15 1018      PT SHORT TERM GOAL #1   Title independent with initial HEP   Time 4   Period Weeks   Status Achieved     PT SHORT TERM GOAL #2   Title Dynamic gait index improved </= 20 points  Time 4   Period Weeks   Status Achieved     PT SHORT TERM GOAL #3   Title pain in right ankle and foot decreased >/= 25%   Time 4   Period Weeks   Status Achieved           PT Long Term Goals - 11/21/15 1019      PT LONG TERM GOAL #1   Title independent with HEP   Time 8   Period Weeks   Status On-going     PT LONG TERM GOAL #2   Title walk for 1 mile feeling steady due to right leg strength >/= 4+/5 and no whipping of right forefoot during stance phase of gait   Time 8   Period Weeks   Status On-going     PT LONG TERM GOAL #3   Title going down steps without difficulty with step over step pattern due to right knee extension strength 5/5   Time 8   Period Weeks   Status On-going     PT LONG TERM GOAL #4   Title Dynamic Gait assessment >/= 22 points due to increased balance   Time 8   Period Weeks   Status On-going     PT LONG TERM GOAL #5   Title stand on one foot for 5-10 seconds to improve stance phase of gait   Time 8   Period Weeks    Status On-going               Plan - 11/21/15 1043    Clinical Impression Statement Pt continues to lack some ankle stability in Rt knee. Able to complete all exercises well with no increase in pain. Needing some verbal cues for gait pattern. Pt continue to compensate with hip internal rotation but is able to fix with cues. Pt will continue to benedfit from skilled therapy for LE strengthening and stability.       Patient will benefit from skilled therapeutic intervention in order to improve the following deficits and impairments:     Visit Diagnosis: Muscle weakness (generalized)  Other abnormalities of gait and mobility     Problem List Patient Active Problem List   Diagnosis Date Noted  . Sinus bradycardia 10/06/2015  . 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 Dyspnea and respiratory abnormality 02/25/2014  . Ataxia 08/12/2013  . Peripheral edema 08/12/2013  . Meningioma ([place]) 04/29/2013  . Hearing loss 03/23/2013  . Hemoptysis 02/23/2013  . PVC's (premature ventricular contractions) 07/15/2011  . Coronary artery calcification seen on CAT scan 03/28/2011  . Lung nodule 12/31/2010  . Chronic cough 12/31/2010  . Hypokalemia 12/05/2010  . Irregular heart rate 12/05/2010  . Insomnia, unspecified 02/16/2009  . CHEST WALL PAIN, ACUTE 12/30/2008  . [place] DISEASE, CERVICAL 03/24/2007  . GOUT 07/11/2006  . Essential hypertension 07/11/2006  . ALLERGIC RHINITIS 07/11/2006  . GERD 07/11/2006    Mikle Bosworth PTA 11/21/2015, 10:58 AM  [facility] [address], [place], [place], [address] Phone: [phone]   Fax:  [phone]  Name: Jennifer Green MRN: [id number] Date of Birth: 1940-02-22

## 2015-11-23 ENCOUNTER — Encounter: Payer: Self-pay | Admitting: Physical Therapy

## 2015-11-23 ENCOUNTER — Ambulatory Visit: Payer: Medicare Other | Admitting: Physical Therapy

## 2015-11-23 DIAGNOSIS — R2689 Other abnormalities of gait and mobility: Secondary | ICD-10-CM

## 2015-11-23 DIAGNOSIS — M6281 Muscle weakness (generalized): Secondary | ICD-10-CM

## 2015-11-23 NOTE — Patient Instructions (Addendum)
With Support    Stand on one leg in neutral spine holding support. Hold _10___ seconds. Repeat on other leg. Do __5_ repetitions, __1__ sets.  http://bt.exer.us/34   Copyright  VHI. All rights reserved.  Heel Raises    Stand with support. Tighten pelvic floor and hold. Stand right foot. With knee straight, raise right  heels off ground.  Repeat _30__ times. Do _1__ times a day. Then do on the left leg. Going slowly Copyright  VHI. All rights reserved.  Fall Prevention and Home Safety Falls cause injuries and can affect all age groups. It is possible to use preventive measures to significantly decrease the likelihood of falls. There are many simple measures which can make your home safer and prevent falls. OUTDOORS  Repair cracks and edges of walkways and driveways.  Remove high doorway thresholds.  Trim shrubbery on the main path into your home.  Have good outside lighting.  Clear walkways of tools, rocks, debris, and clutter.  Check that handrails are not broken and are securely fastened. Both sides of steps should have handrails.  Have leaves, snow, and ice cleared regularly.  Use sand or salt on walkways during winter months.  In the garage, clean up grease or oil spills. BATHROOM  Install night lights.  Install grab bars by the toilet and in the tub and shower.  Use non-skid mats or decals in the tub or shower.  Place a plastic non-slip stool in the shower to sit on, if needed.  Keep floors dry and clean up all water on the floor immediately.  Remove soap buildup in the tub or shower on a regular basis.  Secure bath mats with non-slip, double-sided rug tape.  Remove throw rugs and tripping hazards from the floors. BEDROOMS  Install night lights.  Make sure a bedside light is easy to reach.  Do not use oversized bedding.  Keep a telephone by your bedside.  Have a firm chair with side arms to use for getting dressed.  Remove throw rugs and  tripping hazards from the floor. KITCHEN  Keep handles on pots and pans turned toward the center of the stove. Use back burners when possible.  Clean up spills quickly and allow time for drying.  Avoid walking on wet floors.  Avoid hot utensils and knives.  Position shelves so they are not too high or low.  Place commonly used objects within easy reach.  If necessary, use a sturdy step stool with a grab bar when reaching.  Keep electrical cables out of the way.  Do not use floor polish or wax that makes floors slippery. If you must use wax, use non-skid floor wax.  Remove throw rugs and tripping hazards from the floor. STAIRWAYS  Never leave objects on stairs.  Place handrails on both sides of stairways and use them. Fix any loose handrails. Make sure handrails on both sides of the stairways are as long as the stairs.  Check carpeting to make sure it is firmly attached along stairs. Make repairs to worn or loose carpet promptly.  Avoid placing throw rugs at the top or bottom of stairways, or properly secure the rug with carpet tape to prevent slippage. Get rid of throw rugs, if possible.  Have an electrician put in a light switch at the top and bottom of the stairs. OTHER FALL PREVENTION TIPS  Wear low-heel or rubber-soled shoes that are supportive and fit well. Wear closed toe shoes.  When using a stepladder, make sure it is  fully opened and both spreaders are firmly locked. Do not climb a closed stepladder.  Add color or contrast paint or tape to grab bars and handrails in your home. Place contrasting color strips on first and last steps.  Learn and use mobility aids as needed. Install an electrical emergency response system.  Turn on lights to avoid dark areas. Replace light bulbs that burn out immediately. Get light switches that glow.  Arrange furniture to create clear pathways. Keep furniture in the same place.  Firmly attach carpet with non-skid or double-sided  tape.  Eliminate uneven floor surfaces.  Select a carpet pattern that does not visually hide the edge of steps.  Be aware of all pets. OTHER HOME SAFETY TIPS  Set the water temperature for 120 F (48.8 C).  Keep emergency numbers on or near the telephone.  Keep smoke detectors on every level of the home and near sleeping areas.  Step-Up: Forward    Leading with right leg, bring both feet onto _6___ inch step. Return to starting position, leading with right leg. Repeat _30___ times per session. Do _1___ sessions per day. Use cane until you feel confident that you do not need it.  Copyright  VHI. All rights reserved.  Achilles / Gastroc, Standing    Stand, right foot behind, heel on floor and turned slightly out, leg straight, forward leg bent. Move hips forward. Hold 30___ seconds. Repeat _2__ times per session. Do _1__ sessions per day.  [place] [address], [place], [place] [address] Phone # [phone] Fax [phone]

## 2015-11-23 NOTE — Therapy (Signed)
[facility] [address], [place], [place], [address] Phone: [phone]   Fax:  [phone]  Physical Therapy Treatment  Patient Details  Name: Jennifer Green MRN: [id number] Date of Birth: Mar 05, 1940 Referring Provider: Dr. Merceda Elks  Encounter Date: 11/23/2015      PT End of Session - 11/23/15 1041    Visit Number 13   Date for PT Re-Evaluation 12/05/15   Authorization Type Medicare g-code 20th visit   PT Start Time 8032   PT Stop Time 1055   PT Time Calculation (min) 40 min   Activity Tolerance Patient tolerated treatment well   Behavior During Therapy [facility] for tasks assessed/performed      Past Medical History:  Diagnosis Date  . Allergy   . Elevated blood sugar    790.21  . GERD (gastroesophageal reflux disease)   . Glaucoma   . Gout   . Hypertension   . Hypokalemia   . Hypomagnesemia   . Insomnia   . Murmur   . OP (osteoporosis)   . PVC (premature ventricular contraction)     Past Surgical History:  Procedure Laterality Date  . CERVICAL FUSION  05/2010  . EYE SURGERY     right and left    There were no vitals filed for this visit.      Subjective Assessment - 11/23/15 1023    Subjective I feel good about everything   Limitations Walking   Patient Stated Goals Patient wants to walk like she used to    Currently in Pain? No/denies            [facility] PT Assessment - 11/23/15 0001      Assessment   Medical Diagnosis R26.81 Unsteady gait   Referring Provider Dr. Merceda Elks   Onset Date/Surgical Date 04/11/15   Prior Therapy on right ankle     Precautions   Precautions Other (comment)   Precaution Comments osteoporosis     Restrictions   Weight Bearing Restrictions No     Balance Screen   Has the patient fallen in the past 6 months No   Has the patient had a decrease in activity level because of a fear of falling?  No   Is the patient reluctant to leave their home  because of a fear of falling?  No     Home Ec[employer] residence     Prior Function   Level of Independence Independent   Vocation Retired     Associate Professor   Overall Cognitive Status Within Functional Limits for tasks assessed     Strength   Right Hip Flexion 5/5   Right Hip Extension 5/5   Right Hip ABduction 5/5   Left Hip Extension 5/5   Right Ankle Plantar Flexion 4/5   Left Ankle Plantar Flexion 4/5     Dynamic Gait Index   Level Surface Normal   Change in Gait Speed Normal   Gait with Horizontal Head Turns Normal   Gait with Vertical Head Turns Normal   Gait and Pivot Turn Normal   Step Over Obstacle Normal   Step Around Obstacles Normal   Steps Mild Impairment   Total Score 23                     [facility] Adult PT Treatment/Exercise - 11/23/15 0001      Knee/Hip Exercises: Standing   Forward Step Up Right;1 set;10 reps  keeping right  foot straight     Ankle Exercises: Seated   Other Seated Ankle Exercises ankle plantarflexion  30 times bil. with gray band     Ankle Exercises: Aerobic   Stationary Bike L2 8 minutes  Nu step     Ankle Exercises: Standing   Rocker Board 2 minutes  balance   Heel Raises 20 reps  bil. holding on                PT Education - 11/23/15 1040    Education provided Yes   Education Details ankle stretchs and strength   Person(s) Educated Patient   Methods Explanation;Demonstration;Handout   Comprehension Returned demonstration;Verbalized understanding          PT Short Term Goals - 11/21/15 1018      PT SHORT TERM GOAL #1   Title independent with initial HEP   Time 4   Period Weeks   Status Achieved     PT SHORT TERM GOAL #2   Title Dynamic gait index improved </= 20 points   Time 4   Period Weeks   Status Achieved     PT SHORT TERM GOAL #3   Title pain in right ankle and foot decreased >/= 25%   Time 4   Period Weeks   Status Achieved           PT Long  Term Goals - 11/23/15 1024      PT LONG TERM GOAL #1   Title independent with HEP   Time 8   Period Weeks   Status Achieved     PT LONG TERM GOAL #2   Title walk for 1 mile feeling steady due to right leg strength >/= 4+/5 and no whipping of right forefoot during stance phase of gait   Time 8   Period Weeks   Status Achieved     PT LONG TERM GOAL #3   Title going down steps without difficulty with step over step pattern due to right knee extension strength 5/5   Time 8   Period Weeks   Status Achieved     PT LONG TERM GOAL #4   Title Dynamic Gait assessment >/= 22 points due to increased balance   Time 8   Period Weeks   Status Achieved  23     PT LONG TERM GOAL #5   Title stand on one foot for 5-10 seconds to improve stance phase of gait   Time 8   Period Weeks   Status Partially Met  3 sec               Plan - 11/23/15 1041    Clinical Impression Statement Sit to stand is 10 sec.  Dynamic gait balance has improved to 23 points.  Patient LE strenght is 5/5 with exception of plantarflexion, 4/5. Patient has met all of her goals with exception of stand on one foot for 5-10 seconds.  She is able to stand for 3 seconds.  Patient needs to hold onto railing to go up and down a step.  Patient will walk with increased dorsiflexion and no whipping of the right foot. Patient is independent with HEP.    Rehab Potential Excellent   Clinical Impairments Affecting Rehab Potential None   PT Treatment/Interventions Cryotherapy;Electrical Stimulation;Iontophoresis 100m/ml Dexamethasone;Stair training;Gait training;Ultrasound;Moist Heat;Therapeutic activities;Therapeutic exercise;Balance training;Neuromuscular re-education;Patient/family education;Passive range of motion;Manual techniques;Dry needling;Energy conservation   PT Next Visit Plan Discharge to HEP   PT Home Exercise Plan Current HEP  Consulted and Agree with Plan of Care Patient      Patient will benefit from skilled  therapeutic intervention in order to improve the following deficits and impairments:  Abnormal gait, Decreased coordination, Difficulty walking, Increased muscle spasms, Pain, Decreased activity tolerance, Decreased balance, Decreased strength  Visit Diagnosis: Muscle weakness (generalized)  Other abnormalities of gait and mobility       G-Codes - 2015/12/09 1046    Functional Assessment Tool Used therapist discretion for Dynamic gait index is 10% limitation   Functional Limitation Mobility: Walking and moving around   Mobility: Walking and Moving Around Goal Status [phone]) At least 20 percent but less than 40 percent impaired, limited or restricted   Mobility: Walking and Moving Around Discharge Status [phone]) At least 1 percent but less than 20 percent impaired, limited or restricted      Problem List Patient Active Problem List   Diagnosis Date Noted  . Sinus bradycardia 10/06/2015  . 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 Dyspnea and respiratory abnormality 02/25/2014  . Ataxia 08/12/2013  . Peripheral edema 08/12/2013  . Meningioma ([place]) 04/29/2013  . Hearing loss 03/23/2013  . Hemoptysis 02/23/2013  . PVC's (premature ventricular contractions) 07/15/2011  . Coronary artery calcification seen on CAT scan 03/28/2011  . Lung nodule 12/31/2010  . Chronic cough 12/31/2010  . Hypokalemia 12/05/2010  . Irregular heart rate 12/05/2010  . Insomnia, unspecified 02/16/2009  . CHEST WALL PAIN, ACUTE 12/30/2008  . [place] DISEASE, CERVICAL 03/24/2007  . GOUT 07/11/2006  . Essential hypertension 07/11/2006  . ALLERGIC RHINITIS 07/11/2006  . GERD 07/11/2006    Earlie Counts, PT 12/09/15 10:48 AM   [facility] [address] [place], [place], [address] Phone: [phone]   Fax:  [phone]  Name: Jennifer Green MRN: [id number] Date of Birth: [birth year]  PHYSICAL THERAPY DISCHARGE SUMMARY  Visits from Start  of Care: 13  Current functional level related to goals / functional outcomes: See above.    Remaining deficits: See above   Education / Equipment: HEP Plan: Patient agrees to discharge.  Patient goals were partially met. Patient is being discharged due to meeting the stated rehab goals.  Thank you for the referral. Earlie Counts, PT 12-09-2015 10:48 AM  ?????

## 2015-12-01 ENCOUNTER — Ambulatory Visit (INDEPENDENT_AMBULATORY_CARE_PROVIDER_SITE_OTHER): Payer: Medicare Other

## 2015-12-01 VITALS — BP 130/80 | Ht 60.0 in | Wt 131.3 lb

## 2015-12-01 DIAGNOSIS — Z0000 Encounter for general adult medical examination without abnormal findings: Secondary | ICD-10-CM

## 2015-12-01 NOTE — Patient Instructions (Addendum)
Jennifer Green , Thank you for taking time to come for your Medicare Wellness Visit. I appreciate your ongoing commitment to your health goals. Please review the following plan we discussed and let me know if I can assist you in the future.   Will discuss dexa scan; may have been osteopenic;  ?? Will discuss repeat; fx ankle x 75 yo but does not know who she fx it. Just go up one day and couldn't walk on it   Will try some new exercises yoga or other  Keep up with exercises for feet  Will take at the drug store because her insurance is not paying for the flu or other vaccines in the office.  Does get it every year; will call us when you take it or let Dr. Martinique know when you come in   These are the goals we discussed: Goals    . Exercise 150 minutes per week (moderate activity)          Will investigate yoga for health  Look tai chi which helps with balance;        This is a list of the screening recommended for you and due dates:  Health Maintenance  Topic Date Due  . Flu Shot  09/12/2015  . Tetanus Vaccine  12/20/2024  . DEXA scan (bone density measurement)  Completed  . Shingles Vaccine  Completed  . Pneumonia vaccines  Completed        Fall Prevention in the Home  Falls can cause injuries. They can happen to people of all ages. There are many things you can do to make your home safe and to help prevent falls.  WHAT CAN I DO ON THE OUTSIDE OF MY HOME?  Regularly fix the edges of walkways and driveways and fix any cracks.  Remove anything that might make you trip as you walk through a door, such as a raised step or threshold.  Trim any bushes or trees on the path to your home.  Use bright outdoor lighting.  Clear any walking paths of anything that might make someone trip, such as rocks or tools.  Regularly check to see if handrails are loose or broken. Make sure that both sides of any steps have handrails.  Any raised decks and porches should have guardrails on the  edges.  Have any leaves, snow, or ice cleared regularly.  Use sand or salt on walking paths during winter.  Clean up any spills in your garage right away. This includes oil or grease spills. WHAT CAN I DO IN THE BATHROOM?   Use night lights.  Install grab bars by the toilet and in the tub and shower. Do not use towel bars as grab bars.  Use non-skid mats or decals in the tub or shower.  If you need to sit down in the shower, use a plastic, non-slip stool.  Keep the floor dry. Clean up any water that spills on the floor as soon as it happens.  Remove soap buildup in the tub or shower regularly.  Attach bath mats securely with double-sided non-slip rug tape.  Do not have throw rugs and other things on the floor that can make you trip. WHAT CAN I DO IN THE BEDROOM?  Use night lights.  Make sure that you have a light by your bed that is easy to reach.  Do not use any sheets or blankets that are too big for your bed. They should not hang down onto the floor.  Have a firm chair that has side arms. You can use this for support while you get dressed.  Do not have throw rugs and other things on the floor that can make you trip. WHAT CAN I DO IN THE KITCHEN?  Clean up any spills right away.  Avoid walking on wet floors.  Keep items that you use a lot in easy-to-reach places.  If you need to reach something above you, use a strong step stool that has a grab bar.  Keep electrical cords out of the way.  Do not use floor polish or wax that makes floors slippery. If you must use wax, use non-skid floor wax.  Do not have throw rugs and other things on the floor that can make you trip. WHAT CAN I DO WITH MY STAIRS?  Do not leave any items on the stairs.  Make sure that there are handrails on both sides of the stairs and use them. Fix handrails that are broken or loose. Make sure that handrails are as long as the stairways.  Check any carpeting to make sure that it is firmly  attached to the stairs. Fix any carpet that is loose or worn.  Avoid having throw rugs at the top or bottom of the stairs. If you do have throw rugs, attach them to the floor with carpet tape.  Make sure that you have a light switch at the top of the stairs and the bottom of the stairs. If you do not have them, ask someone to add them for you. WHAT ELSE CAN I DO TO HELP PREVENT FALLS?  Wear shoes that:  Do not have high heels.  Have rubber bottoms.  Are comfortable and fit you well.  Are closed at the toe. Do not wear sandals.  If you use a stepladder:  Make sure that it is fully opened. Do not climb a closed stepladder.  Make sure that both sides of the stepladder are locked into place.  Ask someone to hold it for you, if possible.  Clearly mark and make sure that you can see:  Any grab bars or handrails.  First and last steps.  Where the edge of each step is.  Use tools that help you move around (mobility aids) if they are needed. These include:  Canes.  Walkers.  Scooters.  Crutches.  Turn on the lights when you go into a dark area. Replace any light bulbs as soon as they burn out.  Set up your furniture so you have a clear path. Avoid moving your furniture around.  If any of your floors are uneven, fix them.  If there are any pets around you, be aware of where they are.  Review your medicines with your doctor. Some medicines can make you feel dizzy. This can increase your chance of falling. Ask your doctor what other things that you can do to help prevent falls.   This information is not intended to replace advice given to you by your health care provider. Make sure you discuss any questions you have with your health care provider.   Document Released: 11/24/2008 Document Revised: 06/14/2014 Document Reviewed: 03/04/2014 Elsevier Interactive Patient Education 2016 [place] Maintenance, Female Adopting a healthy lifestyle and getting  preventive care can go a long way to promote health and wellness. Talk with your health care provider about what schedule of regular examinations is right for you. This is a good chance for you to check in with your provider about  disease prevention and staying healthy. In between checkups, there are plenty of things you can do on your own. Experts have done a lot of research about which lifestyle changes and preventive measures are most likely to keep you healthy. Ask your health care provider for more information. WEIGHT AND DIET  Eat a healthy diet  Be sure to include plenty of vegetables, fruits, low-fat dairy products, and lean protein.  Do not eat a lot of foods high in solid fats, added sugars, or salt.  Get regular exercise. This is one of the most important things you can do for your health.  Most adults should exercise for at least 150 minutes each week. The exercise should increase your heart rate and make you sweat (moderate-intensity exercise).  Most adults should also do strengthening exercises at least twice a week. This is in addition to the moderate-intensity exercise.  Maintain a healthy weight  Body mass index (BMI) is a measurement that can be used to identify possible weight problems. It estimates body fat based on height and weight. Your health care provider can help determine your BMI and help you achieve or maintain a healthy weight.  For females 54 years of age and older:   A BMI below 18.5 is considered underweight.  A BMI of 18.5 to 24.9 is normal.  A BMI of 25 to 29.9 is considered overweight.  A BMI of 30 and above is considered obese.  Watch levels of cholesterol and blood lipids  You should start having your blood tested for lipids and cholesterol at 77 years of age, then have this test every 5 years.  You may need to have your cholesterol levels checked more often if:  Your lipid or cholesterol levels are high.  You are older than 77 years of  age.  You are at high risk for heart disease.  CANCER SCREENING   Lung Cancer  Lung cancer screening is recommended for adults 11-33 years old who are at high risk for lung cancer because of a history of smoking.  A yearly low-dose CT scan of the lungs is recommended for people who:  Currently smoke.  Have quit within the past 15 years.  Have at least a 30-pack-year history of smoking. A pack year is smoking an average of one pack of cigarettes a day for 1 year.  Yearly screening should continue until it has been 15 years since you quit.  Yearly screening should stop if you develop a health problem that would prevent you from having lung cancer treatment.  Breast Cancer  Practice breast self-awareness. This means understanding how your breasts normally appear and feel.  It also means doing regular breast self-exams. Let your health care provider know about any changes, no matter how small.  If you are in your 20s or 30s, you should have a clinical breast exam (CBE) by a health care provider every 1-3 years as part of a regular health exam.  If you are 25 or older, have a CBE every year. Also consider having a breast X-ray (mammogram) every year.  If you have a family history of breast cancer, talk to your health care provider about genetic screening.  If you are at high risk for breast cancer, talk to your health care provider about having an MRI and a mammogram every year.  Breast cancer gene (BRCA) assessment is recommended for women who have family members with BRCA-related cancers. BRCA-related cancers include:  Breast.  Ovarian.  Tubal.  Peritoneal cancers.  Results of the assessment will determine the need for genetic counseling and BRCA1 and BRCA2 testing. Cervical Cancer Your health care provider may recommend that you be screened regularly for cancer of the pelvic organs (ovaries, uterus, and vagina). This screening involves a pelvic examination, including  checking for microscopic changes to the surface of your cervix (Pap test). You may be encouraged to have this screening done every 3 years, beginning at age 62.  For women ages 60-65, health care providers may recommend pelvic exams and Pap testing every 3 years, or they may recommend the Pap and pelvic exam, combined with testing for human papilloma virus (HPV), every 5 years. Some types of HPV increase your risk of cervical cancer. Testing for HPV may also be done on women of any age with unclear Pap test results.  Other health care providers may not recommend any screening for nonpregnant women who are considered low risk for pelvic cancer and who do not have symptoms. Ask your health care provider if a screening pelvic exam is right for you.  If you have had past treatment for cervical cancer or a condition that could lead to cancer, you need Pap tests and screening for cancer for at least 20 years after your treatment. If Pap tests have been discontinued, your risk factors (such as having a new sexual partner) need to be reassessed to determine if screening should resume. Some women have medical problems that increase the chance of getting cervical cancer. In these cases, your health care provider may recommend more frequent screening and Pap tests. Colorectal Cancer  This type of cancer can be detected and often prevented.  Routine colorectal cancer screening usually begins at 77 years of age and continues through 77 years of age.  Your health care provider may recommend screening at an earlier age if you have risk factors for colon cancer.  Your health care provider may also recommend using home test kits to check for hidden blood in the stool.  A small camera at the end of a tube can be used to examine your colon directly (sigmoidoscopy or colonoscopy). This is done to check for the earliest forms of colorectal cancer.  Routine screening usually begins at age 41.  Direct examination of  the colon should be repeated every 5-10 years through 77 years of age. However, you may need to be screened more often if early forms of precancerous polyps or small growths are found. Skin Cancer  Check your skin from head to toe regularly.  Tell your health care provider about any new moles or changes in moles, especially if there is a change in a mole's shape or color.  Also tell your health care provider if you have a mole that is larger than the size of a pencil eraser.  Always use sunscreen. Apply sunscreen liberally and repeatedly throughout the day.  Protect yourself by wearing long sleeves, pants, a wide-brimmed hat, and sunglasses whenever you are outside. HEART DISEASE, DIABETES, AND HIGH BLOOD PRESSURE   High blood pressure causes heart disease and increases the risk of stroke. High blood pressure is more likely to develop in:  People who have blood pressure in the high end of the normal range (130-139/85-89 mm Hg).  People who are overweight or obese.  People who are African American.  If you are 73-36 years of age, have your blood pressure checked every 3-5 years. If you are 41 years of age or older, have your blood pressure checked every year.  You should have your blood pressure measured twice--once when you are at a hospital or clinic, and once when you are not at a hospital or clinic. Record the average of the two measurements. To check your blood pressure when you are not at a hospital or clinic, you can use:  An automated blood pressure machine at a pharmacy.  A home blood pressure monitor.  If you are between 29 years and 75 years old, ask your health care provider if you should take aspirin to prevent strokes.  Have regular diabetes screenings. This involves taking a blood sample to check your fasting blood sugar level.  If you are at a normal weight and have a low risk for diabetes, have this test once every three years after 77 years of age.  If you are  overweight and have a high risk for diabetes, consider being tested at a younger age or more often. PREVENTING INFECTION  Hepatitis B  If you have a higher risk for hepatitis B, you should be screened for this virus. You are considered at high risk for hepatitis B if:  You were born in a country where hepatitis B is common. Ask your health care provider which countries are considered high risk.  Your parents were born in a high-risk country, and you have not been immunized against hepatitis B (hepatitis B vaccine).  You have HIV or AIDS.  You use needles to inject street drugs.  You live with someone who has hepatitis B.  You have had sex with someone who has hepatitis B.  You get hemodialysis treatment.  You take certain medicines for conditions, including cancer, organ transplantation, and autoimmune conditions. Hepatitis C  Blood testing is recommended for:  Everyone born from [birth year] through 1967.  Anyone with known risk factors for hepatitis C. Sexually transmitted infections (STIs)  You should be screened for sexually transmitted infections (STIs) including gonorrhea and chlamydia if:  You are sexually active and are younger than 77 years of age.  You are older than 77 years of age and your health care provider tells you that you are at risk for this type of infection.  Your sexual activity has changed since you were last screened and you are at an increased risk for chlamydia or gonorrhea. Ask your health care provider if you are at risk.  If you do not have HIV, but are at risk, it may be recommended that you take a prescription medicine daily to prevent HIV infection. This is called pre-exposure prophylaxis (PrEP). You are considered at risk if:  You are sexually active and do not regularly use condoms or know the HIV status of your partner(s).  You take drugs by injection.  You are sexually active with a partner who has HIV. Talk with your health care provider  about whether you are at high risk of being infected with HIV. If you choose to begin PrEP, you should first be tested for HIV. You should then be tested every 3 months for as long as you are taking PrEP.  PREGNANCY   If you are premenopausal and you may become pregnant, ask your health care provider about preconception counseling.  If you may become pregnant, take 400 to 800 micrograms (mcg) of folic acid every day.  If you want to prevent pregnancy, talk to your health care provider about birth control (contraception). OSTEOPOROSIS AND MENOPAUSE   Osteoporosis is a disease in which the bones lose minerals and strength with aging. This can result  in serious bone fractures. Your risk for osteoporosis can be identified using a bone density scan.  If you are 62 years of age or older, or if you are at risk for osteoporosis and fractures, ask your health care provider if you should be screened.  Ask your health care provider whether you should take a calcium or vitamin D supplement to lower your risk for osteoporosis.  Menopause may have certain physical symptoms and risks.  Hormone replacement therapy may reduce some of these symptoms and risks. Talk to your health care provider about whether hormone replacement therapy is right for you.  HOME CARE INSTRUCTIONS   Schedule regular health, dental, and eye exams.  Stay current with your immunizations.   Do not use any tobacco products including cigarettes, chewing tobacco, or electronic cigarettes.  If you are pregnant, do not drink alcohol.  If you are breastfeeding, limit how much and how often you drink alcohol.  Limit alcohol intake to no more than 1 drink per day for nonpregnant women. One drink equals 12 ounces of beer, 5 ounces of wine, or 1 ounces of hard liquor.  Do not use street drugs.  Do not share needles.  Ask your health care provider for help if you need support or information about quitting drugs.  Tell your  health care provider if you often feel depressed.  Tell your health care provider if you have ever been abused or do not feel safe at home.   This information is not intended to replace advice given to you by your health care provider. Make sure you discuss any questions you have with your health care provider.   Document Released: 08/13/2010 Document Revised: 02/18/2014 Document Reviewed: 12/30/2012 Elsevier Interactive Patient Education [employer].
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
## 2015-12-04 NOTE — Progress Notes (Signed)
I have reviewed documentation from this visit and I agree with recommendations given.  Jesslyn Viglione G. Telisha Zawadzki, MD  [facility]. [place] office.   

## 2015-12-07 ENCOUNTER — Ambulatory Visit (INDEPENDENT_AMBULATORY_CARE_PROVIDER_SITE_OTHER): Payer: Medicare Other | Admitting: Family Medicine

## 2015-12-07 ENCOUNTER — Encounter: Payer: Self-pay | Admitting: Family Medicine

## 2015-12-07 VITALS — BP 128/80 | HR 68 | Resp 12 | Ht 60.0 in | Wt 133.1 lb

## 2015-12-07 DIAGNOSIS — G4700 Insomnia, unspecified: Secondary | ICD-10-CM

## 2015-12-07 DIAGNOSIS — I2510 Atherosclerotic heart disease of native coronary artery without angina pectoris: Secondary | ICD-10-CM | POA: Diagnosis not present

## 2015-12-07 DIAGNOSIS — R001 Bradycardia, unspecified: Secondary | ICD-10-CM | POA: Diagnosis not present

## 2015-12-07 DIAGNOSIS — I10 Essential (primary) hypertension: Secondary | ICD-10-CM | POA: Diagnosis not present

## 2015-12-07 DIAGNOSIS — Z780 Asymptomatic menopausal state: Secondary | ICD-10-CM | POA: Diagnosis not present

## 2015-12-07 LAB — BASIC METABOLIC PANEL
BUN: 22 mg/dL (ref 6–23)
CALCIUM: 10.2 mg/dL (ref 8.4–10.5)
CO2: 32 meq/L (ref 19–32)
CREATININE: 0.95 mg/dL (ref 0.40–1.20)
Chloride: 103 mEq/L (ref 96–112)
GFR: 60.52 mL/min (ref >60.00)
Glucose, Bld: 125 mg/dL — ABNORMAL HIGH (ref 70–99)
Potassium: 4.6 mEq/L (ref 3.5–5.1)
SODIUM: 142 meq/L (ref 135–145)

## 2015-12-07 MED ORDER — TRAZODONE HCL 50 MG PO TABS: ORAL_TABLET | ORAL | 1 refills | Status: DC

## 2015-12-07 MED ORDER — ATENOLOL 25 MG PO TABS: ORAL_TABLET | ORAL | 2 refills | Status: DC

## 2015-12-07 NOTE — Progress Notes (Signed)
Pre visit review using our clinic review tool, if applicable. No additional management support is needed unless otherwise documented below in the visit note. 

## 2015-12-07 NOTE — Progress Notes (Signed)
HPI:   Jennifer Green is a 75 y.o. female, who is here today to follow on last OV,10/06/15   Last office visit she was complaining of balance problems and dizziness for 3-4 years.  She has a long history of vertigo(10-15 years), in the past she has been evaluated by ENT, dizziness she was describing last OV was different to dizziness she experiences with vertical.  PT completed, felt better , she has exercises to do at home.  In general she feels like PT helped greatly with balance/dizziness, according to patient, she is "walking better." She denies any recent fall.  She was also requesting referral to audiologist for hearing loss, She has not arranged appointment.  Insomnia: Melatonin did not help, took it for 3 weeks. Tried Tazodone 100 mg instead 50 mg and it works better , 8-9 hours of sleep and feels rested.    Hypertension:   Currently on atenolol 12.5 mg daily, dose was decreased from 25 mg daily because bradycardia.   She is also on Amlodipine 5 mg daily.  No side effects reported.  She has not noted unusual headache, visual changes, exertional chest pain, dyspnea,  focal weakness, or edema.   Lab Results  Component Value Date   CREATININE 0.90 05/11/2015   BUN 20 05/11/2015   NA 139 05/11/2015   K 4.1 05/11/2015   CL 103 05/11/2015   CO2 27 05/11/2015   She is checking BP at home, one twice per week, BP <= 140/80's and has seen some HR in the upper 50s but most > 60/min.  New concerns today: No  She is due for DEXA, last one in 2008 that showed osteopenia. Currently she is taking Ca++ and vitamin D supplementation.    Review of Systems  Constitutional: Negative for activity change, appetite change, fatigue, fever and unexpected weight change.  HENT: Negative for mouth sores, nosebleeds and trouble swallowing.   Eyes: Negative for redness and visual disturbance.  Respiratory: Negative for cough, shortness of breath and wheezing.     Cardiovascular: Negative for chest pain, palpitations and leg swelling.  Gastrointestinal: Negative for abdominal pain, nausea and vomiting.       Negative for changes in bowel habits.  Genitourinary: Negative for decreased urine volume and hematuria.  Neurological: Negative for seizures, syncope, weakness, numbness and headaches.  Psychiatric/Behavioral: Positive for sleep disturbance. Negative for confusion. The patient is not nervous/anxious.       Current Outpatient Prescriptions on File Prior to Visit  Medication Sig Dispense Refill  . allopurinol (ZYLOPRIM) 300 MG tablet Take 1 tablet by mouth  daily 90 tablet 3  . amLODipine (NORVASC) 5 MG tablet Take 1 tablet (5 mg total) by mouth daily. 100 tablet 3  . apraclonidine (IOPIDINE) 0.5 % ophthalmic solution     . Calcium Carb-Cholecalciferol [phone] MG-UNIT CAPS Take by mouth.    . Calcium Carbonate-Vitamin D (CALCIUM + D PO) Take 1 tablet by mouth daily.     . dorzolamide-timolol (COSOPT) 22.3-6.8 MG/ML ophthalmic solution Place 1 drop into both eyes 2 (two) times daily.    [name] latanoprost (XALATAN) 0.005 % ophthalmic solution Place 1 drop into both eyes daily.     . Magnesium 400 MG CAPS Take 400 mg by mouth every other day.    . Multiple Vitamins-Iron (MULTIVITAMINS WITH IRON) TABS Take 1 tablet by mouth daily.      . Multiple Vitamins-Minerals (PRESERVISION AREDS 2 PO) Take by mouth.    [name]  pilocarpine (PILOCAR) 4 % ophthalmic solution Place 1 drop into both eyes 2 (two) times daily.    . ranitidine (ZANTAC) 300 MG tablet Take 1/2 tablet by mouth at  bedtime 90 tablet 3   No current facility-administered medications on file prior to visit.      Past Medical History:  Diagnosis Date  . Allergy   . Elevated blood sugar    790.21  . GERD (gastroesophageal reflux disease)   . Glaucoma   . Gout   . Hypertension   . Hypokalemia   . Hypomagnesemia   . Insomnia   . Murmur   . OP (osteoporosis)   . PVC (premature ventricular  contraction)    Allergies  Allergen Reactions  . Indomethacin     Rapid Heart Beat  . Promethazine Hcl Other (See Comments)    Couldn't stay still    Social History   Social History  . Marital status: Married    Spouse name: N/A  . Number of children: 2  . Years of education: N/A   Occupational History  . retired     Optometrist   Social History Main Topics  . Smoking status: Never Smoker  . Smokeless tobacco: None  . Alcohol use No  . Drug use: No  . Sexual activity: No   Other Topics Concern  . None   Social History Narrative  . None    Vitals:   12/07/15 0954  BP: 128/80  Pulse: 68  Resp: 12   Body mass index is 26 kg/m.    Physical Exam  Nursing note and vitals reviewed. Constitutional: She is oriented to person, place, and time. She appears well-developed and well-nourished. No distress.  HENT:  Head: Atraumatic.  Mouth/Throat: Oropharynx is clear and moist and mucous membranes are normal.  Eyes: Conjunctivae and EOM are normal. Pupils are equal, round, and reactive to light.  Neck: No tracheal deviation present. No thyroid mass and no thyromegaly present.  Cardiovascular: Normal rate and regular rhythm.   No murmur heard. Pulses:      Dorsalis pedis pulses are 2+ on the right side, and 2+ on the left side.  Respiratory: Effort normal and breath sounds normal. No respiratory distress.  GI: Soft. She exhibits no mass. There is no hepatomegaly. There is no tenderness.  Musculoskeletal: She exhibits edema (trace pitting edema LE bilateral).  Neurological: She is alert and oriented to person, place, and time. She has normal strength. Coordination normal.  Stable gait with no assistance.  Skin: Skin is warm. No erythema.  Psychiatric: She has a normal mood and affect.  Well groomed, good eye contact.      ASSESSMENT AND PLAN:     Jersi was seen today for follow-up.  Diagnoses and all orders for this visit:    Insomnia, unspecified  type  Continue trazodone 100 mg at bedtime as needed. Good sleep hygiene. Some side effects of medication discussed. Follow-up in 5 months.  -     traZODone (DESYREL) 50 MG tablet; Take 1-2 tablet by mouth at  bedtime as needed  Essential hypertension  Adequately controlled. No changes in current management. Low salt diet recommended.  Periodic eye exam. F/U in 5 months, before if needed.  -     atenolol (TENORMIN) 25 MG tablet; Take 1/2 tablet by mouth  daily -     Basic Metabolic Panel  Sinus bradycardia  Improved. For now no changes on atenolol dose. Instructed to monitor HR at home, and limiting  no if it is frequently under 60/min. Instructed about warning signs.   Asymptomatic postmenopausal estrogen deficiency  Precautions discussed. Continue PT exercises at home. Further recommendations will be given according to DEXA results. -     DG Bone Density; Future   -She will arrange appointment with audiologists for hearing loss. -Planning on getting Flu vaccine at [facility] in the next couple weeks, due to insurance she cannot have it here (per pt report).   -Ms. WENDELL RODEZNO was advised to return sooner than planned today if new concerns arise.       Aramis Weil G. Martinique, MD  [facility]. [place] office.

## 2015-12-07 NOTE — Patient Instructions (Addendum)
A few things to remember from today's visit:   Essential hypertension  Sinus bradycardia  Insomnia, unspecified type - Plan: traZODone (DESYREL) 50 MG tablet  Fall precautions. Trazodone to continue 100 mg at bedtime.  Blood pressure goal for most people is less than goal is less than 150/90.Pulse 60/min.    Elevated blood pressure increases the risk of strokes, heart and kidney disease, and eye problems. Regular physical activity and a healthy diet (DASH diet) usually help. Low salt diet. Take medications as instructed. Caution with some over the counter medications as cold medications, dietary products (for weight loss), and Ibuprofen or Aleve (frequent use);all these medications could cause elevation of blood pressure.   Please be sure medication list is accurate. If a new problem present, please set up appointment sooner than planned today.

## 2015-12-08 ENCOUNTER — Telehealth: Payer: Self-pay | Admitting: Family Medicine

## 2015-12-08 DIAGNOSIS — H9193 Unspecified hearing loss, bilateral: Secondary | ICD-10-CM

## 2015-12-08 NOTE — Telephone Encounter (Signed)
° °  Pt call to say that her referral was sent to [facility]. Pt said she would like to go to the [facility] on [facility] number [phone]   Phone number  [phone]

## 2015-12-08 NOTE — Telephone Encounter (Signed)
New referral put in to go to [facility] on [facility].

## 2015-12-11 ENCOUNTER — Encounter (INDEPENDENT_AMBULATORY_CARE_PROVIDER_SITE_OTHER): Payer: Self-pay | Admitting: Orthopedic Surgery

## 2015-12-11 ENCOUNTER — Ambulatory Visit (INDEPENDENT_AMBULATORY_CARE_PROVIDER_SITE_OTHER): Payer: Medicare Other | Admitting: Orthopedic Surgery

## 2015-12-11 VITALS — Ht 60.0 in | Wt 131.0 lb

## 2015-12-11 DIAGNOSIS — M7751 Other enthesopathy of right foot: Secondary | ICD-10-CM

## 2015-12-11 DIAGNOSIS — I2510 Atherosclerotic heart disease of native coronary artery without angina pectoris: Secondary | ICD-10-CM

## 2015-12-11 DIAGNOSIS — M6701 Short Achilles tendon (acquired), right ankle: Secondary | ICD-10-CM | POA: Diagnosis not present

## 2015-12-11 DIAGNOSIS — M76821 Posterior tibial tendinitis, right leg: Secondary | ICD-10-CM

## 2015-12-11 DIAGNOSIS — M25871 Other specified joint disorders, right ankle and foot: Secondary | ICD-10-CM

## 2015-12-11 NOTE — Progress Notes (Signed)
Office Visit Note   Patient: Jennifer Green           Date of Birth: 10-27-1940           MRN: [id number] Visit Date: 12/11/2015              Requested by: Betty G Martinique, [address] [place], [place] [address] PCP: Betty Martinique, MD   Assessment & Plan: Visit Diagnoses:  1. Ankle impingement syndrome, right   2. Short Achilles tendon (acquired), right ankle   3. Posterior tibial tendinitis, right leg     Plan: Plan for right ankle arthroscopy. Patient will also be provided a posterior tibial tendon brace to protect the posterior tibial tendon and she was also given instructions and demonstrated heel cord stretching to do 5 times a day minute of time this was demonstrated to her. Plan to follow-up in the office 2 weeks postoperatively.  Follow-Up Instructions: Return for Follow-up 2 weeks postoperative.   Orders:  No orders of the defined types were placed in this encounter.  No orders of the defined types were placed in this encounter.     Procedures: No procedures performed   Clinical Data: No additional findings.   Subjective: Chief Complaint  Patient presents with  . Right Ankle - Pain    Pt is s/p a right ankle injection on 11/16/15 for effusion of the ankle and pain of the subtalar joint that was thought to be gout. The patient's uric acid was drawn that day with a result on 3.1 She was already on a dose of Allopurinol 300mg  per day.  Pt states that injection worked for about 3 days and then then same s/s returned.     Review of Systems   Objective: Vital Signs: Ht 5' (1.524 m)   Wt 131 lb (59.4 kg)   BMI 25.58 kg/m   Physical Exam Patient is alert oriented no adenopathy well-dressed normal affect normal respiratory effort.    Patient does have an antalgic gait she has heel cord tightness of dorsiflexion about 10 short of neutral with her knee extended. She has good pulses. She has tenderness to palpation along the posterior tibial tendon  resisted inversion is painful she is tender to palpation anteriorly over the ankle and a sinus Tarsi is minimally tender at this time. She does have some metatarsalgia pain and callus beneath the metatarsal head secondary to heel cord tightness. Patient had good interval relief for a short period after the ankle injection. Patient states she's been symptomatic for several years. She has undergone prolonged conservative therapy including immobilization in a fracture boot. Ortho Exam  Specialty Comments:  No specialty comments available.  Imaging: No results found.   PMFS History: Patient Active Problem List   Diagnosis Date Noted  . Sinus bradycardia 10/06/2015  . 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 Dyspnea and respiratory abnormality 02/25/2014  . Ataxia 08/12/2013  . Peripheral edema 08/12/2013  . Meningioma ([place]) 04/29/2013  . Hearing loss 03/23/2013  . Hemoptysis 02/23/2013  . PVC's (premature ventricular contractions) 07/15/2011  . Coronary artery calcification seen on CAT scan 03/28/2011  . Lung nodule 12/31/2010  . Chronic cough 12/31/2010  . Hypokalemia 12/05/2010  . Irregular heart rate 12/05/2010  . Insomnia 02/16/2009  . CHEST WALL PAIN, ACUTE 12/30/2008  . 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 GOUT 07/11/2006  . Essential hypertension 07/11/2006  . ALLERGIC RHINITIS 07/11/2006  . GERD 07/11/2006   Past Medical History:  Diagnosis  Date  . Allergy   . Elevated blood sugar    790.21  . GERD (gastroesophageal reflux disease)   . Glaucoma   . Gout   . Hypertension   . Hypokalemia   . Hypomagnesemia   . Insomnia   . Murmur   . OP (osteoporosis)   . PVC (premature ventricular contraction)     Family History  Problem Relation Age of Onset  . Pneumonia Mother   . Hypertension Mother   . Heart disease Father     Past Surgical History:  Procedure Laterality Date  . CERVICAL FUSION  05/2010  . EYE SURGERY     right and left   Social History    Occupational History  . retired     Optometrist   Social History Main Topics  . Smoking status: Never Smoker  . Smokeless tobacco: Not on file  . Alcohol use No  . Drug use: No  . Sexual activity: No

## 2015-12-19 DIAGNOSIS — M19171 Post-traumatic osteoarthritis, right ankle and foot: Secondary | ICD-10-CM | POA: Diagnosis not present

## 2016-01-02 ENCOUNTER — Other Ambulatory Visit: Payer: Self-pay | Admitting: Family Medicine

## 2016-01-02 ENCOUNTER — Ambulatory Visit (INDEPENDENT_AMBULATORY_CARE_PROVIDER_SITE_OTHER): Payer: Medicare Other | Admitting: Orthopedic Surgery

## 2016-01-02 DIAGNOSIS — M25571 Pain in right ankle and joints of right foot: Secondary | ICD-10-CM | POA: Insufficient documentation

## 2016-01-02 NOTE — Progress Notes (Signed)
Office Visit Note   Patient: Jennifer Green           Date of Birth: [birth year]           MRN: [id number] Visit Date: 01/02/2016              Requested by: Betty G Martinique, MD [address] [place], [place] [address] PCP: Betty Martinique, MD   Assessment & Plan: Visit Diagnoses: No diagnosis found.  Plan: Sutures were harvested. She will continue her compression stockings and elevation for swelling. Activities as tolerated. Follow-up in 2 more weeks.  Follow-Up Instructions: No Follow-up on file.   Orders:  No orders of the defined types were placed in this encounter.  No orders of the defined types were placed in this encounter.     Procedures: No procedures performed   Clinical Data: No additional findings.   Subjective: No chief complaint on file.   Patient here today 1st op appointment from a right ankle scope on 12/19/15. Patient states she is having pain and swelling. Is wearing compression stockings. Stops to rest and elevate the right lower extremity which helps the swelling. Pain has improved since last week. Her stitches are intact. Taking vicodin.     Review of Systems  Constitutional: Negative for chills and fever.  Musculoskeletal: Positive for arthralgias, gait problem and joint swelling.  All other systems reviewed and are negative.    Objective: Vital Signs: There were no vitals taken for this visit.  Physical Exam  Right Ankle Exam  Swelling: moderate  Tenderness  The patient is experiencing no tenderness.  Range of Motion  The patient has normal right ankle ROM.  Tests  Anterior drawer: negative Varus tilt: negative  Other  Pulse: present       Specialty Comments:  No specialty comments available.  Imaging: No results found.   PMFS History: Patient Active Problem List   Diagnosis Date Noted  . Sinus bradycardia 10/06/2015  . 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 Dyspnea and respiratory abnormality  02/25/2014  . Ataxia 08/12/2013  . Peripheral edema 08/12/2013  . Meningioma ([place]) 04/29/2013  . Hearing loss 03/23/2013  . Hemoptysis 02/23/2013  . PVC's (premature ventricular contractions) 07/15/2011  . Coronary artery calcification seen on CAT scan 03/28/2011  . Lung nodule 12/31/2010  . Chronic cough 12/31/2010  . Hypokalemia 12/05/2010  . Irregular heart rate 12/05/2010  . Insomnia 02/16/2009  . CHEST WALL PAIN, ACUTE 12/30/2008  . [place] DISEASE, CERVICAL 03/24/2007  . GOUT 07/11/2006  . Essential hypertension 07/11/2006  . ALLERGIC RHINITIS 07/11/2006  . GERD 07/11/2006   Past Medical History:  Diagnosis Date  . Allergy   . Elevated blood sugar    790.21  . GERD (gastroesophageal reflux disease)   . Glaucoma   . Gout   . Hypertension   . Hypokalemia   . Hypomagnesemia   . Insomnia   . Murmur   . OP (osteoporosis)   . PVC (premature ventricular contraction)     Family History  Problem Relation Age of Onset  . Pneumonia Mother   . Hypertension Mother   . Heart disease Father     Past Surgical History:  Procedure Laterality Date  . CERVICAL FUSION  05/2010  . EYE SURGERY     right and left   Social History   Occupational History  . retired     Optometrist   Social History Main Topics  . Smoking status: Never Smoker  .  Smokeless tobacco: Not on file  . Alcohol use No  . Drug use: No  . Sexual activity: No

## 2016-01-16 ENCOUNTER — Ambulatory Visit (INDEPENDENT_AMBULATORY_CARE_PROVIDER_SITE_OTHER): Payer: Medicare Other | Admitting: Orthopedic Surgery

## 2016-01-16 ENCOUNTER — Other Ambulatory Visit: Payer: Self-pay | Admitting: Family Medicine

## 2016-01-16 DIAGNOSIS — M76821 Posterior tibial tendinitis, right leg: Secondary | ICD-10-CM | POA: Insufficient documentation

## 2016-01-16 DIAGNOSIS — I2510 Atherosclerotic heart disease of native coronary artery without angina pectoris: Secondary | ICD-10-CM

## 2016-01-16 NOTE — Progress Notes (Signed)
Office Visit Note   Patient: Jennifer Green           Date of Birth: Oct 05, 1940           MRN: [id number] Visit Date: 01/16/2016              Requested by: Betty G Martinique, MD [address] [place], [place] [address] PCP: Betty Martinique, MD   Assessment & Plan: Visit Diagnoses:  1. Posterior tibial tendinitis, right leg     Plan: Patient is showing good improvement with range of motion of her ankle and lengthening of the Achilles with stretching. She still is symptomatic over the posterior tibial tendon her impingement symptoms are resolving well. Patient was given instructions to continue with her heel cord stretching she is to resume using her ankle stabilizing orthosis to help with the posterior tibial tendinitis follow-up in 4 weeks  Follow-Up Instructions: Return in about 4 weeks (around 02/13/2016).   Orders:  No orders of the defined types were placed in this encounter.  No orders of the defined types were placed in this encounter.     Procedures: No procedures performed   Clinical Data: No additional findings.   Subjective: Chief Complaint  Patient presents with  . Right Ankle - Routine Post Op    12/23/15 right ankle scope    Patient states that she is " coming along" does take Ibuprofen and Tylenol prn pain and that she is working on ROM and exercises at home.  She states that other than some on and off pain that she does not have any questions today. Patient is not wearing a compressive sock and is in regular slide on shoe wear.     Review of Systems   Objective: Vital Signs: There were no vitals taken for this visit.  Physical Exam examination she is alert oriented no adenopathy well-dressed normal affect normal respiratory effort she does have a slight antalgic gait she has good pulses she does have some swelling anteriorly over the ankle she has minimal tenderness to palpation she has dorsiflexion about 20 past neutral and is showing improvement  with her dorsiflexion. She still has some tenderness to palpation along the posterior tibial tendon. There is no redness no cellulitis no calf tenderness no signs of DVT  Ortho Exam  Specialty Comments:  No specialty comments available.  Imaging: No results found.   PMFS History: Patient Active Problem List   Diagnosis Date Noted  . Posterior tibial tendinitis, right leg 01/16/2016  . Pain in right ankle and joints of right foot 01/02/2016  . Sinus bradycardia 10/06/2015  . 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 Dyspnea and respiratory abnormality 02/25/2014  . Ataxia 08/12/2013  . Peripheral edema 08/12/2013  . Meningioma ([place]) 04/29/2013  . Hearing loss 03/23/2013  . Hemoptysis 02/23/2013  . PVC's (premature ventricular contractions) 07/15/2011  . Coronary artery calcification seen on CAT scan 03/28/2011  . Lung nodule 12/31/2010  . Chronic cough 12/31/2010  . Hypokalemia 12/05/2010  . Irregular heart rate 12/05/2010  . Insomnia 02/16/2009  . CHEST WALL PAIN, ACUTE 12/30/2008  . [place] DISEASE, CERVICAL 03/24/2007  . GOUT 07/11/2006  . Essential hypertension 07/11/2006  . ALLERGIC RHINITIS 07/11/2006  . GERD 07/11/2006   Past Medical History:  Diagnosis Date  . Allergy   . Elevated blood sugar    790.21  . GERD (gastroesophageal reflux disease)   . Glaucoma   . Gout   . Hypertension   . Hypokalemia   .  Hypomagnesemia   . Insomnia   . Murmur   . OP (osteoporosis)   . PVC (premature ventricular contraction)     Family History  Problem Relation Age of Onset  . Pneumonia Mother   . Hypertension Mother   . Heart disease Father     Past Surgical History:  Procedure Laterality Date  . CERVICAL FUSION  05/2010  . EYE SURGERY     right and left   Social History   Occupational History  . retired     Optometrist   Social History Main Topics  . Smoking status: Never Smoker  . Smokeless tobacco: Not on file  . Alcohol use No  . Drug use: No  .  Sexual activity: No

## 2016-01-30 ENCOUNTER — Other Ambulatory Visit: Payer: Self-pay | Admitting: Family Medicine

## 2016-01-30 DIAGNOSIS — M109 Gout, unspecified: Secondary | ICD-10-CM

## 2016-02-13 ENCOUNTER — Encounter (INDEPENDENT_AMBULATORY_CARE_PROVIDER_SITE_OTHER): Payer: Self-pay

## 2016-02-13 ENCOUNTER — Ambulatory Visit (INDEPENDENT_AMBULATORY_CARE_PROVIDER_SITE_OTHER): Payer: Medicare Other | Admitting: Orthopedic Surgery

## 2016-02-13 DIAGNOSIS — M6701 Short Achilles tendon (acquired), right ankle: Secondary | ICD-10-CM

## 2016-02-13 DIAGNOSIS — M76821 Posterior tibial tendinitis, right leg: Secondary | ICD-10-CM

## 2016-02-13 MED ORDER — NITROGLYCERIN 0.2 MG/HR TD PT24: 0.2000 mg | MEDICATED_PATCH | Freq: Every day | TRANSDERMAL | 12 refills | Status: DC

## 2016-02-13 NOTE — Progress Notes (Signed)
Office Visit Note   Patient: Jennifer Green           Date of Birth: [birth year]           MRN: [id number] Visit Date: 02/13/2016              Requested by: Betty G Martinique, MD [address] [place], [place] [address] PCP: Betty Martinique, MD   Assessment & Plan: Visit Diagnoses:  1. Posterior tibial tendinitis, right leg   2. Achilles tendon contracture, right     Plan: Patient was given instructions for heel cord stretching for her Achilles contracture she will use a nitroglycerin patch over the posterior tibial tendon change location daily follow-up in 4 weeks. Discussed the surgical treatment options for the posterior tibial tendon insufficiency and patient states she is not interested in surgery.  Follow-Up Instructions: Return in about 4 weeks (around 03/12/2016).   Orders:  No orders of the defined types were placed in this encounter.  No orders of the defined types were placed in this encounter.     Procedures: No procedures performed   Clinical Data: No additional findings.   Subjective: No chief complaint on file.   Patient presents in follow-up with persistent posterior tibial tendinitis pain. She states she's tried ankle stabilizing orthosis without relief. She cannot tolerate wearing it. She complains of pain medially and laterally as well as some numbness beneath her fifth metatarsal head.    Review of Systems   Objective: Vital Signs: There were no vitals taken for this visit.  Physical Exam on examination patient is alert oriented no adenopathy well-dressed normal affect almost ref which has a normal gait. She is a good dorsalis pedis pulse she does have heel cord contracture with dorsiflexion only to neutral. She has tenderness to palpation beneath the fifth metatarsal head secondary to her Achilles contracture she has no tenderness to palpation along the peroneal tendons she has no tenderness to palpation along the posterior tibial tendon and can  do a heel raise with a little bit of discomfort on the right.  Ortho Exam  Specialty Comments:  No specialty comments available.  Imaging: No results found.   PMFS History: Patient Active Problem List   Diagnosis Date Noted  . Achilles tendon contracture, right 02/13/2016  . Posterior tibial tendinitis, right leg 01/16/2016  . Pain in right ankle and joints of right foot 01/02/2016  . Sinus bradycardia 10/06/2015  . 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 Dyspnea and respiratory abnormality 02/25/2014  . Ataxia 08/12/2013  . Peripheral edema 08/12/2013  . Meningioma ([place]) 04/29/2013  . Hearing loss 03/23/2013  . Hemoptysis 02/23/2013  . PVC's (premature ventricular contractions) 07/15/2011  . Coronary artery calcification seen on CAT scan 03/28/2011  . Lung nodule 12/31/2010  . Chronic cough 12/31/2010  . Hypokalemia 12/05/2010  . Irregular heart rate 12/05/2010  . Insomnia 02/16/2009  . CHEST WALL PAIN, ACUTE 12/30/2008  . [place] DISEASE, CERVICAL 03/24/2007  . GOUT 07/11/2006  . Essential hypertension 07/11/2006  . ALLERGIC RHINITIS 07/11/2006  . GERD 07/11/2006   Past Medical History:  Diagnosis Date  . Allergy   . Elevated blood sugar    790.21  . GERD (gastroesophageal reflux disease)   . Glaucoma   . Gout   . Hypertension   . Hypokalemia   . Hypomagnesemia   . Insomnia   . Murmur   . OP (osteoporosis)   . PVC (premature ventricular contraction)     Family  History  Problem Relation Age of Onset  . Pneumonia Mother   . Hypertension Mother   . Heart disease Father     Past Surgical History:  Procedure Laterality Date  . CERVICAL FUSION  05/2010  . EYE SURGERY     right and left   Social History   Occupational History  . retired     Optometrist   Social History Main Topics  . Smoking status: Never Smoker  . Smokeless tobacco: Not on file  . Alcohol use No  . Drug use: No  . Sexual activity: No

## 2016-03-12 ENCOUNTER — Ambulatory Visit (INDEPENDENT_AMBULATORY_CARE_PROVIDER_SITE_OTHER): Payer: Medicare Other | Admitting: Orthopedic Surgery

## 2016-03-12 VITALS — Ht 61.0 in | Wt 131.0 lb

## 2016-03-12 DIAGNOSIS — M76821 Posterior tibial tendinitis, right leg: Secondary | ICD-10-CM

## 2016-03-12 NOTE — Progress Notes (Signed)
Office Visit Note   Patient: Jennifer Green           Date of Birth: 09/21/1940           MRN: [id number] Visit Date: 03/12/2016              Requested by: Betty G Martinique, [address] [place], [place] [address] PCP: Betty Martinique, MD  Chief Complaint  Patient presents with  . Right Ankle - Follow-up    HPI: Patient is here for follow up for her right ankle posterior tibial tendonitis. She has been doing her heel cord stretches and that she has not been able to wear the PTTB. Patient states that she has not been successful in wearing this because it "holds her foot in one place" and her "foot does not want to stay that way". Pamella Pert, RMA  The patient is a 76 year old woman who is seen today in follow-up for right ankle posterior tibial tendinitis. This has been ongoing for many months. Most recently she began using nitroglycerin patches over the medial foot. States this has helped minimally. Pleased that she has at least started to make some progress. Has been working on heel cord stretching while seated in her recliner. States she is unable to wear the PTT brace as this is painful. States she is also have a hard time with orthotics in the past has had trouble fitting her foot in the shoe with orthotics as well as the inserts that come with her shoes.    Assessment & Plan: Visit Diagnoses:  1. Posterior tibial tendinitis, right leg     Plan: Have recommended that she obtained sole orthotics and begin wearing these daily. She will discontinue her PTT brace at this time. Continue with cord stretching daily. Will continue the nitroglycerin patches.  Follow-Up Instructions: Return in about 4 weeks (around 04/09/2016), or if symptoms worsen or fail to improve.   Ortho Exam Physical Exam  Constitutional: Appears well-developed.  Head: Normocephalic.  Eyes: EOM are normal.  Neck: Normal range of motion.  Cardiovascular: Normal rate.   Pulmonary/Chest: Effort normal.   Neurological: Is alert.  Skin: Skin is warm.  Psychiatric: Has a normal mood and affect. Right foot/ankle: Range of motion of the ankle. No pain with dorsiflexion. Does have heel cord tightness with dorsiflexion to neutral. No ligamentous tenderness. Does have tenderness over the insertion of the posterior tibial tendon. No appreciable swelling today. No discoloration. Pes planus.  Imaging: No results found.  Orders:  No orders of the defined types were placed in this encounter.  No orders of the defined types were placed in this encounter.    Procedures: No procedures performed  Clinical Data: No additional findings.  Subjective: Review of Systems  Constitutional: Negative for chills and fever.  Musculoskeletal: Positive for arthralgias. Negative for gait problem.  Skin: Negative for color change.    Objective: Vital Signs: Ht 5\' 1"  (1.549 m)   Wt 131 lb (59.4 kg)   BMI 24.75 kg/m   Specialty Comments:  No specialty comments available.  PMFS History: Patient Active Problem List   Diagnosis Date Noted  . Achilles tendon contracture, right 02/13/2016  . Posterior tibial tendinitis, right leg 01/16/2016  . Pain in right ankle and joints of right foot 01/02/2016  . Sinus bradycardia 10/06/2015  . 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 Dyspnea and respiratory abnormality 02/25/2014  . Ataxia 08/12/2013  . Peripheral edema 08/12/2013  . Meningioma ([place])  04/29/2013  . Hearing loss 03/23/2013  . Hemoptysis 02/23/2013  . PVC's (premature ventricular contractions) 07/15/2011  . Coronary artery calcification seen on CAT scan 03/28/2011  . Lung nodule 12/31/2010  . Chronic cough 12/31/2010  . Hypokalemia 12/05/2010  . Irregular heart rate 12/05/2010  . Insomnia 02/16/2009  . CHEST WALL PAIN, ACUTE 12/30/2008  . [place] DISEASE, CERVICAL 03/24/2007  . GOUT 07/11/2006  . Essential hypertension 07/11/2006  . ALLERGIC RHINITIS 07/11/2006  . GERD 07/11/2006    Past Medical History:  Diagnosis Date  . Allergy   . Elevated blood sugar    790.21  . GERD (gastroesophageal reflux disease)   . Glaucoma   . Gout   . Hypertension   . Hypokalemia   . Hypomagnesemia   . Insomnia   . Murmur   . OP (osteoporosis)   . PVC (premature ventricular contraction)     Family History  Problem Relation Age of Onset  . Pneumonia Mother   . Hypertension Mother   . Heart disease Father     Past Surgical History:  Procedure Laterality Date  . CERVICAL FUSION  05/2010  . EYE SURGERY     right and left   Social History   Occupational History  . retired     Optometrist   Social History Main Topics  . Smoking status: Never Smoker  . Smokeless tobacco: Not on file  . Alcohol use No  . Drug use: No  . Sexual activity: No

## 2016-04-01 ENCOUNTER — Other Ambulatory Visit: Payer: Self-pay | Admitting: Emergency Medicine

## 2016-04-01 MED ORDER — AMLODIPINE BESYLATE 5 MG PO TABS: 5.0000 mg | ORAL_TABLET | Freq: Every day | ORAL | 5 refills | Status: DC

## 2016-04-01 MED ORDER — ALLOPURINOL 300 MG PO TABS: 300.0000 mg | ORAL_TABLET | Freq: Every day | ORAL | 3 refills | Status: DC

## 2016-04-03 ENCOUNTER — Ambulatory Visit (INDEPENDENT_AMBULATORY_CARE_PROVIDER_SITE_OTHER): Payer: Medicare Other | Admitting: Family Medicine

## 2016-04-03 ENCOUNTER — Encounter: Payer: Self-pay | Admitting: Family Medicine

## 2016-04-03 VITALS — BP 124/80 | HR 73 | Temp 98.7°F | Resp 12 | Ht 61.0 in | Wt 129.4 lb

## 2016-04-03 DIAGNOSIS — J069 Acute upper respiratory infection, unspecified: Secondary | ICD-10-CM

## 2016-04-03 DIAGNOSIS — R6889 Other general symptoms and signs: Secondary | ICD-10-CM

## 2016-04-03 LAB — POC INFLUENZA A&B (BINAX/QUICKVUE)
INFLUENZA B, POC: NEGATIVE
Influenza A, POC: NEGATIVE

## 2016-04-03 MED ORDER — BENZONATATE 100 MG PO CAPS: 200.0000 mg | ORAL_CAPSULE | Freq: Two times a day (BID) | ORAL | 0 refills | Status: AC | PRN

## 2016-04-03 MED ORDER — BENZONATATE 100 MG PO CAPS: 200.0000 mg | ORAL_CAPSULE | Freq: Two times a day (BID) | ORAL | 0 refills | Status: DC | PRN

## 2016-04-03 NOTE — Progress Notes (Signed)
HPI:   ACUTE VISIT:  Chief Complaint  Patient presents with  . flu like symptoms    [name] is a 76 y.o. female, who is here today complaining of 4 days of respiratory symptoms. Nonproductive cough and fever started on 03/30/16 night. Last time she noted fever was yesterday, max 100.2 F.  She denies chest pain, dyspnea, or wheezing. + Nasal congestion, rhinorrhea, sore throat, and post nasal drainage. + Chills and body aches. Right earache and bilateral ear fullness sensation.  No Hx of recent travel. Sick contact:No No known insect bite. She has Hx of allergies.  Medication OTC for this problem:Nyquil and Mucinex. No medications today. Symptoms otherwise stable.   Review of Systems  Constitutional: Positive for appetite change, chills, fatigue and fever.  HENT: Positive for congestion, ear pain, postnasal drip, sore throat and voice change (mild dysphonia today). Negative for ear discharge, facial swelling, mouth sores, sinus pressure and trouble swallowing.   Eyes: Negative for discharge and redness.  Respiratory: Positive for cough. Negative for shortness of breath and wheezing.   Cardiovascular: Negative for chest pain.  Gastrointestinal: Negative for abdominal pain, diarrhea, nausea and vomiting.  Genitourinary: Negative for decreased urine volume, dysuria and hematuria.  Musculoskeletal: Positive for back pain and myalgias. Negative for joint swelling and neck pain.  Skin: Negative for rash.  Allergic/Immunologic: Positive for environmental allergies.  Neurological: Positive for headaches. Negative for syncope and weakness.  Hematological: Negative for adenopathy. Does not bruise/bleed easily.  Psychiatric/Behavioral: Negative for confusion.      Current Outpatient Prescriptions on File Prior to Visit  Medication Sig Dispense Refill  . allopurinol (ZYLOPRIM) 300 MG tablet Take 1 tablet (300 mg total) by mouth daily. 90 tablet 3  . amLODipine  (NORVASC) 5 MG tablet Take 1 tablet (5 mg total) by mouth daily. 40 tablet 5  . apraclonidine (IOPIDINE) 0.5 % ophthalmic solution     . atenolol (TENORMIN) 25 MG tablet Take 1/2 tablet by mouth  daily 90 tablet 2  . Calcium Carb-Cholecalciferol [phone] MG-UNIT CAPS Take by mouth.    . Calcium Carbonate-Vitamin D (CALCIUM + D PO) Take 1 tablet by mouth daily.     . dorzolamide-timolol (COSOPT) 22.3-6.8 MG/ML ophthalmic solution Place 1 drop into both eyes 2 (two) times daily.    [name] latanoprost (XALATAN) 0.005 % ophthalmic solution Place 1 drop into both eyes daily.     . Magnesium 400 MG CAPS Take 400 mg by mouth every other day.    . Multiple Vitamins-Iron (MULTIVITAMINS WITH IRON) TABS Take 1 tablet by mouth daily.      . Multiple Vitamins-Minerals (PRESERVISION AREDS 2 PO) Take by mouth.    . nitroGLYCERIN (NITRODUR - DOSED IN MG/24 HR) 0.2 mg/hr patch Place 1 patch (0.2 mg total) onto the skin daily. 30 patch 12  . pilocarpine (PILOCAR) 4 % ophthalmic solution Place 1 drop into both eyes 2 (two) times daily.    . ranitidine (ZANTAC) 300 MG tablet TAKE ONE TABLET BY MOUTH AT BEDTIME 90 tablet 3  . traZODone (DESYREL) 50 MG tablet Take 1-2 tablet by mouth at  bedtime as needed 180 tablet 1   No current facility-administered medications on file prior to visit.      Past Medical History:  Diagnosis Date  . Allergy   . Elevated blood sugar    790.21  . GERD (gastroesophageal reflux disease)   . Glaucoma   . Gout   . Hypertension   .  Hypokalemia   . Hypomagnesemia   . Insomnia   . Murmur   . OP (osteoporosis)   . PVC (premature ventricular contraction)    Allergies  Allergen Reactions  . Indomethacin     Rapid Heart Beat  . Promethazine Hcl Other (See Comments)    Couldn't stay still    Social History   Social History  . Marital status: Married    Spouse name: N/A  . Number of children: 2  . Years of education: N/A   Occupational History  . retired     Optometrist    Social History Main Topics  . Smoking status: Never Smoker  . Smokeless tobacco: Never Used  . Alcohol use No  . Drug use: No  . Sexual activity: No   Other Topics Concern  . None   Social History Narrative  . None    Vitals:   04/03/16 1047  BP: 124/80  Pulse: 73  Resp: 12  Temp: 98.7 F (37.1 C)  O2 sat 94% at RA. Body mass index is 24.45 kg/m.   Physical Exam  Nursing note and vitals reviewed. Constitutional: She is oriented to person, place, and time. She appears well-developed and well-nourished. She does not appear ill. No distress.  HENT:  Head: Atraumatic.  Right Ear: External ear and ear canal normal. Tympanic membrane is not erythematous. A middle ear effusion is present.  Left Ear: Tympanic membrane, external ear and ear canal normal.  Nose: Rhinorrhea present. Right sinus exhibits no maxillary sinus tenderness and no frontal sinus tenderness. Left sinus exhibits no maxillary sinus tenderness and no frontal sinus tenderness.  Mouth/Throat: Uvula is midline and mucous membranes are normal. Posterior oropharyngeal erythema (mild) present. No oropharyngeal exudate or posterior oropharyngeal edema.  Postnasal drainage. Nasal voice and mild dysphonia.   Eyes: Conjunctivae and EOM are normal.  Neck: No muscular tenderness present.  Cardiovascular: Normal rate and regular rhythm.   Respiratory: Effort normal and breath sounds normal. No stridor. No respiratory distress.  Non productive cough during OV.  Lymphadenopathy:       Head (right side): No submandibular adenopathy present.       Head (left side): No submandibular adenopathy present.    She has cervical adenopathy.       Right cervical: Posterior cervical adenopathy present.       Left cervical: Posterior cervical adenopathy present.  Neurological: She is alert and oriented to person, place, and time. She has normal strength. Coordination and gait normal.  Skin: Skin is warm. No rash noted. No  erythema.  Psychiatric: She has a normal mood and affect. Her speech is normal.  Well groomed, good eye contact.      ASSESSMENT AND PLAN:     Jennifer Green was seen today for flu like symptoms.  Diagnoses and all orders for this visit:  Flu-like symptoms -     POC Influenza A&B (Binax test)  URI, acute -     Discontinue: benzonatate (TESSALON) 100 MG capsule; Take 2 capsules (200 mg total) by mouth 2 (two) times daily as needed. -     benzonatate (TESSALON) 100 MG capsule; Take 2 capsules (200 mg total) by mouth 2 (two) times daily as needed.   Symptoms suggests a viral etiology, I explained that symptomatic treatment is usually recommended in this case.  Rapid flu test negative. Because symptoms started 3-4 days ago I am not recommending empiric treatment with Tamiflu.  Instructed to monitor for signs of complications, clearly instructed about  warning signs. Instructed to continue checking temperature, she should not have fever in 2-3 days, she is to let me know if she still does. I also explained that cough and nasal congestion can last a few days and sometimes weeks. F/U as needed.   Return if symptoms worsen or fail to improve.     -[name] was advised to return or notify a doctor immediately if symptoms worsen or new concerns arise.       Betty G. Martinique, MD  [facility]. [place] office.

## 2016-04-03 NOTE — Progress Notes (Signed)
Pre visit review using our clinic review tool, if applicable. No additional management support is needed unless otherwise documented below in the visit note. 

## 2016-04-03 NOTE — Patient Instructions (Addendum)
A few things to remember from today's visit:   Flu-like symptoms - Plan: POC Influenza A&B (Binax test)  URI, acute - Plan: benzonatate (TESSALON) 100 MG capsule  viral infections are self-limited and we treat each symptom depending of severity.  Over the counter medications as decongestants and cold medications usually help, they need to be taken with caution if there is a history of high blood pressure or palpitations. Tylenol and/or Ibuprofen also helps with most symptoms (headache, muscle aching, fever,etc) Plenty of fluids. Honey helps with cough. Steam inhalations helps with runny nose, nasal congestion, and may prevent sinus infections. Cough and nasal congestion could last a few days and sometimes weeks. Please follow in not any better in 1-2 weeks or if symptoms get worse.   In about 3-4 days please let me know through My Chart or by calling the office about symptoms, if still having fever.  Please be sure medication list is accurate.

## 2016-04-09 ENCOUNTER — Ambulatory Visit (INDEPENDENT_AMBULATORY_CARE_PROVIDER_SITE_OTHER): Payer: Medicare Other | Admitting: Orthopedic Surgery

## 2016-05-06 ENCOUNTER — Ambulatory Visit: Payer: Medicare Other | Admitting: Family Medicine

## 2016-05-16 ENCOUNTER — Other Ambulatory Visit: Payer: Self-pay

## 2016-05-16 DIAGNOSIS — G4700 Insomnia, unspecified: Secondary | ICD-10-CM

## 2016-05-16 MED ORDER — TRAZODONE HCL 50 MG PO TABS: ORAL_TABLET | ORAL | 0 refills | Status: DC

## 2016-07-23 ENCOUNTER — Other Ambulatory Visit: Payer: Self-pay | Admitting: Family Medicine

## 2016-07-23 DIAGNOSIS — G4700 Insomnia, unspecified: Secondary | ICD-10-CM

## 2016-08-27 NOTE — Progress Notes (Signed)
HPI:   [name] is a 76 y.o. female, who is here today to follow on some chronic medical problems.  She was last seen on 04/03/16 for acute visit. Last f/u visit on 12/07/15.  Since her last OV she has followed with ortho,Dr Sharol Given, for right ankle tendinitis.   Insomnia: She is currently on Trazodone 100 mg at bedtime. Medication is still helping with sleep. Sleeping about 7 hours. No side effects from medication.   Hypertension:   Dx over 10 years ago. Currently on Atenolol 12.5 mg daily and Amlodipine 5 mg daily. Atenolol was decreased from 25 mg because bradycardia,she has not checked HR at home. She states that she took 25 mg for a week around the time she had dental work because her BP was mildly elevated, back to 12.5 mg over 2 weeks ago.  Eye surgery for right eye glaucoma since her last OV.  Home BP readings: 140's/70's Following low salt diet: Yes.   She is taking medications as instructed, no side effects reported.  Denies headache, visual changes, exertional chest pain, dyspnea,  focal weakness, or edema.   Lab Results  Component Value Date   CREATININE 0.95 12/07/2015   BUN 22 12/07/2015   NA 142 12/07/2015   K 4.6 12/07/2015   CL 103 12/07/2015   CO2 32 12/07/2015   Gout: Dx a few years ago. She has not had an acute exacerbation in 1-2 years, left foot mainly. Currently she is on Allopurinol 300 mg daily. Tolerating medication well, no side effects reported.  Has had hyperglycemia in the past, no Hx of DM.   Concerns today:   Respiratory symptoms:  "Chest congestion" for 32 days. She had a "terrible infection" [place]. She had fever initially, max 102 F for 2 days, she did not seek medical attention. She feels "a little" better but still having post nasal drainage, ears fullness sensation, cough with occasional sputum. She denies hemoptysis or dyspnea. + Wheezing. Left thoracic back pain,exscerbated by movement  and cough, mild, not radiated. She has not had trauma, taking Ibuprofen.  No sick contact.    Review of Systems  Constitutional: Positive for fatigue. Negative for activity change, appetite change and fever.  HENT: Positive for postnasal drip, rhinorrhea and sore throat. Negative for facial swelling, mouth sores, nosebleeds, sinus pressure, sneezing, trouble swallowing and voice change.   Eyes: Negative for redness and visual disturbance.  Respiratory: Positive for cough and wheezing. Negative for shortness of breath.   Cardiovascular: Negative for chest pain, palpitations and leg swelling.  Gastrointestinal: Negative for abdominal pain, diarrhea, nausea and vomiting.  Endocrine: Negative for cold intolerance, heat intolerance, polydipsia, polyphagia and polyuria.  Genitourinary: Negative for decreased urine volume and hematuria.  Musculoskeletal: Positive for back pain. Negative for gait problem and neck pain.  Skin: Negative for rash.  Allergic/Immunologic: Positive for environmental allergies.  Neurological: Negative for syncope, weakness and headaches.  Hematological: Negative for adenopathy. Does not bruise/bleed easily.  Psychiatric/Behavioral: Negative for confusion. The patient is nervous/anxious.       Current Outpatient Prescriptions on File Prior to Visit  Medication Sig Dispense Refill  . allopurinol (ZYLOPRIM) 300 MG tablet Take 1 tablet (300 mg total) by mouth daily. 90 tablet 3  . amLODipine (NORVASC) 5 MG tablet TAKE 1 TABLET BY MOUTH  DAILY 30 tablet 0  . apraclonidine (IOPIDINE) 0.5 % ophthalmic solution     . atenolol (TENORMIN) 25 MG tablet Take 1/2  tablet by mouth  daily 90 tablet 2  . Calcium Carbonate-Vitamin D (CALCIUM + D PO) Take 1 tablet by mouth daily.     . dorzolamide-timolol (COSOPT) 22.3-6.8 MG/ML ophthalmic solution Place 1 drop into both eyes 2 (two) times daily.    [name] latanoprost (XALATAN) 0.005 % ophthalmic solution Place 1 drop into both eyes  daily.     . Magnesium 400 MG CAPS Take 400 mg by mouth every other day.    . Multiple Vitamins-Iron (MULTIVITAMINS WITH IRON) TABS Take 1 tablet by mouth daily.      . Multiple Vitamins-Minerals (PRESERVISION AREDS 2 PO) Take by mouth.    . nitroGLYCERIN (NITRODUR - DOSED IN MG/24 HR) 0.2 mg/hr patch Place 1 patch (0.2 mg total) onto the skin daily. 30 patch 12  . pilocarpine (PILOCAR) 4 % ophthalmic solution Place 1 drop into both eyes 2 (two) times daily.    . ranitidine (ZANTAC) 300 MG tablet TAKE ONE TABLET BY MOUTH AT BEDTIME 90 tablet 3  . traZODone (DESYREL) 50 MG tablet TAKE 1 TO 2 TABLETS BY  MOUTH AT BEDTIME AS NEEDED 180 tablet 1   No current facility-administered medications on file prior to visit.      Past Medical History:  Diagnosis Date  . Allergy   . Elevated blood sugar    790.21  . GERD (gastroesophageal reflux disease)   . Glaucoma   . Gout   . Hypertension   . Hypokalemia   . Hypomagnesemia   . Insomnia   . Murmur   . OP (osteoporosis)   . PVC (premature ventricular contraction)    Allergies  Allergen Reactions  . Indomethacin     Rapid Heart Beat  . Promethazine Hcl Other (See Comments)    Couldn't stay still    Social History   Social History  . Marital status: Married    Spouse name: N/A  . Number of children: 2  . Years of education: N/A   Occupational History  . retired     Optometrist   Social History Main Topics  . Smoking status: Never Smoker  . Smokeless tobacco: Never Used  . Alcohol use No  . Drug use: No  . Sexual activity: No   Other Topics Concern  . None   Social History Narrative  . None    Vitals:   08/28/16 0936  BP: 120/78  Pulse: 71  Resp: 12  O2 sat at RA 95% Body mass index is 24 kg/m.   Physical Exam  Nursing note and vitals reviewed. Constitutional: She is oriented to person, place, and time. She appears well-developed and well-nourished. She does not appear ill. No distress.  HENT:  Head:  Atraumatic.  Right Ear: Tympanic membrane, external ear and ear canal normal.  Left Ear: Tympanic membrane, external ear and ear canal normal.  Nose: Rhinorrhea present. Right sinus exhibits no maxillary sinus tenderness and no frontal sinus tenderness. Left sinus exhibits no maxillary sinus tenderness and no frontal sinus tenderness.  Mouth/Throat: Oropharynx is clear and moist and mucous membranes are normal.  Eyes: Conjunctivae are normal.  Neck: No muscular tenderness present. No edema and no erythema present.  Cardiovascular: Normal rate and regular rhythm.   No murmur heard. Pulses:      Dorsalis pedis pulses are 2+ on the right side, and 2+ on the left side.  Respiratory: Effort normal. No respiratory distress. She has wheezes (R>L). She has no rales.  Mild course respiratory sounds left base.  GI: Soft. She exhibits no mass. There is no hepatomegaly. There is no tenderness.  Musculoskeletal: She exhibits no edema.       Thoracic back: She exhibits no tenderness and no bony tenderness.  Lymphadenopathy:    She has no cervical adenopathy.  Neurological: She is alert and oriented to person, place, and time. She has normal strength. Gait normal.  Skin: Skin is warm. No rash noted. No erythema.  Psychiatric: Her mood appears anxious.  Well groomed, good eye contact.    ASSESSMENT AND PLAN:   Ms. Kyrene was seen today for follow-up.  Diagnoses and all orders for this visit:  Lab Results  Component Value Date   WBC 4.8 08/28/2016   HGB 13.6 08/28/2016   HCT 40.2 08/28/2016   MCV 97.3 08/28/2016   PLT 271.0 08/28/2016   Lab Results  Component Value Date   CREATININE 0.85 08/28/2016   BUN 16 08/28/2016   NA 139 08/28/2016   K 4.3 08/28/2016   CL 102 08/28/2016   CO2 28 08/28/2016   Lab Results  Component Value Date   LABURIC 3.5 08/28/2016     Respiratory tract infection  Symptoms could be residual after URI, because persistency abx was recommended today. CXR  ordered. Side effects of Doxycycline discussed. Instructed about warning signs. F/U as needed.  -     doxycycline (VIBRA-TABS) 100 MG tablet; Take 1 tablet (100 mg total) by mouth 2 (two) times daily. -     predniSONE (DELTASONE) 20 MG tablet; Take 2 tablets (40 mg total) by mouth daily with breakfast. -     albuterol (PROVENTIL HFA;VENTOLIN HFA) 108 (90 Base) MCG/ACT inhaler; Inhale 2 puffs into the lungs every 6 (six) hours as needed for wheezing or shortness of breath.  Insomnia, unspecified type  Well controlled. No changes in current management, some side effects of Trazodone reviewed. Good sleep hygiene. F/U in 6 months.  Reactive airway disease that is not asthma  She has taken Prednisone before and tolerated well, some side effects discussed. Albuterol inh 2 puff every 6 hours for a week then as needed for wheezing or shortness of breath.  Instructed about warning signs.  -     predniSONE (DELTASONE) 20 MG tablet; Take 2 tablets (40 mg total) by mouth daily with breakfast. -     albuterol (PROVENTIL HFA;VENTOLIN HFA) 108 (90 Base) MCG/ACT inhaler; Inhale 2 puffs into the lungs every 6 (six) hours as needed for wheezing or shortness of breath.  Chronic gout without tophus, unspecified cause, unspecified site  No changes in current management, will follow labs done today and will give further recommendations accordingly. F/U in 6-12 months.  -     Uric acid  Essential hypertension  Adequately controlled. No changes in current management. DASH and low salt diet to continue. Eye exam current. F/U in 6 months, before if needed.  -     Basic metabolic panel  Cough, persistent  Explained that cough after respiratory illness can last a few weeks and even months sometimes. Further recommendations will be given according to labs/imaging results.  -     CBC -     DG Chest 2 View; Future  Hyperglycemia  Continue primary prevention through healthy life style. Further  recommendations will be given according to lab results.  -     Hemoglobin A1c      -Ms. EVEREST HACKING was advised to return sooner than planned today if new concerns arise.  Mayar Whittier G. Martinique, MD  [facility]. [place] office.

## 2016-08-28 ENCOUNTER — Ambulatory Visit (INDEPENDENT_AMBULATORY_CARE_PROVIDER_SITE_OTHER): Payer: Medicare Other | Admitting: Family Medicine

## 2016-08-28 ENCOUNTER — Encounter: Payer: Self-pay | Admitting: Family Medicine

## 2016-08-28 ENCOUNTER — Ambulatory Visit (INDEPENDENT_AMBULATORY_CARE_PROVIDER_SITE_OTHER)
Admission: RE | Admit: 2016-08-28 | Discharge: 2016-08-28 | Disposition: A | Discharge status: Home / Self Care | Payer: Medicare Other | Source: Ambulatory Visit | Attending: Family Medicine | Admitting: Family Medicine

## 2016-08-28 VITALS — BP 120/78 | HR 71 | Resp 12 | Ht 61.0 in | Wt 127.0 lb

## 2016-08-28 DIAGNOSIS — R739 Hyperglycemia, unspecified: Secondary | ICD-10-CM | POA: Diagnosis not present

## 2016-08-28 DIAGNOSIS — R05 Cough: Secondary | ICD-10-CM | POA: Diagnosis not present

## 2016-08-28 DIAGNOSIS — I10 Essential (primary) hypertension: Secondary | ICD-10-CM

## 2016-08-28 DIAGNOSIS — G4700 Insomnia, unspecified: Secondary | ICD-10-CM

## 2016-08-28 DIAGNOSIS — J988 Other specified respiratory disorders: Secondary | ICD-10-CM

## 2016-08-28 DIAGNOSIS — R0989 Other specified symptoms and signs involving the circulatory and respiratory systems: Secondary | ICD-10-CM | POA: Diagnosis not present

## 2016-08-28 DIAGNOSIS — J989 Respiratory disorder, unspecified: Secondary | ICD-10-CM

## 2016-08-28 DIAGNOSIS — R053 Chronic cough: Secondary | ICD-10-CM

## 2016-08-28 DIAGNOSIS — M1A9XX0 Chronic gout, unspecified, without tophus (tophi): Secondary | ICD-10-CM | POA: Diagnosis not present

## 2016-08-28 LAB — CBC
HEMATOCRIT: 40.2 % (ref 36.0–46.0)
HEMOGLOBIN: 13.6 g/dL (ref 12.0–15.0)
MCHC: 34 g/dL (ref 30.0–36.0)
MCV: 97.3 fl (ref 78.0–100.0)
PLATELETS: 271 10*3/uL (ref 150.0–400.0)
RBC: 4.13 Mil/uL (ref 3.87–5.11)
RDW: 13.8 % (ref 11.5–15.5)
WBC: 4.8 10*3/uL (ref 4.0–10.5)

## 2016-08-28 LAB — BASIC METABOLIC PANEL
BUN: 16 mg/dL (ref 6–23)
CHLORIDE: 102 meq/L (ref 96–112)
CO2: 28 meq/L (ref 19–32)
CREATININE: 0.85 mg/dL (ref 0.40–1.20)
Calcium: 9.4 mg/dL (ref 8.4–10.5)
GFR: 68.68 mL/min (ref >60.00)
GLUCOSE: 108 mg/dL — AB (ref 70–99)
POTASSIUM: 4.3 meq/L (ref 3.5–5.1)
Sodium: 139 mEq/L (ref 135–145)

## 2016-08-28 LAB — HEMOGLOBIN A1C: Hgb A1c MFr Bld: 6.3 % (ref 4.6–6.5)

## 2016-08-28 LAB — URIC ACID: Uric Acid, Serum: 3.5 mg/dL (ref 2.4–7.0)

## 2016-08-28 MED ORDER — DOXYCYCLINE HYCLATE 100 MG PO TABS: 100.0000 mg | ORAL_TABLET | Freq: Two times a day (BID) | ORAL | 0 refills | Status: AC

## 2016-08-28 MED ORDER — PREDNISONE 20 MG PO TABS: 40.0000 mg | ORAL_TABLET | Freq: Every day | ORAL | 0 refills | Status: AC

## 2016-08-28 MED ORDER — ALBUTEROL SULFATE HFA 108 (90 BASE) MCG/ACT IN AERS: 2.0000 | INHALATION_SPRAY | Freq: Four times a day (QID) | RESPIRATORY_TRACT | 0 refills | Status: DC | PRN

## 2016-08-28 NOTE — Patient Instructions (Signed)
A few things to remember from today's visit:   Insomnia, unspecified type  Chronic gout without tophus, unspecified cause, unspecified site - Plan: Uric acid  Essential hypertension - Plan: Basic metabolic panel  Respiratory tract infection - Plan: doxycycline (VIBRA-TABS) 100 MG tablet, predniSONE (DELTASONE) 20 MG tablet, albuterol (PROVENTIL HFA;VENTOLIN HFA) 108 (90 Base) MCG/ACT inhaler  Cough, persistent - Plan: CBC, DG Chest 2 View  Hyperglycemia - Plan: Hemoglobin A1c  Reactive airway disease that is not asthma - Plan: predniSONE (DELTASONE) 20 MG tablet, albuterol (PROVENTIL HFA;VENTOLIN HFA) 108 (90 Base) MCG/ACT inhaler   Today X ray was ordered.  This can be done at [facility] at [facility] between 8 am and 5 pm: [place]. [phone].   Albuterol inh 2 puff every 6 hours for a week then as needed for wheezing or shortness of breath.    Please be sure medication list is accurate. If a new problem present, please set up appointment sooner than planned today.

## 2016-09-17 ENCOUNTER — Other Ambulatory Visit: Payer: Self-pay | Admitting: Family Medicine

## 2016-09-17 DIAGNOSIS — Z1231 Encounter for screening mammogram for malignant neoplasm of breast: Secondary | ICD-10-CM

## 2016-09-24 ENCOUNTER — Ambulatory Visit
Admission: RE | Admit: 2016-09-24 | Discharge: 2016-09-24 | Disposition: A | Discharge status: Home / Self Care | Payer: Medicare Other | Source: Ambulatory Visit | Attending: Family Medicine | Admitting: Family Medicine

## 2016-09-24 DIAGNOSIS — Z1231 Encounter for screening mammogram for malignant neoplasm of breast: Secondary | ICD-10-CM

## 2016-10-04 ENCOUNTER — Other Ambulatory Visit: Payer: Self-pay | Admitting: Otolaryngology

## 2016-10-04 DIAGNOSIS — H908 Mixed conductive and sensorineural hearing loss, unspecified: Secondary | ICD-10-CM

## 2016-10-09 ENCOUNTER — Ambulatory Visit
Admission: RE | Admit: 2016-10-09 | Discharge: 2016-10-09 | Disposition: A | Discharge status: Home / Self Care | Payer: Medicare Other | Source: Ambulatory Visit | Attending: Otolaryngology | Admitting: Otolaryngology

## 2016-10-09 DIAGNOSIS — H908 Mixed conductive and sensorineural hearing loss, unspecified: Secondary | ICD-10-CM

## 2016-10-09 MED ORDER — IOPAMIDOL (ISOVUE-300) INJECTION 61%
75.0000 mL | Freq: Once | INTRAVENOUS | Status: AC | PRN
  Administered 2016-10-09: 75 mL via INTRAVENOUS

## 2016-10-16 ENCOUNTER — Other Ambulatory Visit: Payer: Self-pay | Admitting: Family Medicine

## 2016-10-16 DIAGNOSIS — K219 Gastro-esophageal reflux disease without esophagitis: Secondary | ICD-10-CM

## 2016-10-16 DIAGNOSIS — I10 Essential (primary) hypertension: Secondary | ICD-10-CM

## 2016-11-20 NOTE — Progress Notes (Signed)
Jennifer Green Date of Birth: Mar 08, 1940 Medical Record #[id number]  History of Present Illness: Jennifer Green is seen today for followup of PVCs. Extensive evaluation in October 2012 including echocardiogram and stress Myoview study were normal. She did have coronary calcification noted on chest CT in 2/13. No history of angina.  She states she is doing very well from a cardiac standpoint. She has rare palpitations. She states she has occasional pain in her mid back that radiates around her shoulder and into her upper chest. This is not associated with activity. Relieved with Ibuprofen. She states she is very active.  Current Outpatient Prescriptions on File Prior to Visit  Medication Sig Dispense Refill  . albuterol (PROVENTIL HFA;VENTOLIN HFA) 108 (90 Base) MCG/ACT inhaler Inhale 2 puffs into the lungs every 6 (six) hours as needed for wheezing or shortness of breath. 1 Inhaler 0  . allopurinol (ZYLOPRIM) 300 MG tablet Take 1 tablet (300 mg total) by mouth daily. 90 tablet 3  . amLODipine (NORVASC) 5 MG tablet TAKE 1 TABLET BY MOUTH  DAILY 90 tablet 1  . apraclonidine (IOPIDINE) 0.5 % ophthalmic solution     . atenolol (TENORMIN) 25 MG tablet Take 1/2 tablet by mouth  daily 90 tablet 2  . atenolol (TENORMIN) 25 MG tablet TAKE ONE-HALF TABLET BY  MOUTH DAILY 45 tablet 1  . Calcium Carbonate-Vitamin D (CALCIUM + D PO) Take 1 tablet by mouth daily.     . dorzolamide-timolol (COSOPT) 22.3-6.8 MG/ML ophthalmic solution Place 1 drop into both eyes 2 (two) times daily.    [name] latanoprost (XALATAN) 0.005 % ophthalmic solution Place 1 drop into both eyes daily.     . Magnesium 400 MG CAPS Take 400 mg by mouth every other day.    . Multiple Vitamins-Iron (MULTIVITAMINS WITH IRON) TABS Take 1 tablet by mouth daily.      . Multiple Vitamins-Minerals (PRESERVISION AREDS 2 PO) Take by mouth.    . nitroGLYCERIN (NITRODUR - DOSED IN MG/24 HR) 0.2 mg/hr patch Place 1 patch (0.2 mg total) onto the skin daily.  30 patch 12  . pilocarpine (PILOCAR) 4 % ophthalmic solution Place 1 drop into both eyes 2 (two) times daily.    . ranitidine (ZANTAC) 300 MG tablet TAKE ONE TABLET BY MOUTH AT BEDTIME 90 tablet 3  . ranitidine (ZANTAC) 300 MG tablet TAKE ONE-HALF TABLET BY  MOUTH AT BEDTIME 90 tablet 1  . traZODone (DESYREL) 50 MG tablet TAKE 1 TO 2 TABLETS BY  MOUTH AT BEDTIME AS NEEDED 180 tablet 1   No current facility-administered medications on file prior to visit.     Allergies  Allergen Reactions  . Indomethacin     Rapid Heart Beat  . Promethazine Hcl Other (See Comments)    Couldn't stay still    Past Medical History:  Diagnosis Date  . Allergy   . Elevated blood sugar    790.21  . GERD (gastroesophageal reflux disease)   . Glaucoma   . Gout   . Hypertension   . Hypokalemia   . Hypomagnesemia   . Insomnia   . Murmur   . OP (osteoporosis)   . PVC (premature ventricular contraction)     Past Surgical History:  Procedure Laterality Date  . CERVICAL FUSION  05/2010  . EYE SURGERY     right and left    History  Smoking Status  . Never Smoker  Smokeless Tobacco  . Never Used    History  Alcohol Use  No    Family History  Problem Relation Age of Onset  . Pneumonia Mother   . Hypertension Mother   . Heart disease Father     Review of Systems: As noted in history of present illness..  All other systems were reviewed and are negative.  Physical Exam: BP (!) 158/78   Pulse 68   Ht 5' (1.524 m)   Wt 126 lb 3.2 oz (57.2 kg)   BMI 24.65 kg/m   GENERAL:  Well appearing Jennifer Green in NAD HEENT:  PERRL, EOMI, sclera are clear. Oropharynx is clear. NECK:  No jugular venous distention, carotid upstroke brisk and symmetric, no bruits, no thyromegaly or adenopathy LUNGS:  Clear to auscultation bilaterally CHEST:  Unremarkable HEART:  RRR,  PMI not displaced or sustained,S1 and S2 within normal limits, no S3, no S4: no clicks, no rubs, no murmurs ABD:  Soft, nontender. BS +, no  masses or bruits. No hepatomegaly, no splenomegaly EXT:  2 + pulses throughout, no edema, no cyanosis no clubbing SKIN:  Warm and dry.  No rashes NEURO:  Alert and oriented x 3. Cranial nerves II through XII intact. PSYCH:  Cognitively intact    LABORATORY DATA:   Lab Results  Component Value Date   WBC 4.8 08/28/2016   HGB 13.6 08/28/2016   HCT 40.2 08/28/2016   PLT 271.0 08/28/2016   GLUCOSE 108 (H) 08/28/2016   CHOL 161 12/21/2014   TRIG 132.0 12/21/2014   HDL 42.50 12/21/2014   LDLDIRECT 142.1 04/25/2006   LDLCALC 92 12/21/2014   ALT 22 12/21/2014   AST 27 12/21/2014   NA 139 08/28/2016   K 4.3 08/28/2016   CL 102 08/28/2016   CREATININE 0.85 08/28/2016   BUN 16 08/28/2016   CO2 28 08/28/2016   TSH 2.44 12/21/2014   HGBA1C 6.3 08/28/2016   Ecg today shows NSR with frequent PVCs. Nonspecific TWA. I have personally reviewed and interpreted this study.   Assessment / Plan: 1. 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 Continue beta blocker therapy.   2. Coronary calcification with normal myoview study 2012. No real symptoms of angina. Recommend risk factor modification. She has not had lipid panel done in 2 years. I suggested she have this done when next seen by primary care.   Follow up in one year.

## 2016-11-22 ENCOUNTER — Ambulatory Visit (INDEPENDENT_AMBULATORY_CARE_PROVIDER_SITE_OTHER): Payer: Medicare Other | Admitting: Cardiology

## 2016-11-22 ENCOUNTER — Encounter: Payer: Self-pay | Admitting: Cardiology

## 2016-11-22 VITALS — BP 158/78 | HR 68 | Ht 60.0 in | Wt 126.2 lb

## 2016-11-22 DIAGNOSIS — I10 Essential (primary) hypertension: Secondary | ICD-10-CM | POA: Diagnosis not present

## 2016-11-22 DIAGNOSIS — I493 Ventricular premature depolarization: Secondary | ICD-10-CM | POA: Diagnosis not present

## 2016-11-22 DIAGNOSIS — I2510 Atherosclerotic heart disease of native coronary artery without angina pectoris: Secondary | ICD-10-CM

## 2016-11-22 NOTE — Patient Instructions (Signed)
Continue your current therapy  I will see you in one year   

## 2017-01-20 ENCOUNTER — Other Ambulatory Visit: Payer: Self-pay | Admitting: Family Medicine

## 2017-01-28 ENCOUNTER — Encounter: Payer: Self-pay | Admitting: Family Medicine

## 2017-01-28 ENCOUNTER — Ambulatory Visit (INDEPENDENT_AMBULATORY_CARE_PROVIDER_SITE_OTHER): Payer: Medicare Other | Admitting: Family Medicine

## 2017-01-28 ENCOUNTER — Ambulatory Visit (INDEPENDENT_AMBULATORY_CARE_PROVIDER_SITE_OTHER)
Admission: RE | Admit: 2017-01-28 | Discharge: 2017-01-28 | Disposition: A | Discharge status: Home / Self Care | Payer: Medicare Other | Source: Ambulatory Visit | Attending: Family Medicine | Admitting: Family Medicine

## 2017-01-28 VITALS — BP 140/80 | HR 68 | Temp 98.2°F | Resp 12 | Ht 60.0 in | Wt 122.4 lb

## 2017-01-28 DIAGNOSIS — I2510 Atherosclerotic heart disease of native coronary artery without angina pectoris: Secondary | ICD-10-CM

## 2017-01-28 DIAGNOSIS — R208 Other disturbances of skin sensation: Secondary | ICD-10-CM

## 2017-01-28 DIAGNOSIS — M25511 Pain in right shoulder: Secondary | ICD-10-CM | POA: Diagnosis not present

## 2017-01-28 DIAGNOSIS — M542 Cervicalgia: Secondary | ICD-10-CM

## 2017-01-28 MED ORDER — TIZANIDINE HCL 2 MG PO TABS: 2.0000 mg | ORAL_TABLET | Freq: Three times a day (TID) | ORAL | 0 refills | Status: DC | PRN

## 2017-01-28 MED ORDER — PREDNISONE 20 MG PO TABS: ORAL_TABLET | ORAL | 0 refills | Status: AC

## 2017-01-28 NOTE — Progress Notes (Signed)
ACUTE VISIT   HPI:  Chief Complaint  Patient presents with  . Neck Pain    started 2 months ago, getting worse  . Shoulder Pain    started 2 months ago, getting worse    [name] is a 76 y.o. female, who is here today complaining of 5-6 months of intermittent right upper back and cervical pain, which has been more frequent for the past 2 months. For the past 2 months pain has been daily, 10/10, sharp, radiated to right infraclavicular area on right shoulder. He is exacerbated by different chores around her house, usually she does not have problems while she is active but a few hours after and worse the next day. Alleviated by rest. If she does not engage in cleaning pain is "not as bad."   Limitation of cervical movement and difficulty reaching to pick up things from the floor. Constant burning sensation on right upper back,cervical area. She denies numbness, tingling, or upper extremity weakness. She has no noted rash or skin changes.  She denies hx of similar symptoms in the past. No history of trauma or unusual physical activity. She has tried changing her pillow twice but he has not help with the pain.  Currently she is taking Ibuprofen 200 mg 4 tablets daily. Ibuprofen is causing nausea.   Denies abdominal pain, vomiting, changes in bowel habits, or melena.   Review of Systems  Constitutional: Negative for appetite change, fatigue, fever and unexpected weight change.  HENT: Negative for mouth sores, nosebleeds, sore throat, trouble swallowing and voice change.   Respiratory: Negative for cough, shortness of breath and wheezing.   Cardiovascular: Negative for chest pain, palpitations and leg swelling.  Gastrointestinal: Negative for abdominal pain, nausea and vomiting.  Musculoskeletal: Positive for arthralgias, back pain and neck pain. Negative for gait problem and joint swelling.  Skin: Negative for pallor and rash.  Neurological: Negative for  syncope, weakness, numbness and headaches.  Hematological: Negative for adenopathy. Does not bruise/bleed easily.  Psychiatric/Behavioral: Positive for sleep disturbance. Negative for confusion. The patient is nervous/anxious.     Current Outpatient Medications on File Prior to Visit  Medication Sig Dispense Refill  . allopurinol (ZYLOPRIM) 300 MG tablet TAKE 1 TABLET BY MOUTH  DAILY 90 tablet 1  . amLODipine (NORVASC) 5 MG tablet TAKE 1 TABLET BY MOUTH  DAILY 90 tablet 1  . apraclonidine (IOPIDINE) 0.5 % ophthalmic solution     . atenolol (TENORMIN) 25 MG tablet Take 1/2 tablet by mouth  daily 90 tablet 2  . Calcium Carbonate-Vitamin D (CALCIUM + D PO) Take 1 tablet by mouth daily.     . dorzolamide-timolol (COSOPT) 22.3-6.8 MG/ML ophthalmic solution Place 1 drop into both eyes 2 (two) times daily.    [name] latanoprost (XALATAN) 0.005 % ophthalmic solution Place 1 drop into both eyes daily.     . Magnesium 400 MG CAPS Take 400 mg by mouth every other day.    . Multiple Vitamins-Iron (MULTIVITAMINS WITH IRON) TABS Take 1 tablet by mouth daily.      . pilocarpine (PILOCAR) 4 % ophthalmic solution Place 1 drop into both eyes 2 (two) times daily.    . ranitidine (ZANTAC) 300 MG tablet TAKE ONE TABLET BY MOUTH AT BEDTIME 90 tablet 3  . traZODone (DESYREL) 50 MG tablet TAKE 1 TO 2 TABLETS BY  MOUTH AT BEDTIME AS NEEDED 180 tablet 1  . albuterol (PROVENTIL HFA;VENTOLIN HFA) 108 (90 Base) MCG/ACT inhaler Inhale  2 puffs into the lungs every 6 (six) hours as needed for wheezing or shortness of breath. 1 Inhaler 0   No current facility-administered medications on file prior to visit.      Past Medical History:  Diagnosis Date  . Allergy   . Elevated blood sugar    790.21  . GERD (gastroesophageal reflux disease)   . Glaucoma   . Gout   . Hypertension   . Hypokalemia   . Hypomagnesemia   . Insomnia   . Murmur   . OP (osteoporosis)   . PVC (premature ventricular contraction)    Allergies    Allergen Reactions  . Indomethacin     Rapid Heart Beat  . Promethazine Hcl Other (See Comments)    Couldn't stay still    Social History   Socioeconomic History  . Marital status: Married    Spouse name: None  . Number of children: 2  . Years of education: None  . Highest education level: None  Social Needs  . Financial resource strain: None  . Food insecurity - worry: None  . Food insecurity - inability: None  . Transportation needs - medical: None  . Transportation needs - non-medical: None  Occupational History  . Occupation: retired    Comment: Optometrist  Tobacco Use  . Smoking status: Never Smoker  . Smokeless tobacco: Never Used  Substance and Sexual Activity  . Alcohol use: No  . Drug use: No  . Sexual activity: No  Other Topics Concern  . None  Social History Narrative  . None    Vitals:   01/28/17 0903  BP: 140/80  Pulse: 68  Resp: 12  Temp: 98.2 F (36.8 C)  SpO2: 97%   Body mass index is 23.9 kg/m.   Physical Exam  Nursing note and vitals reviewed. Constitutional: She is oriented to person, place, and time. She appears well-developed and well-nourished. She does not appear ill. No distress.  HENT:  Head: Normocephalic and atraumatic.  Eyes: Conjunctivae are normal.  Cardiovascular: Normal rate and regular rhythm.  No murmur heard. Pulses:      Radial pulses are 2+ on the right side, and 2+ on the left side.  Respiratory: Effort normal and breath sounds normal. No respiratory distress.  Musculoskeletal: She exhibits no edema.       Right shoulder: She exhibits normal range of motion, no tenderness and no bony tenderness.       Cervical back: She exhibits decreased range of motion, tenderness and spasm. She exhibits no bony tenderness.  Normal ROM of shoulders, movement elicits pain. No significant deformity appreciated. Tenderness upon palpation of right cervical paraspinal muscles and right trapezium. Limitation of cervical spine ROM,  mainly left side rotation.Movement elicits pain.  Also pain upon palpation of infra clavicular area and sternoclavicular joint.  No local edema or erythema appreciated, no suspicious lesions.    Lymphadenopathy:    She has no cervical adenopathy.  Neurological: She is alert and oriented to person, place, and time. She has normal strength. Coordination and gait normal.  Skin: Skin is warm. No rash noted. No erythema.  Psychiatric: Her mood appears anxious.  Well groomed, good eye contact.     ASSESSMENT AND PLAN:   [name] was seen today for neck pain and shoulder pain.  Diagnoses and all orders for this visit:  Cervical pain  We discussed possible causes, musculoskeletal most likely. Because no prior Hx and getting worse, cervical X ray ordered today. Local massage  and ROM exercises recommended. Side effects of muscle relaxants discussed. Instructed about warning signs. PT will be arranged.   -     tiZANidine (ZANAFLEX) 2 MG tablet; Take 1 tablet (2 mg total) by mouth every 8 (eight) hours as needed for muscle spasms. -     Ambulatory referral to Physical Therapy -     DG Cervical Spine Complete; Future  Burning sensation of skin  ? Radicular pain. After discussion of some side effects of Prednisone, she agrees to try a taper dose. Avoid NSAID's while taking this medication due to risk of GI complications. If not resolved in 4-6 weeks cervical MRI and/or ortho referral may be necessary.  -     predniSONE (DELTASONE) 20 MG tablet; 3 tabs for 3 days, 2 tabs for 3 days, 1 tabs for 3 days, and 1/2 tab for 3 days. Take tables together with breakfast.  Sternoclavicular joint pain, right  Most likely OA. Instructed about warning signs. Tylenol 500 mg tid as needed and/or OTC Icy Hot topical. F/U as needed.     -[name] was advised to seek immediate medical attention if sudden worsening symptoms or to follow if they persist or if new concerns  arise.       Nathaly Dawkins G. Martin[place], MD  [facility]. [place] office.

## 2017-01-28 NOTE — Patient Instructions (Signed)
A few things to remember from today's visit:   Cervical pain - Plan: tiZANidine (ZANAFLEX) 2 MG tablet, Ambulatory referral to Physical Therapy, DG Cervical Spine Complete  Burning sensation of skin - Plan: predniSONE (DELTASONE) 20 MG tablet  Prednisone with food. NO Ibuprofen or Aleve.  Today X ray was ordered.  This can be done at [facility] between 8 am and 5 pm: [place]. [phone].  Range of motion exercises and massage.   Please be sure medication list is accurate. If a new problem present, please set up appointment sooner than planned today.

## 2017-01-30 ENCOUNTER — Ambulatory Visit: Payer: Medicare Other | Attending: Family Medicine | Admitting: Physical Therapy

## 2017-01-30 ENCOUNTER — Encounter: Payer: Self-pay | Admitting: Physical Therapy

## 2017-01-30 DIAGNOSIS — M25511 Pain in right shoulder: Secondary | ICD-10-CM | POA: Diagnosis present

## 2017-01-30 DIAGNOSIS — R293 Abnormal posture: Secondary | ICD-10-CM | POA: Insufficient documentation

## 2017-01-30 DIAGNOSIS — M542 Cervicalgia: Secondary | ICD-10-CM | POA: Diagnosis not present

## 2017-01-30 DIAGNOSIS — M62830 Muscle spasm of back: Secondary | ICD-10-CM | POA: Diagnosis present

## 2017-01-30 NOTE — Patient Instructions (Signed)
   Doorway Pec Stretch, Low  Stand in a doorway of standard width. Place forearms against door frame, with elbows oriented slightly BELOW shoulders. Step forward with one leg, shifting body weight forward through doorway until moderate stretch is felt in the front of the shoulders.  Hold 30 sec, repeat 3x.      SCAPULAR RETRACTIONS  Draw your shoulder blades back and down.  Hold 3 sec, repeat 15x.    [place] [address], [place], [place] [address] Phone # [phone] Fax [phone]

## 2017-01-30 NOTE — Therapy (Signed)
[facility] [address], [place], [place], [address] Phone: [phone]   Fax:  [phone]  Physical Therapy Treatment  Patient Details  Name: Jennifer Green MRN: [id number] Date of Birth: Jun 19, 1940 Referring Provider: Betty Martinique, MD    Encounter Date: 01/30/2017  PT End of Session - 01/30/17 1111    Visit Number  1    Number of Visits  16    Date for PT Re-Evaluation  03/27/17    Authorization Type  Medicare A and B     Authorization Time Period  01/30/17 to 03/27/17    Authorization - Visit Number  1    Authorization - Number of Visits  10    PT Start Time  3664    PT Stop Time  1054    PT Time Calculation (min)  38 min    Activity Tolerance  Patient tolerated treatment well;No increased pain    Behavior During Therapy  WFL for tasks assessed/performed       Past Medical History:  Diagnosis Date  . Allergy   . Elevated blood sugar    790.21  . GERD (gastroesophageal reflux disease)   . Glaucoma   . Gout   . Hypertension   . Hypokalemia   . Hypomagnesemia   . Insomnia   . Murmur   . OP (osteoporosis)   . PVC (premature ventricular contraction)     Past Surgical History:  Procedure Laterality Date  . CERVICAL FUSION  05/2010  . EYE SURGERY     right and left    There were no vitals filed for this visit.  Subjective Assessment - 01/30/17 1025    Subjective  Pt reports that her pain started back in July which seemed to resolve on its own with ibuprofen. About 2-3 months ago, her pain returned and has gotten worse. She is not sure what caused it, and she has tried changing her pillows without any improvement. She denies any numbness and tingling at this time. Her pain is typically along the Rt shoulder blade and wraps up towards the Rt side of the neck.     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  No/denies    Pain Descriptors /  Indicators  Throbbing;Aching    Pain Type  Chronic pain    Pain Onset  More than a month ago    Pain Frequency  Intermittent    Aggravating Factors   turning head to the Rt, leaning forward and reaching with the Rt arm    Pain Relieving Factors  heat helps some         [facility] PT Assessment - 01/30/17 0001      Assessment   Medical Diagnosis  Cervical pain     Referring Provider  Betty Martinique, MD     Hand Dominance  Right    Next MD Visit  4-6 weeks     Prior Therapy  none for this       Precautions   Precautions  None    Precaution Comments  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   No    Is the patient reluctant to  leave their home because of a fear of falling?   No      Home Film/video editor residence      Prior Function   Level of Independence  Independent      Cognition   Overall Cognitive Status  Within Functional Limits for tasks assessed      Observation/Other Assessments   Focus on Therapeutic Outcomes (FOTO)   46% limited       Sensation   Additional Comments  pt denies any numbness or tingling       Posture/Postural Control   Posture Comments  forward head and rounded shoulders.       ROM / Strength   AROM / PROM / Strength  AROM;Strength;PROM      AROM   AROM Assessment Site  Cervical;Shoulder    Cervical Flexion  WNL, pull in upper thoracic spine    Cervical Extension  30 deg  pain free    Cervical - Right Side Bend  35 pain free    Cervical - Left Side Bend  25 pain Rt upper trap    Cervical - Right Rotation  40    Cervical - Left Rotation  35 pain on the Lt       Strength   Strength Assessment Site  Shoulder;Elbow    Right/Left Shoulder  Right;Left    Right Shoulder Flexion  4/5    Right Shoulder ABduction  3+/5 Pain along medial scapular border    Right Shoulder Internal Rotation  4/5  pain posterior shoulder    Right Shoulder External Rotation  4/5    Left Shoulder Flexion  4/5    Left Shoulder ABduction  4/5    Left Shoulder Internal Rotation  4+/5    Left Shoulder External Rotation  4+/5    Right/Left Elbow  Right;Left    Right Elbow Flexion  5/5    Left Elbow Flexion  5/5      Palpation   Palpation comment  tender with palpation along the Rt rhomboids/thoracic paraspinals, upper trap, levator scap, sternocleidomastoid, teres muscle group      Special Tests    Special Tests  Rotator Cuff Impingement    Rotator Cuff Impingment tests  Belly Press;Hawkins- Kennedy test;Neer impingement test      Neer Impingement test    Findings  Positive    Side  Right      Hawkins-Kennedy test   Findings  Positive    Side  Right      Belly Press   Findings  Positive    Side  Right                  [facility] Adult PT Treatment/Exercise - 01/30/17 0001      Exercises   Exercises  Neck;Shoulder      Shoulder Exercises: Seated   Retraction  10 reps      Neck Exercises: Stretches   Chest Stretch  2 reps;30 seconds    Chest Stretch Limitations  doorway              PT Education - 01/30/17 1130    Education provided  Yes    Education Details  eval findings, POC moving forward, mechanics of shoulder motion and importance of addressing areas of limitation/weakness to improve shoulder mobility and decrease pain; HEP implemented     Person(s) Educated  Patient    Methods  Explanation;Handout    Comprehension  Verbalized understanding;Returned demonstration  PT Short Term Goals - 01/30/17 1118      PT SHORT TERM GOAL #1   Title  Pt will demo consistency and independence with her HEP to improve ROM and strength.     Time  4    Period  Weeks    Status  New    Target Date  02/27/17      PT SHORT TERM GOAL #2   Title  Pt will report atleast 25% reduction in her pain/stiffness with daily activity from the start of therapy.     Time  4    Period   Weeks    Status  New      PT SHORT TERM GOAL #3   Title  Pt will demo improved cervical rotation to atleast 50 deg each direction without increase in pain, to allow her to turn her head while driving.     Time  4    Period  Weeks    Status  New        PT Long Term Goals - 01/30/17 1122      PT LONG TERM GOAL #1   Title  Pt will demo improved Rt shoulder strength to atleast 4+/5 MMT, pain free, to allow her to complete daily activity with less difficulty.     Time  8    Period  Weeks    Status  New    Target Date  03/27/17      PT LONG TERM GOAL #2   Title  Pt will report atleast 50% improvement in her pain/symptoms from the start of therapy, to improve her quality of life and activity participation.     Time  8    Period  Weeks    Status  New      PT LONG TERM GOAL #3   Title  Pt will report 50% less difficulty with cleaning her house throughout the week, to increase her independence.    Time  8    Period  Weeks    Status  New      PT LONG TERM GOAL #4   Title  Pt will be able to lift atleast 10# from the sink to the counter with her RUE without increase in pain or noted difficulty 3/5 trials, to allow her to complete cooking tasks at home without assistance.     Time  8    Period  Weeks    Status  New            Plan - 01/30/17 1058    Clinical Impression Statement  Pt is a pleasant 76 y.o F referred to OPPT with complaints of Rt shoulder and Rt sided neck pain for several months. She presents with limitations in cervical ROM, limited shoulder strength and pain with resistance into internal rotation/abduction, as well as painful Neer and Hawkins's Kennedy testing. Her lack of scapula mobility is likely contributing to impingement type symptoms of the Rt shoulder. She has tenderness with palpation of the posterior shoulder girdle, Rt mid thoracic and Rt cervical musculature. She also demonstrates postural limitations with forward head/rounded shoulders. Her pain is  limiting her ability to complete daily tasks such as cleaning her home and lifting pots while cooking and she would benefit from skilled PT to address these limitations in ROM, strength and neuromuscular control in order to facilitate return to daily activity without pain or difficulty.     History and Personal Factors relevant to plan of care:  history  of cervical fusion surgery 2012 (C5/C6)    Clinical Presentation  Evolving    Clinical Presentation due to:  worsening pain since most recent onset     Clinical Decision Making  Low    Rehab Potential  Good    PT Frequency  2x / week    PT Duration  8 weeks    PT Treatment/Interventions  ADLs/Self Care Home Management;Cryotherapy;Electrical Stimulation;Moist Heat;Iontophoresis 4mg /ml Dexamethasone;Therapeutic activities;Therapeutic exercise;Patient/family education;Neuromuscular re-education;Passive range of motion;Taping;Dry needling;Manual techniques    PT Next Visit Plan  cervical ROM, soft tissue techniques to address muscle spasm along Rt cervical and periscapular musculature, posture strengthening    PT Home Exercise Plan  scap retraction, pec stretch in doorway    Consulted and Agree with Plan of Care  Patient       Patient will benefit from skilled therapeutic intervention in order to improve the following deficits and impairments:  Decreased activity tolerance, Decreased range of motion, Decreased strength, Hypomobility, Impaired UE functional use, Impaired flexibility, Increased muscle spasms, Postural dysfunction, Improper body mechanics, Pain  Visit Diagnosis: Cervicalgia  Right shoulder pain, unspecified chronicity  Muscle spasm of back  Abnormal posture   G-Codes - 02-25-2017 1057    Functional Assessment Tool Used (Outpatient Only)  FOTO: 46% limited     Functional Limitation  Carrying, moving and handling objects    Carrying, Moving and Handling Objects Current Status (Z0092)  At least 40 percent but less than 60 percent  impaired, limited or restricted    Carrying, Moving and Handling Objects Goal Status (Z3007)  At least 20 percent but less than 40 percent impaired, limited or restricted       Problem List Patient Active Problem List   Diagnosis Date Noted  . Achilles tendon contracture, right 02/13/2016  . Posterior tibial tendinitis, right leg 01/16/2016  . Pain in right ankle and joints of right foot 01/02/2016  . Sinus bradycardia 10/06/2015  . 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 Dyspnea and respiratory abnormality 02/25/2014  . Ataxia 08/12/2013  . Peripheral edema 08/12/2013  . Meningioma ([place]) 04/29/2013  . Hearing loss 03/23/2013  . Hemoptysis 02/23/2013  . PVC's (premature ventricular contractions) 07/15/2011  . Coronary artery calcification seen on CAT scan 03/28/2011  . Lung nodule 12/31/2010  . Chronic cough 12/31/2010  . Hypokalemia 12/05/2010  . Irregular heart rate 12/05/2010  . Insomnia 02/16/2009  . CHEST WALL PAIN, ACUTE 12/30/2008  . [place] DISEASE, CERVICAL 03/24/2007  . GOUT 07/11/2006  . Essential hypertension 07/11/2006  . ALLERGIC RHINITIS 07/11/2006  . GERD 07/11/2006   11:32 AM,2017/02/25 Elly Modena PT, DPT [place] [facility] [address], [place], [place], [address] Phone: [phone]   Fax:  [phone]  Name: Jennifer Green MRN: [id number] Date of Birth: Apr 05, 1940

## 2017-02-12 ENCOUNTER — Ambulatory Visit: Payer: Medicare Other | Attending: Family Medicine | Admitting: Physical Therapy

## 2017-02-12 DIAGNOSIS — R2689 Other abnormalities of gait and mobility: Secondary | ICD-10-CM | POA: Diagnosis present

## 2017-02-12 DIAGNOSIS — M6281 Muscle weakness (generalized): Secondary | ICD-10-CM | POA: Diagnosis present

## 2017-02-12 DIAGNOSIS — R293 Abnormal posture: Secondary | ICD-10-CM | POA: Diagnosis present

## 2017-02-12 DIAGNOSIS — M542 Cervicalgia: Secondary | ICD-10-CM | POA: Diagnosis not present

## 2017-02-12 DIAGNOSIS — M25511 Pain in right shoulder: Secondary | ICD-10-CM

## 2017-02-12 DIAGNOSIS — M62830 Muscle spasm of back: Secondary | ICD-10-CM

## 2017-02-12 NOTE — Patient Instructions (Addendum)
  Horizontal shoulder abduction  Stretch band across chest while keeping elbows straight and pinching shoulder blades. Slowly return to starting position keeping elbows straight . Complete 2 sets of 10 reps. Using yellow band.   [place] [address] [place], [place] [address] Phone # [phone] Fax [phone]

## 2017-02-12 NOTE — Therapy (Signed)
[facility] [address] [place], [place], [address] Phone: [phone]   Fax:  [phone]  Physical Therapy Treatment  Patient Details  Name: Jennifer Green MRN: [id number] Date of Birth: 12-11-1940 Referring Provider: Betty Martinique, MD    Encounter Date: 02/12/2017  PT End of Session - 02/12/17 1025    Visit Number  2    Number of Visits  16    Date for PT Re-Evaluation  03/27/17    Authorization Type  Medicare A and B     Authorization Time Period  01/30/17 to 03/27/17    Authorization - Visit Number  2    Authorization - Number of Visits  10    PT Start Time  0930    PT Stop Time  1012    PT Time Calculation (min)  42 min    Activity Tolerance  Patient tolerated treatment well;No increased pain    Behavior During Therapy  WFL for tasks assessed/performed       Past Medical History:  Diagnosis Date  . Allergy   . Elevated blood sugar    790.21  . GERD (gastroesophageal reflux disease)   . Glaucoma   . Gout   . Hypertension   . Hypokalemia   . Hypomagnesemia   . Insomnia   . Murmur   . OP (osteoporosis)   . PVC (premature ventricular contraction)     Past Surgical History:  Procedure Laterality Date  . CERVICAL FUSION  05/2010  . EYE SURGERY     right and left    There were no vitals filed for this visit.  Subjective Assessment - 02/12/17 0932    Subjective  Pt reports that she does have some pain today which she has noticed for the past several days. She is completing her exercises at home.     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  Yes    Pain Score  4     Pain Location  Shoulder    Pain Orientation  Right    Pain Descriptors / Indicators  Sore    Pain Type  Chronic pain    Pain Radiating Towards  none     Pain Onset  More than a month ago    Pain Frequency  Intermittent    Aggravating Factors   using the arm     Pain  Relieving Factors  heat helps some    Effect of Pain on Daily Activities  difficulty cooking                       [facility] Adult PT Treatment/Exercise - 02/12/17 0001      Shoulder Exercises: Supine   Horizontal ABduction  Both;15 reps    External Rotation  Both;10 reps;Strengthening;Theraband    Theraband Level (Shoulder External Rotation)  Level 1 (Yellow)    Other Supine Exercises  scapular retraction x10 reps    Other Supine Exercises  D2 shoulder flexion with yellow TB x10 reps each       Manual Therapy   Manual Therapy  Myofascial release;Soft tissue mobilization;Scapular mobilization    Soft tissue mobilization  STM Rt latissimus     Myofascial Release  Rt teres/latissimus TPR during active muscle contraction; Pec major release on the Rt during shoulder abduction    Scapular Mobilization  Rt scapular upward rotation,  protraction, elevation/depression             PT Education - 02/12/17 1024    Education provided  Yes    Education Details  explained how compensations with activity can impact muscle performance along the chain; HEP addition     Person(s) Educated  Patient    Methods  Explanation;Handout    Comprehension  Verbalized understanding;Returned demonstration       PT Short Term Goals - 02/12/17 1028      PT SHORT TERM GOAL #1   Title  Pt will demo consistency and independence with her HEP to improve ROM and strength.     Time  4    Period  Weeks    Status  Achieved      PT SHORT TERM GOAL #2   Title  Pt will report atleast 25% reduction in her pain/stiffness with daily activity from the start of therapy.     Time  4    Period  Weeks    Status  On-going      PT SHORT TERM GOAL #3   Title  Pt will demo improved cervical rotation to atleast 50 deg each direction without increase in pain, to allow her to turn her head while driving.     Time  4    Period  Weeks    Status  On-going        PT Long Term Goals - 01/30/17 1122      PT  LONG TERM GOAL #1   Title  Pt will demo improved Rt shoulder strength to atleast 4+/5 MMT, pain free, to allow her to complete daily activity with less difficulty.     Time  8    Period  Weeks    Status  New    Target Date  03/27/17      PT LONG TERM GOAL #2   Title  Pt will report atleast 50% improvement in her pain/symptoms from the start of therapy, to improve her quality of life and activity participation.     Time  8    Period  Weeks    Status  New      PT LONG TERM GOAL #3   Title  Pt will report 50% less difficulty with cleaning her house throughout the week, to increase her independence.    Time  8    Period  Weeks    Status  New      PT LONG TERM GOAL #4   Title  Pt will be able to lift atleast 10# from the sink to the counter with her RUE without increase in pain or noted difficulty 3/5 trials, to allow her to complete cooking tasks at home without assistance.     Time  8    Period  Weeks    Status  New            Plan - 02/12/17 1025    Clinical Impression Statement  Pt arrived today with report of 4/10 pain along the Rt shoulder and Rt thoracic region. Session focused on therex to improve postural strength and shoulder flexibility. Completed manual techniques to decrease latissimus and teres muscle restriction, noting improvements in overhead shoulder elevation without pain following this. Pt demonstrated good understanding of HEP addition. Will continue with current POC.     Rehab Potential  Good    PT Frequency  2x / week    PT Duration  8 weeks    PT Treatment/Interventions  ADLs/Self Care Home Management;Cryotherapy;Electrical Stimulation;Moist Heat;Iontophoresis 4mg /ml Dexamethasone;Therapeutic activities;Therapeutic exercise;Patient/family education;Neuromuscular re-education;Passive range of motion;Taping;Dry needling;Manual techniques    PT Next Visit Plan  soft tissue techniques to address muscle spasm along Rt cervical and periscapular musculature, posture  strengthening and shoulder girdle control    PT Home Exercise Plan  scap retraction, pec stretch in doorway, supine horizontal abd with yellow TB    Consulted and Agree with Plan of Care  Patient       Patient will benefit from skilled therapeutic intervention in order to improve the following deficits and impairments:  Decreased activity tolerance, Decreased range of motion, Decreased strength, Hypomobility, Impaired UE functional use, Impaired flexibility, Increased muscle spasms, Postural dysfunction, Improper body mechanics, Pain  Visit Diagnosis: Cervicalgia  Right shoulder pain, unspecified chronicity  Muscle spasm of back  Abnormal posture  Muscle weakness (generalized)     Problem List Patient Active Problem List   Diagnosis Date Noted  . Achilles tendon contracture, right 02/13/2016  . Posterior tibial tendinitis, right leg 01/16/2016  . Pain in right ankle and joints of right foot 01/02/2016  . Sinus bradycardia 10/06/2015  . 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 Dyspnea and respiratory abnormality 02/25/2014  . Ataxia 08/12/2013  . Peripheral edema 08/12/2013  . Meningioma ([place]) 04/29/2013  . Hearing loss 03/23/2013  . Hemoptysis 02/23/2013  . PVC's (premature ventricular contractions) 07/15/2011  . Coronary artery calcification seen on CAT scan 03/28/2011  . Lung nodule 12/31/2010  . Chronic cough 12/31/2010  . Hypokalemia 12/05/2010  . Irregular heart rate 12/05/2010  . Insomnia 02/16/2009  . CHEST WALL PAIN, ACUTE 12/30/2008  . [employer] DISEASE, CERVICAL 03/24/2007  . GOUT 07/11/2006  . Essential hypertension 07/11/2006  . ALLERGIC RHINITIS 07/11/2006  . GERD 07/11/2006    10:29 AM,02/12/17 Elly Modena PT, DPT [place] [facility] [address] [place], [place], [address] Phone: [phone]   Fax:  [phone]  Name:  IFRAH VEST MRN: [id number] Date of Birth: 11-22-1940

## 2017-02-14 ENCOUNTER — Ambulatory Visit: Payer: Medicare Other | Admitting: Physical Therapy

## 2017-02-14 ENCOUNTER — Encounter: Payer: Self-pay | Admitting: Physical Therapy

## 2017-02-14 DIAGNOSIS — M62830 Muscle spasm of back: Secondary | ICD-10-CM

## 2017-02-14 DIAGNOSIS — M542 Cervicalgia: Secondary | ICD-10-CM | POA: Diagnosis not present

## 2017-02-14 DIAGNOSIS — R293 Abnormal posture: Secondary | ICD-10-CM

## 2017-02-14 DIAGNOSIS — M25511 Pain in right shoulder: Secondary | ICD-10-CM

## 2017-02-14 DIAGNOSIS — M6281 Muscle weakness (generalized): Secondary | ICD-10-CM

## 2017-02-14 NOTE — Therapy (Signed)
[facility] [address] [place], [place], [address] Phone: [phone]   Fax:  [phone]  Physical Therapy Treatment  Patient Details  Name: Jennifer Green MRN: [id number] Date of Birth: 02/24/1940 Referring Provider: Betty Martinique, MD    Encounter Date: 02/14/2017  PT End of Session - 02/14/17 0921    Visit Number  3    Number of Visits  16    Date for PT Re-Evaluation  03/27/17    Authorization Type  Medicare A and B     Authorization Time Period  01/30/17 to 03/27/17    Authorization - Visit Number  3    PT Start Time  0921    PT Stop Time  3500    PT Time Calculation (min)  54 min    Activity Tolerance  Patient tolerated treatment well;No increased pain    Behavior During Therapy  WFL for tasks assessed/performed       Past Medical History:  Diagnosis Date  . Allergy   . Elevated blood sugar    790.21  . GERD (gastroesophageal reflux disease)   . Glaucoma   . Gout   . Hypertension   . Hypokalemia   . Hypomagnesemia   . Insomnia   . Murmur   . OP (osteoporosis)   . PVC (premature ventricular contraction)     Past Surgical History:  Procedure Laterality Date  . CERVICAL FUSION  05/2010  . EYE SURGERY     right and left    There were no vitals filed for this visit.  Subjective Assessment - 02/14/17 0922    Subjective  I am having bad morning with my wrist and grip. I cannot grip items and the pain is pretty bad in the wrist and thumb RT.    Currently in Pain?  Yes Rt cervical 3/10, Rt wrist/thumb 10/10    Pain Orientation  Right    Pain Descriptors / Indicators  Throbbing frustrating    Aggravating Factors   picking up stuff, putting weight into RT arm    Pain Relieving Factors  Heat helps some    Multiple Pain Sites  No                      [facility] Adult PT Treatment/Exercise - 02/14/17 0001      Neck Exercises: Supine   Other Supine Exercise  Supine neck lengthener 2x 5      Other Supine Exercise  Gentle scap squeezes 2x5      Moist Heat Therapy   Number Minutes Moist Heat  15 Minutes    Moist Heat Location  -- Neck and scapular      Electrical Stimulation   Electrical Stimulation Location  Rt cervical to upper arm    Electrical Stimulation Action  IFC    Electrical Stimulation Parameters  80-150 HZ    Electrical Stimulation Goals  Pain      Manual Therapy   Manual therapy comments  Gentle traction to cervical 3x 30 sec    Soft tissue mobilization  RT cervical, occiput BIL, UT bil , RT>LT then instrument asst feathering for pain along RTarm and scapular area               PT Short Term Goals - 02/12/17 1028      PT SHORT TERM GOAL #1   Title  Pt will demo consistency and independence with her HEP to improve ROM and strength.  Time  4    Period  Weeks    Status  Achieved      PT SHORT TERM GOAL #2   Title  Pt will report atleast 25% reduction in her pain/stiffness with daily activity from the start of therapy.     Time  4    Period  Weeks    Status  On-going      PT SHORT TERM GOAL #3   Title  Pt will demo improved cervical rotation to atleast 50 deg each direction without increase in pain, to allow her to turn her head while driving.     Time  4    Period  Weeks    Status  On-going        PT Long Term Goals - 01/30/17 1122      PT LONG TERM GOAL #1   Title  Pt will demo improved Rt shoulder strength to atleast 4+/5 MMT, pain free, to allow her to complete daily activity with less difficulty.     Time  8    Period  Weeks    Status  New    Target Date  03/27/17      PT LONG TERM GOAL #2   Title  Pt will report atleast 50% improvement in her pain/symptoms from the start of therapy, to improve her quality of life and activity participation.     Time  8    Period  Weeks    Status  New      PT LONG TERM GOAL #3   Title  Pt will report 50% less difficulty with cleaning her house throughout the week, to increase her  independence.    Time  8    Period  Weeks    Status  New      PT LONG TERM GOAL #4   Title  Pt will be able to lift atleast 10# from the sink to the counter with her RUE without increase in pain or noted difficulty 3/5 trials, to allow her to complete cooking tasks at home without assistance.     Time  8    Period  Weeks    Status  New            Plan - 02/14/17 9024    Clinical Impression Statement  Pt presents today with inceased Rt wrist and thumb pain that she rates at a 10. She cannot grip with her Rt hand or pick up even light items. The Rt cervical area also hurts but not as bad. She has done some of her HEP but cannot do any of the band exercises as it hurts to grip the band. Cervical soft tissues felt most restricted in the occiput region. Estim was used for pain reduction.  There was no pain in the cervical region at end of session and wrist pain was diminished but not absent.     Rehab Potential  Good    PT Frequency  2x / week    PT Duration  8 weeks    PT Treatment/Interventions  ADLs/Self Care Home Management;Cryotherapy;Electrical Stimulation;Moist Heat;Iontophoresis 4mg /ml Dexamethasone;Therapeutic activities;Therapeutic exercise;Patient/family education;Neuromuscular re-education;Passive range of motion;Taping;Dry needling;Manual techniques    PT Next Visit Plan  Assess pain, treat pain if still high, check if pt can grip better, then proceed into postural strengthening consider scap isometrics?    Consulted and Agree with Plan of Care  Patient       Patient will benefit from skilled therapeutic intervention in order  to improve the following deficits and impairments:  Decreased activity tolerance, Decreased range of motion, Decreased strength, Hypomobility, Impaired UE functional use, Impaired flexibility, Increased muscle spasms, Postural dysfunction, Improper body mechanics, Pain  Visit Diagnosis: Cervicalgia  Right shoulder pain, unspecified chronicity  Muscle  spasm of back  Abnormal posture  Muscle weakness (generalized)     Problem List Patient Active Problem List   Diagnosis Date Noted  . Achilles tendon contracture, right 02/13/2016  . Posterior tibial tendinitis, right leg 01/16/2016  . Pain in right ankle and joints of right foot 01/02/2016  . Sinus bradycardia 10/06/2015  . 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 Dyspnea and respiratory abnormality 02/25/2014  . Ataxia 08/12/2013  . Peripheral edema 08/12/2013  . Meningioma ([place]) 04/29/2013  . Hearing loss 03/23/2013  . Hemoptysis 02/23/2013  . PVC's (premature ventricular contractions) 07/15/2011  . Coronary artery calcification seen on CAT scan 03/28/2011  . Lung nodule 12/31/2010  . Chronic cough 12/31/2010  . Hypokalemia 12/05/2010  . Irregular heart rate 12/05/2010  . Insomnia 02/16/2009  . CHEST WALL PAIN, ACUTE 12/30/2008  . [place] DISEASE, CERVICAL 03/24/2007  . GOUT 07/11/2006  . Essential hypertension 07/11/2006  . ALLERGIC RHINITIS 07/11/2006  . GERD 07/11/2006    Katherene Dinino, PTA 02/14/2017, 12:03 PM  [facility] [address], [place], [place], [address] Phone: [phone]   Fax:  [phone]  Name: Jennifer Green MRN: [id number] Date of Birth: Jan 14, 1941

## 2017-02-19 ENCOUNTER — Ambulatory Visit: Payer: Medicare Other | Admitting: Physical Therapy

## 2017-02-19 ENCOUNTER — Encounter: Payer: Self-pay | Admitting: Physical Therapy

## 2017-02-19 DIAGNOSIS — M62830 Muscle spasm of back: Secondary | ICD-10-CM

## 2017-02-19 DIAGNOSIS — M25511 Pain in right shoulder: Secondary | ICD-10-CM

## 2017-02-19 DIAGNOSIS — R293 Abnormal posture: Secondary | ICD-10-CM

## 2017-02-19 DIAGNOSIS — M6281 Muscle weakness (generalized): Secondary | ICD-10-CM

## 2017-02-19 DIAGNOSIS — M542 Cervicalgia: Secondary | ICD-10-CM | POA: Diagnosis not present

## 2017-02-19 DIAGNOSIS — R2689 Other abnormalities of gait and mobility: Secondary | ICD-10-CM

## 2017-02-19 NOTE — Therapy (Signed)
[facility] [address] [place], [place], [address] Phone: [phone]   Fax:  [phone]  Physical Therapy Treatment  Patient Details  Name: Jennifer Green MRN: [id number] Date of Birth: 06-23-1940 Referring Provider: Betty Martinique, MD    Encounter Date: 02/19/2017  PT End of Session - 02/19/17 0930    Visit Number  4    Number of Visits  16    Date for PT Re-Evaluation  03/27/17    Authorization Type  Medicare A and B     Authorization Time Period  01/30/17 to 03/27/17    Authorization - Visit Number  4    PT Start Time  0930    PT Stop Time  1020    PT Time Calculation (min)  50 min    Activity Tolerance  No increased pain;Patient limited by pain    Behavior During Therapy  [facility] for tasks assessed/performed Although pt is frustrated with this pain not changing.        Past Medical History:  Diagnosis Date  . Allergy   . Elevated blood sugar    790.21  . GERD (gastroesophageal reflux disease)   . Glaucoma   . Gout   . Hypertension   . Hypokalemia   . Hypomagnesemia   . Insomnia   . Murmur   . OP (osteoporosis)   . PVC (premature ventricular contraction)     Past Surgical History:  Procedure Laterality Date  . CERVICAL FUSION  05/2010  . EYE SURGERY     right and left    There were no vitals filed for this visit.  Subjective Assessment - 02/19/17 0941    Subjective  The soft tissue work was helpful in reducing intensity of pain for the rest of the day but the pain returned with same crushing intensity of 10/10 and presents today with this pain. She is considering calling MD today.     Currently in Pain?  Yes    Pain Score  10-Worst pain ever Rt neck all the way down to the wrist    Pain Descriptors / Indicators  Throbbing;Crushing;Radiating    Multiple Pain Sites  No                      [facility] Adult PT Treatment/Exercise - 02/19/17 0001      Electrical Stimulation   Electrical Stimulation Location  Rt cervical to upper arm    Electrical Stimulation Action  IFC    Electrical Stimulation Parameters  80-150 HZ supine    Electrical Stimulation Goals  Pain      Manual Therapy   Manual therapy comments  Gentle traction to cervical 3x 30 sec    Soft tissue mobilization  RT cervical, occiput BIL, UT bil                PT Short Term Goals - 02/12/17 1028      PT SHORT TERM GOAL #1   Title  Pt will demo consistency and independence with her HEP to improve ROM and strength.     Time  4    Period  Weeks    Status  Achieved      PT SHORT TERM GOAL #2   Title  Pt will report atleast 25% reduction in her pain/stiffness with daily activity from the start of therapy.     Time  4    Period  Weeks    Status  On-going  PT SHORT TERM GOAL #3   Title  Pt will demo improved cervical rotation to atleast 50 deg each direction without increase in pain, to allow her to turn her head while driving.     Time  4    Period  Weeks    Status  On-going        PT Long Term Goals - 01/30/17 1122      PT LONG TERM GOAL #1   Title  Pt will demo improved Rt shoulder strength to atleast 4+/5 MMT, pain free, to allow her to complete daily activity with less difficulty.     Time  8    Period  Weeks    Status  New    Target Date  03/27/17      PT LONG TERM GOAL #2   Title  Pt will report atleast 50% improvement in her pain/symptoms from the start of therapy, to improve her quality of life and activity participation.     Time  8    Period  Weeks    Status  New      PT LONG TERM GOAL #3   Title  Pt will report 50% less difficulty with cleaning her house throughout the week, to increase her independence.    Time  8    Period  Weeks    Status  New      PT LONG TERM GOAL #4   Title  Pt will be able to lift atleast 10# from the sink to the counter with her RUE without increase in pain or noted difficulty 3/5 trials, to allow her to complete cooking tasks at  home without assistance.     Time  8    Period  Weeks    Status  New            Plan - 02/19/17 0932    Clinical Impression Statement  Pt presents today with very  intense pain from her Rt lateral neck to her wrist, fingers. She can barely close her fingers making it impossible to grip anything. Pain increasing with use of her hand/wrist. She did report that she got a days worth of relief of the crushing pain after her Pt session but it returns she as start to use the RTUE. She was encouraged to call her MD today. She agreed to do so.     Rehab Potential  Good    PT Frequency  2x / week    PT Duration  8 weeks    PT Treatment/Interventions  ADLs/Self Care Home Management;Cryotherapy;Electrical Stimulation;Moist Heat;Iontophoresis 4mg /ml Dexamethasone;Therapeutic activities;Therapeutic exercise;Patient/family education;Neuromuscular re-education;Passive range of motion;Taping;Dry needling;Manual techniques    PT Next Visit Plan  See if pt called MD, assess pain.     Consulted and Agree with Plan of Care  Patient       Patient will benefit from skilled therapeutic intervention in order to improve the following deficits and impairments:  Decreased activity tolerance, Decreased range of motion, Decreased strength, Hypomobility, Impaired UE functional use, Impaired flexibility, Increased muscle spasms, Postural dysfunction, Improper body mechanics, Pain  Visit Diagnosis: Cervicalgia  Right shoulder pain, unspecified chronicity  Muscle spasm of back  Abnormal posture  Other abnormalities of gait and mobility  Muscle weakness (generalized)     Problem List Patient Active Problem List   Diagnosis Date Noted  . Achilles tendon contracture, right 02/13/2016  . Posterior tibial tendinitis, right leg 01/16/2016  . Pain in right ankle and joints of right foot  01/02/2016  . Sinus bradycardia 10/06/2015  . 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 Dyspnea and respiratory  abnormality 02/25/2014  . Ataxia 08/12/2013  . Peripheral edema 08/12/2013  . Meningioma ([place]) 04/29/2013  . Hearing loss 03/23/2013  . Hemoptysis 02/23/2013  . PVC's (premature ventricular contractions) 07/15/2011  . Coronary artery calcification seen on CAT scan 03/28/2011  . Lung nodule 12/31/2010  . Chronic cough 12/31/2010  . Hypokalemia 12/05/2010  . Irregular heart rate 12/05/2010  . Insomnia 02/16/2009  . CHEST WALL PAIN, ACUTE 12/30/2008  . [place] DISEASE, CERVICAL 03/24/2007  . GOUT 07/11/2006  . Essential hypertension 07/11/2006  . ALLERGIC RHINITIS 07/11/2006  . GERD 07/11/2006    Ether Wolters, PTA 02/19/2017, 10:10 AM  [facility] [address] [place], [place], [address] Phone: [phone]   Fax:  [phone]  Name: Jennifer Green MRN: [id number] Date of Birth: December 15, 1940

## 2017-02-21 ENCOUNTER — Encounter: Payer: Self-pay | Admitting: Family Medicine

## 2017-02-21 ENCOUNTER — Encounter: Payer: Self-pay | Admitting: Physical Therapy

## 2017-02-21 ENCOUNTER — Ambulatory Visit (INDEPENDENT_AMBULATORY_CARE_PROVIDER_SITE_OTHER): Payer: Medicare Other | Admitting: Family Medicine

## 2017-02-21 ENCOUNTER — Ambulatory Visit: Payer: Medicare Other | Admitting: Physical Therapy

## 2017-02-21 VITALS — BP 140/76 | HR 76 | Temp 98.5°F | Wt 121.4 lb

## 2017-02-21 DIAGNOSIS — M25511 Pain in right shoulder: Secondary | ICD-10-CM

## 2017-02-21 DIAGNOSIS — M62830 Muscle spasm of back: Secondary | ICD-10-CM

## 2017-02-21 DIAGNOSIS — R2689 Other abnormalities of gait and mobility: Secondary | ICD-10-CM

## 2017-02-21 DIAGNOSIS — R293 Abnormal posture: Secondary | ICD-10-CM

## 2017-02-21 DIAGNOSIS — M542 Cervicalgia: Secondary | ICD-10-CM

## 2017-02-21 DIAGNOSIS — I10 Essential (primary) hypertension: Secondary | ICD-10-CM | POA: Diagnosis not present

## 2017-02-21 DIAGNOSIS — M6281 Muscle weakness (generalized): Secondary | ICD-10-CM

## 2017-02-21 LAB — BASIC METABOLIC PANEL
BUN: 17 mg/dL (ref 6–23)
CALCIUM: 9 mg/dL (ref 8.4–10.5)
CO2: 31 meq/L (ref 19–32)
Chloride: 101 mEq/L (ref 96–112)
Creatinine, Ser: 0.71 mg/dL (ref 0.40–1.20)
GFR: 84.43 mL/min (ref >60.00)
GLUCOSE: 123 mg/dL — AB (ref 70–99)
Potassium: 3.5 mEq/L (ref 3.5–5.1)
SODIUM: 143 meq/L (ref 135–145)

## 2017-02-21 NOTE — Therapy (Signed)
[facility] [address] [place], [place], [address] Phone: [phone]   Fax:  [phone]  Physical Therapy Treatment  Patient Details  Name: Jennifer Green MRN: [id number] Date of Birth: 04/04/1940 Referring Provider: Betty Martinique, MD    Encounter Date: 02/21/2017  PT End of Session - 02/21/17 0926    Visit Number  5    Number of Visits  16    Date for PT Re-Evaluation  03/27/17    Authorization Type  Medicare A and B     Authorization Time Period  01/30/17 to 03/27/17    Authorization - Visit Number  5    PT Start Time  0925    PT Stop Time  6962    PT Time Calculation (min)  50 min    Activity Tolerance  Patient limited by pain;Patient tolerated treatment well    Behavior During Therapy  [facility] for tasks assessed/performed       Past Medical History:  Diagnosis Date  . Allergy   . Elevated blood sugar    790.21  . GERD (gastroesophageal reflux disease)   . Glaucoma   . Gout   . Hypertension   . Hypokalemia   . Hypomagnesemia   . Insomnia   . Murmur   . OP (osteoporosis)   . PVC (premature ventricular contraction)     Past Surgical History:  Procedure Laterality Date  . CERVICAL FUSION  05/2010  . EYE SURGERY     right and left    There were no vitals filed for this visit.  Subjective Assessment - 02/21/17 0927    Subjective  Pt is seeing MD today, they could not work her in the other day. She continues to have intense pain but she can flex her RT fingers today which she could not do the other day. Her wrist pain is down slightly. Rt scapula/shoulder blade has most intense pain today.     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  Yes Rt cervical to scapula 10/10, Rt wrist 7/10, fingers 6-7/10    Pain Orientation  Right    Pain Descriptors / Indicators  Radiating    Aggravating Factors   Using the Rt arm/hand    Pain Relieving Factors  Heat, Estim, and meds    Multiple Pain  Sites  No                      [facility] Adult PT Treatment/Exercise - 02/21/17 0001      Electrical Stimulation   Electrical Stimulation Location  Rt lateral cervical to scapula in sidelying    Electrical Stimulation Action  IFC    Electrical Stimulation Parameters  80-150 HZ    Electrical Stimulation Goals  Pain      Manual Therapy   Soft tissue mobilization  RT lateral cervical, upper trap, post shoulder/scapula medial & inferior  border Pt LT sidelying               PT Short Term Goals - 02/12/17 1028      PT SHORT TERM GOAL #1   Title  Pt will demo consistency and independence with her HEP to improve ROM and strength.     Time  4    Period  Weeks    Status  Achieved      PT SHORT TERM GOAL #2   Title  Pt will report atleast 25% reduction  in her pain/stiffness with daily activity from the start of therapy.     Time  4    Period  Weeks    Status  On-going      PT SHORT TERM GOAL #3   Title  Pt will demo improved cervical rotation to atleast 50 deg each direction without increase in pain, to allow her to turn her head while driving.     Time  4    Period  Weeks    Status  On-going        PT Long Term Goals - 01/30/17 1122      PT LONG TERM GOAL #1   Title  Pt will demo improved Rt shoulder strength to atleast 4+/5 MMT, pain free, to allow her to complete daily activity with less difficulty.     Time  8    Period  Weeks    Status  New    Target Date  03/27/17      PT LONG TERM GOAL #2   Title  Pt will report atleast 50% improvement in her pain/symptoms from the start of therapy, to improve her quality of life and activity participation.     Time  8    Period  Weeks    Status  New      PT LONG TERM GOAL #3   Title  Pt will report 50% less difficulty with cleaning her house throughout the week, to increase her independence.    Time  8    Period  Weeks    Status  New      PT LONG TERM GOAL #4   Title  Pt will be able to lift atleast 10#  from the sink to the counter with her RUE without increase in pain or noted difficulty 3/5 trials, to allow her to complete cooking tasks at home without assistance.     Time  8    Period  Weeks    Status  New            Plan - 02/21/17 0926    Clinical Impression Statement  Pt was not able to see the MD prior to todays session. She has an appt today at 11am. She presents with intense pain from her cervical Rt to her scapula. She rates this at 10/10. She is able to flex her Rt fingers fully today which at previous sesion she could not flex her fingers beyond maybe 50% of normal. She reports the Advil she is taking helps her pain eventually as well as getting a good days relief from soft tissue work and Estim in [employer]. Her Rt upper trap up into the lateral aspect of her neck had moderately dense musculature but no trigger points.     Rehab Potential  Good    PT Frequency  2x / week    PT Duration  8 weeks    PT Treatment/Interventions  ADLs/Self Care Home Management;Cryotherapy;Electrical Stimulation;Moist Heat;Iontophoresis 4mg /ml Dexamethasone;Therapeutic activities;Therapeutic exercise;Patient/family education;Neuromuscular re-education;Passive range of motion;Taping;Dry needling;Manual techniques    PT Next Visit Plan  See what MD said regarding her pain. If pain is reducing see if pt up for some exercise.     Consulted and Agree with Plan of Care  Patient       Patient will benefit from skilled therapeutic intervention in order to improve the following deficits and impairments:  Decreased activity tolerance, Decreased range of motion, Decreased strength, Hypomobility, Impaired UE functional use, Impaired flexibility, Increased muscle spasms,  Postural dysfunction, Improper body mechanics, Pain  Visit Diagnosis: Cervicalgia  Right shoulder pain, unspecified chronicity  Muscle spasm of back  Abnormal posture  Other abnormalities of gait and mobility  Muscle weakness  (generalized)     Problem List Patient Active Problem List   Diagnosis Date Noted  . Achilles tendon contracture, right 02/13/2016  . Posterior tibial tendinitis, right leg 01/16/2016  . Pain in right ankle and joints of right foot 01/02/2016  . Sinus bradycardia 10/06/2015  . 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 Dyspnea and respiratory abnormality 02/25/2014  . Ataxia 08/12/2013  . Peripheral edema 08/12/2013  . Meningioma ([place]) 04/29/2013  . Hearing loss 03/23/2013  . Hemoptysis 02/23/2013  . PVC's (premature ventricular contractions) 07/15/2011  . Coronary artery calcification seen on CAT scan 03/28/2011  . Lung nodule 12/31/2010  . Chronic cough 12/31/2010  . Hypokalemia 12/05/2010  . Irregular heart rate 12/05/2010  . Insomnia 02/16/2009  . CHEST WALL PAIN, ACUTE 12/30/2008  . [place] DISEASE, CERVICAL 03/24/2007  . GOUT 07/11/2006  . Essential hypertension 07/11/2006  . ALLERGIC RHINITIS 07/11/2006  . GERD 07/11/2006    Miliano Cotten, PTA 02/21/2017, 10:49 AM  [facility] [address], [place], [place], [address] Phone: [phone]   Fax:  [phone]  Name: Jennifer Green MRN: [id number] Date of Birth: 01/03/1941

## 2017-02-21 NOTE — Progress Notes (Signed)
   Subjective:    Patient ID: Jennifer Green, female    DOB: 1940-10-14, 77 y.o.   MRN: [id number]  HPI Here for worsening right neck pain and right arm radicular symptoms. She began to have right neck pain about 6 months ago and it has steadily gotten worse over time. She now has pain that radiates down the right arm into the hand. The right arm and hand get numb at times and she feels weakness in the arm and hand. Using Ibuprofen and heat. She had Xrays of the cervical spine on 01-28-17 showing a previous fusion at C5-C6 but also significant spurring especially the C6-C7 level on the right side.   Review of Systems  Respiratory: Negative.   Cardiovascular: Negative.   Musculoskeletal: Positive for neck pain and neck stiffness.  Neurological: Positive for weakness and numbness.       Objective:   Physical Exam  Constitutional: She appears well-developed and well-nourished.  Cardiovascular: Normal rate, regular rhythm, normal heart sounds and intact distal pulses.  Pulmonary/Chest: Effort normal. No respiratory distress. She has no rales.  Musculoskeletal:  Tender in the lower neck both posteriorly and on the right side. ROM is reduced          Assessment & Plan:  Right neck pain with radiculopathy. We will send her for a cervical spine MRI soon.  Alysia Penna, MD

## 2017-02-24 ENCOUNTER — Telehealth: Payer: Self-pay | Admitting: Family Medicine

## 2017-02-24 NOTE — Telephone Encounter (Signed)
Pt returned call and lab results given to her with verbal understanding.

## 2017-02-26 ENCOUNTER — Ambulatory Visit: Payer: Medicare Other | Admitting: Physical Therapy

## 2017-02-26 ENCOUNTER — Encounter: Payer: Self-pay | Admitting: Physical Therapy

## 2017-02-26 DIAGNOSIS — M542 Cervicalgia: Secondary | ICD-10-CM

## 2017-02-26 DIAGNOSIS — R2689 Other abnormalities of gait and mobility: Secondary | ICD-10-CM

## 2017-02-26 DIAGNOSIS — M62830 Muscle spasm of back: Secondary | ICD-10-CM

## 2017-02-26 DIAGNOSIS — R293 Abnormal posture: Secondary | ICD-10-CM

## 2017-02-26 DIAGNOSIS — M6281 Muscle weakness (generalized): Secondary | ICD-10-CM

## 2017-02-26 DIAGNOSIS — M25511 Pain in right shoulder: Secondary | ICD-10-CM

## 2017-02-26 NOTE — Therapy (Signed)
[facility] [address], [place], [place], [address] Phone: [phone]   Fax:  [phone]  Physical Therapy Treatment  Patient Details  Name: Jennifer Green MRN: [id number] Date of Birth: [birth year] Referring Provider: Betty Martinique, MD    Encounter Date: 02/26/2017  PT End of Session - 02/26/17 0929    Visit Number  6    Number of Visits  16    Date for PT Re-Evaluation  03/27/17    Authorization Type  Medicare A and B     Authorization Time Period  01/30/17 to 03/27/17    Authorization - Visit Number  6    PT Start Time  0930    PT Stop Time  1015    PT Time Calculation (min)  45 min    Activity Tolerance  Patient tolerated treatment well    Behavior During Therapy  [facility] for tasks assessed/performed       Past Medical History:  Diagnosis Date  . Allergy   . Elevated blood sugar    790.21  . GERD (gastroesophageal reflux disease)   . Glaucoma   . Gout   . Hypertension   . Hypokalemia   . Hypomagnesemia   . Insomnia   . Murmur   . OP (osteoporosis)   . PVC (premature ventricular contraction)     Past Surgical History:  Procedure Laterality Date  . CERVICAL FUSION  05/2010   per Dr. Newman Pies   . EYE SURGERY     right and left    There were no vitals filed for this visit.  Subjective Assessment - 02/26/17 0937    Subjective  Saw MD, Dr Sarajane Jews, last Friday. He has ordered MRI for this weekend.  Pt was advised to double her Advil which has been helpful.    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  Yes    Pain Score  5     Pain Location  Shoulder    Pain Orientation  Right    Pain Descriptors / Indicators  Dull;Discomfort    Aggravating Factors   constantly aggrevated    Pain Relieving Factors  Heat, estim and meds    Multiple Pain Sites  No                      [facility] Adult PT Treatment/Exercise - 02/26/17 0001       Self-Care   Self-Care  -- Posture, alignment for the purpose of decompression at cerv      Electrical Stimulation   Electrical Stimulation Location  Rt lateral cervical to scapula in sidelying    Electrical Stimulation Action  IFC    Electrical Stimulation Parameters  80-150 HZ    Electrical Stimulation Goals  Pain             PT Education - 02/26/17 [phone]    Education provided  Yes    Education Details  Supine decompression exercises    Person(s) Educated  Patient    Methods  Explanation;Demonstration;Tactile cues;Verbal cues;Handout    Comprehension  Verbalized understanding;Returned demonstration       PT Short Term Goals - 02/26/17 1007      PT SHORT TERM GOAL #2   Title  Pt will report atleast 25% reduction in her pain/stiffness with daily activity from the start of therapy.     Time  4    Period  Weeks    Status  On-going        PT Long Term Goals - 01/30/17 1122      PT LONG TERM GOAL #1   Title  Pt will demo improved Rt shoulder strength to atleast 4+/5 MMT, pain free, to allow her to complete daily activity with less difficulty.     Time  8    Period  Weeks    Status  New    Target Date  03/27/17      PT LONG TERM GOAL #2   Title  Pt will report atleast 50% improvement in her pain/symptoms from the start of therapy, to improve her quality of life and activity participation.     Time  8    Period  Weeks    Status  New      PT LONG TERM GOAL #3   Title  Pt will report 50% less difficulty with cleaning her house throughout the week, to increase her independence.    Time  8    Period  Weeks    Status  New      PT LONG TERM GOAL #4   Title  Pt will be able to lift atleast 10# from the sink to the counter with her RUE without increase in pain or noted difficulty 3/5 trials, to allow her to complete cooking tasks at home without assistance.     Time  8    Period  Weeks    Status  New            Plan - 02/26/17 1003    Clinical Impression  Statement  Pt saw new MD last week and has a cervical MRI ordered for this weekned. She was advised to double her Advil which has helped keep her pain managable. She was educated today in postural alignment and decompression exercises to add to her HEP. She was able to perform these exercises pain free and no radicular symptom increases. Estim conitnues to give her lasting paoin relief of 1-2 days.     Rehab Potential  Good    PT Frequency  2x / week    PT Duration  8 weeks    PT Treatment/Interventions  ADLs/Self Care Home Management;Cryotherapy;Electrical Stimulation;Moist Heat;Iontophoresis 4mg /ml Dexamethasone;Therapeutic activities;Therapeutic exercise;Patient/family education;Neuromuscular re-education;Passive range of motion;Taping;Dry needling;Manual techniques    PT Next Visit Plan  Continue with postural exercises in supine initially and progress to standing. MRI is scheduled for this Sunday. Measure cervical rotation next session.    Consulted and Agree with Plan of Care  Patient       Patient will benefit from skilled therapeutic intervention in order to improve the following deficits and impairments:  Decreased activity tolerance, Decreased range of motion, Decreased strength, Hypomobility, Impaired UE functional use, Impaired flexibility, Increased muscle spasms, Postural dysfunction, Improper body mechanics, Pain  Visit Diagnosis: Cervicalgia  Right shoulder pain, unspecified chronicity  Muscle spasm of back  Abnormal posture  Other abnormalities of gait and mobility  Muscle weakness (generalized)     Problem List Patient Active Problem List   Diagnosis Date Noted  . Achilles tendon contracture, right 02/13/2016  . Posterior tibial tendinitis, right leg 01/16/2016  . Pain in right ankle and joints of right foot 01/02/2016  . Sinus bradycardia 10/06/2015  . 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 Dyspnea and respiratory abnormality 02/25/2014  . Ataxia  08/12/2013  . Peripheral edema 08/12/2013  . Meningioma ([place]) 04/29/2013  .  Hearing loss 03/23/2013  . Hemoptysis 02/23/2013  . PVC's (premature ventricular contractions) 07/15/2011  . Coronary artery calcification seen on CAT scan 03/28/2011  . Lung nodule 12/31/2010  . Chronic cough 12/31/2010  . Hypokalemia 12/05/2010  . Irregular heart rate 12/05/2010  . Insomnia 02/16/2009  . CHEST WALL PAIN, ACUTE 12/30/2008  . [place] DISEASE, CERVICAL 03/24/2007  . GOUT 07/11/2006  . Essential hypertension 07/11/2006  . ALLERGIC RHINITIS 07/11/2006  . GERD 07/11/2006    Tailor Lucking, PTA 02/26/2017, 10:07 AM  [facility] [address], [place], [place], [address] Phone: [phone]   Fax:  [phone]  Name: Jennifer Green MRN: [id number] Date of Birth: 09/22/1940

## 2017-02-26 NOTE — Patient Instructions (Signed)
RE-ALIGNMENT ROUTINE EXERCISES-OSTEOPROROSIS BASIC FOR POSTURAL CORRECTION   RE-ALIGNMENT Tips BENEFITS: 1.It helps to re-align the curves of the back and improve standing posture. 2.It allows the back muscles to rest and strengthen in preparation for more activity. FREQUENCY: Daily, even after weeks, months and years of more advanced exercises. START: 1.All exercises start in the same position: lying on the back, arms resting on the supporting surface, palms up and slightly away from the body, backs of hands down, knees bent, feet flat. 2.The head, neck, arms, and legs are supported according to specific instructions of your therapist. Copyright  VHI. All rights reserved.    1. Decompression Exercise: Basic.   Takes compression off the vertebral bodies; increases tolerance for lying on the back; helps relieve back pain and neck pain.   Lie on back on firm surface, knees bent, feet flat, arms turned up, out to sides (~35 degrees). Head neck and arms supported as necessary. Time _5-15__ minutes. Surface: bed, small pillow ok as long as neck is not pushed too far forward.     2. Shoulder Press  Strengthens upper back extensors and scapular retractors.   Press both shoulders down. Hold _2-3__ seconds. Repeat _3-5__ times. Surface: bed, do 2x day       3. Head Press With [place]  Strengthens neck extensors   Tuck chin SLIGHTLY toward chest, keep mouth closed. Feel weight on back of head. Increase weight by pressing head down. Hold _2-3__ seconds. Relax. Repeat 3-5___ times. Surface: bed Do 2x day    4. Last movement is sliding arms down your bed with your fingers reaching towards the toes. AT this point you are combining all three movements together GENTLY!!   Heat is ok to the neck/shoulder as you do the exercises.

## 2017-02-28 ENCOUNTER — Encounter: Payer: Medicare Other | Admitting: Physical Therapy

## 2017-03-02 ENCOUNTER — Ambulatory Visit
Admission: RE | Admit: 2017-03-02 | Discharge: 2017-03-02 | Disposition: A | Discharge status: Home / Self Care | Payer: Medicare Other | Source: Ambulatory Visit | Attending: Family Medicine | Admitting: Family Medicine

## 2017-03-02 DIAGNOSIS — M542 Cervicalgia: Secondary | ICD-10-CM

## 2017-03-02 MED ORDER — GADOBENATE DIMEGLUMINE 529 MG/ML IV SOLN
10.0000 mL | Freq: Once | INTRAVENOUS | Status: AC | PRN
  Administered 2017-03-02: 10 mL via INTRAVENOUS

## 2017-03-05 ENCOUNTER — Encounter: Payer: Medicare Other | Admitting: Physical Therapy

## 2017-03-07 ENCOUNTER — Ambulatory Visit: Payer: Medicare Other | Admitting: Physical Therapy

## 2017-03-07 ENCOUNTER — Encounter: Payer: Self-pay | Admitting: Physical Therapy

## 2017-03-07 DIAGNOSIS — M6281 Muscle weakness (generalized): Secondary | ICD-10-CM

## 2017-03-07 DIAGNOSIS — M25511 Pain in right shoulder: Secondary | ICD-10-CM

## 2017-03-07 DIAGNOSIS — M62830 Muscle spasm of back: Secondary | ICD-10-CM

## 2017-03-07 DIAGNOSIS — M542 Cervicalgia: Secondary | ICD-10-CM

## 2017-03-07 DIAGNOSIS — R2689 Other abnormalities of gait and mobility: Secondary | ICD-10-CM

## 2017-03-07 DIAGNOSIS — R293 Abnormal posture: Secondary | ICD-10-CM

## 2017-03-07 NOTE — Therapy (Signed)
[facility] [address], [place], [place], [address] Phone: [phone]   Fax:  [phone]  Physical Therapy Treatment  Patient Details  Name: Jennifer Green MRN: [id number] Date of Birth: 29-Sep-1940 Referring Provider: Betty Martinique, MD    Encounter Date: 03/07/2017  PT End of Session - 03/07/17 0924    Visit Number  7    Number of Visits  16    Date for PT Re-Evaluation  03/27/17    Authorization Type  Medicare A and B     PT Start Time  0924    PT Stop Time  1020    PT Time Calculation (min)  56 min    Activity Tolerance  Patient tolerated treatment well    Behavior During Therapy  [facility] for tasks assessed/performed       Past Medical History:  Diagnosis Date  . Allergy   . Elevated blood sugar    790.21  . GERD (gastroesophageal reflux disease)   . Glaucoma   . Gout   . Hypertension   . Hypokalemia   . Hypomagnesemia   . Insomnia   . Murmur   . OP (osteoporosis)   . PVC (premature ventricular contraction)     Past Surgical History:  Procedure Laterality Date  . CERVICAL FUSION  05/2010   per Dr. Newman Pies   . EYE SURGERY     right and left    There were no vitals filed for this visit.  Subjective Assessment - 03/07/17 0925    Subjective  Had MRI Sunday. Has not seen MD for results, plans to call today. Had cortisone injection in shoulder from ortho MD.     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  Yes    Pain Score  2     Pain Location  Shoulder    Pain Orientation  Right    Pain Descriptors / Indicators  Sore    Aggravating Factors   Lifting something    Pain Relieving Factors  Shot helped, heat    Multiple Pain Sites  No         [facility] PT Assessment - 03/07/17 0001      AROM   Cervical - Right Rotation  35    Cervical - Left Rotation  35                  [facility] Adult PT Treatment/Exercise - 03/07/17 0001      Shoulder Exercises: Supine    Horizontal ABduction  -- Yellow band 2x 10, diagonals Bil 2x10, head press/chin tuck    External Rotation  Strengthening;Both;20 reps;Theraband    Theraband Level (Shoulder External Rotation)  Level 1 (Yellow)      Shoulder Exercises: ROM/Strengthening   UBE (Upper Arm Bike)  L0 2x2 with pillow folded behind pt, postural emphasis       Moist Heat Therapy   Number Minutes Moist Heat  15 Minutes    Moist Heat Location  -- Cervical      Electrical Stimulation   Electrical Stimulation Location  Rt lateral cervical to shoulder    Electrical Stimulation Action  Pre mod    Electrical Stimulation Parameters  80-150 HZ    Electrical Stimulation Goals  Pain             PT Education - 03/07/17 0940    Education provided  Yes    Education Details  Upper  trap stretch RT for HEP    Person(s) Educated  Patient    Methods  Explanation;Demonstration;Tactile cues;Verbal cues;Handout    Comprehension  Verbalized understanding;Returned demonstration       PT Short Term Goals - 03/07/17 0932      PT SHORT TERM GOAL #2   Title  Pt will report atleast 25% reduction in her pain/stiffness with daily activity from the start of therapy.     Time  4    Period  Weeks    Status  Achieved 60%        PT Long Term Goals - 03/07/17 0933      PT LONG TERM GOAL #2   Title  Pt will report atleast 50% improvement in her pain/symptoms from the start of therapy, to improve her quality of life and activity participation.     Time  8    Period  Weeks    Status  Achieved 60%            Plan - 03/07/17 0924    Clinical Impression Statement  Pt has not been read her MRI results as of today. SHe is doing well with a supine HEP, does not tolerate much seated work. Added cervical stretches to her HEP today which she tolerated in clinic wihtout exacerbation of symptoms. Pt's cervical rotation had not improved much since eval, but his could be due to her recent pain flare ups .     Rehab Potential  Good     PT Frequency  2x / week    PT Duration  8 weeks    PT Treatment/Interventions  ADLs/Self Care Home Management;Cryotherapy;Electrical Stimulation;Moist Heat;Iontophoresis 4mg /ml Dexamethasone;Therapeutic activities;Therapeutic exercise;Patient/family education;Neuromuscular re-education;Passive range of motion;Taping;Dry needling;Manual techniques    PT Next Visit Plan  Pt to call MD office today, review HEP given today.        Patient will benefit from skilled therapeutic intervention in order to improve the following deficits and impairments:  Decreased activity tolerance, Decreased range of motion, Decreased strength, Hypomobility, Impaired UE functional use, Impaired flexibility, Increased muscle spasms, Postural dysfunction, Improper body mechanics, Pain  Visit Diagnosis: Cervicalgia  Right shoulder pain, unspecified chronicity  Muscle spasm of back  Abnormal posture  Other abnormalities of gait and mobility  Muscle weakness (generalized)     Problem List Patient Active Problem List   Diagnosis Date Noted  . Achilles tendon contracture, right 02/13/2016  . Posterior tibial tendinitis, right leg 01/16/2016  . Pain in right ankle and joints of right foot 01/02/2016  . Sinus bradycardia 10/06/2015  . 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 Dyspnea and respiratory abnormality 02/25/2014  . Ataxia 08/12/2013  . Peripheral edema 08/12/2013  . Meningioma ([place]) 04/29/2013  . Hearing loss 03/23/2013  . Hemoptysis 02/23/2013  . PVC's (premature ventricular contractions) 07/15/2011  . Coronary artery calcification seen on CAT scan 03/28/2011  . Lung nodule 12/31/2010  . Chronic cough 12/31/2010  . Hypokalemia 12/05/2010  . Irregular heart rate 12/05/2010  . Insomnia 02/16/2009  . CHEST WALL PAIN, ACUTE 12/30/2008  . [place] DISEASE, CERVICAL 03/24/2007  . GOUT 07/11/2006  . Essential hypertension 07/11/2006  . ALLERGIC RHINITIS 07/11/2006  . GERD 07/11/2006     Makoa Satz, PTA 03/07/2017, 10:05 AM  [facility] [address], [place], [place], [address] Phone: [phone]   Fax:  [phone]  Name: Jennifer Green MRN: [id number] Date of Birth: 1940-08-28

## 2017-03-07 NOTE — Patient Instructions (Signed)
  Flexibility: Upper Trapezius Stretch   Gently grasp right side of head while reaching behind back with other hand. Tilt head away until a gentle stretch is felt. Hold ____ seconds. Repeat ____ times per set. Do ____ sets per session. Do ____ sessions per day.  http://orth.exer.us/340   Levator Stretch   Grasp seat or sit on hand on side to be stretched. Turn head toward other side and look down. Use hand on head to gently stretch neck in that position. Hold ____ seconds. Repeat on other side. Repeat ____ times. Do ____ sessions per day.  http://[email or web address].exer.us/30   Scapular Retraction (Standing)   With arms at sides, pinch shoulder blades together. Repeat ____ times per set. Do ____ sets per session. Do ____ sessions per day.  http://orth.exer.us/944   Flexibility: Neck Retraction   Pull head straight back, keeping eyes and jaw level. Repeat ____ times per set. Do ____ sets per session. Do ____ sessions per day.  http://orth.exer.us/344   Posture - Sitting   Sit upright, head facing forward. Try using a roll to support lower back. Keep shoulders relaxed, and avoid rounded back. Keep hips level with knees. Avoid crossing legs for long periods.   Flexibility: Corner Stretch   Standing in corner with hands just above shoulder level and feet ____ inches from corner, lean forward until a comfortable stretch is felt across chest. Hold ____ seconds. Repeat ____ times per set. Do ____ sets per session. Do ____ sessions per day.  http://orth.exer.us/342   Copyright  VHI. All rights reserved.    

## 2017-03-10 ENCOUNTER — Telehealth: Payer: Self-pay | Admitting: Family Medicine

## 2017-03-10 ENCOUNTER — Other Ambulatory Visit: Payer: Self-pay | Admitting: Family Medicine

## 2017-03-10 DIAGNOSIS — I10 Essential (primary) hypertension: Secondary | ICD-10-CM

## 2017-03-10 NOTE — Telephone Encounter (Signed)
Copied from [place]. Topic: Quick Communication - See Telephone Encounter >> Mar 10, 2017  1:27 PM Ether Griffins B wrote: CRM for notification. See Telephone encounter for:  Pt would like results of MRI on 03/02/17  03/10/17.

## 2017-03-10 NOTE — Telephone Encounter (Signed)
Called and spoke with pt. Pt advised and voiced understanding. Pt stated that she would like referral placed. OK to close TE will send message through pt's lab results.

## 2017-03-12 ENCOUNTER — Encounter: Payer: Self-pay | Admitting: Physical Therapy

## 2017-03-12 ENCOUNTER — Ambulatory Visit: Payer: Medicare Other | Admitting: Physical Therapy

## 2017-03-12 DIAGNOSIS — R2689 Other abnormalities of gait and mobility: Secondary | ICD-10-CM

## 2017-03-12 DIAGNOSIS — M542 Cervicalgia: Secondary | ICD-10-CM

## 2017-03-12 DIAGNOSIS — M25511 Pain in right shoulder: Secondary | ICD-10-CM

## 2017-03-12 DIAGNOSIS — R293 Abnormal posture: Secondary | ICD-10-CM

## 2017-03-12 DIAGNOSIS — M62830 Muscle spasm of back: Secondary | ICD-10-CM

## 2017-03-12 DIAGNOSIS — M6281 Muscle weakness (generalized): Secondary | ICD-10-CM

## 2017-03-12 NOTE — Therapy (Addendum)
[facility] [address] [place], [place], [address] Phone: [phone]   Fax:  [phone]  Physical Therapy Treatment  Patient Details  Name: Jennifer Green MRN: [id number] Date of Birth: August 09, 1940 Referring Provider: Betty Martinique, MD    Encounter Date: 03/12/2017  PT End of Session - 03/12/17 0931    Visit Number  8    Number of Visits  16    Date for PT Re-Evaluation  03/27/17    Authorization Type  Medicare A and B     Authorization Time Period  01/30/17 to 03/27/17    PT Start Time  0925    PT Stop Time  1015    PT Time Calculation (min)  50 min    Activity Tolerance  Patient tolerated treatment well    Behavior During Therapy  [facility] for tasks assessed/performed       Past Medical History:  Diagnosis Date  . Allergy   . Elevated blood sugar    790.21  . GERD (gastroesophageal reflux disease)   . Glaucoma   . Gout   . Hypertension   . Hypokalemia   . Hypomagnesemia   . Insomnia   . Murmur   . OP (osteoporosis)   . PVC (premature ventricular contraction)     Past Surgical History:  Procedure Laterality Date  . CERVICAL FUSION  05/2010   per Dr. Newman Pies   . EYE SURGERY     right and left    There were no vitals filed for this visit.  Subjective Assessment - 03/12/17 0932    Subjective  I have herniated disc in my neck and will follow up with Dr Arnoldo Morale regarding next steps. 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  Yes    Pain Score  7     Pain Location  Neck    Pain Orientation  Right    Pain Descriptors / Indicators  Sore    Aggravating Factors   Lifting     Pain Relieving Factors  heat, meds, rest, Estim    Multiple Pain Sites  No                      [facility] Adult PT Treatment/Exercise - 03/12/17 0001      Shoulder Exercises: Supine   Horizontal ABduction  --  Yellow band 3x 10, diagonals Bil 2x15, head press/chin tuck    External Rotation  Strengthening;Both;20 reps;Theraband 2x15    Theraband Level (Shoulder External Rotation)  Level 1 (Yellow)      Shoulder Exercises: Standing   Other Standing Exercises  Wall pushups 10x      Shoulder Exercises: ROM/Strengthening   UBE (Upper Arm Bike)  L1 3x3 with pillow behind back.       Moist Heat Therapy   Number Minutes Moist Heat  15 Minutes    Moist Heat Location  -- Cervical      Electrical Stimulation   Electrical Stimulation Location  Rt lateral cervical to shoulder    Electrical Stimulation Action  Pre mod    Electrical Stimulation Parameters  80-150 Hz in supine    Electrical Stimulation Goals  Pain             PT Education - 03/12/17 1002    Education provided  Yes  Education Details  Lifting principles to decrease load on cervical    Person(s) Educated  Patient    Methods  Explanation    Comprehension  Verbalized understanding       PT Short Term Goals - 03/07/17 0932      PT SHORT TERM GOAL #2   Title  Pt will report atleast 25% reduction in her pain/stiffness with daily activity from the start of therapy.     Time  4    Period  Weeks    Status  Achieved 60%        PT Long Term Goals - 03/07/17 0933      PT LONG TERM GOAL #2   Title  Pt will report atleast 50% improvement in her pain/symptoms from the start of therapy, to improve her quality of life and activity participation.     Time  8    Period  Weeks    Status  Achieved 60%            Plan - 03/12/17 0931    Clinical Impression Statement  Pt has spoken with MD. She is referred to her previous neuro MD to take a look at her MRI and advise on next steps. She felt pretty good until she helped a friend lift a table. This load almost immediately caused pain in her neck and shoulder. yesterday she had Rt wrist pain but today it is gone. Exerecises were kept fairly similar as she is in pain today, but we  were able to increase her reps slightly. This did not excaerbate her pain. She is awaiting a call from Dr Arnoldo Morale office to make an appointment. Pt reports she is doing her stretches and band exercises daily.     Rehab Potential  Good    PT Frequency  2x / week    PT Duration  8 weeks    PT Treatment/Interventions  ADLs/Self Care Home Management;Cryotherapy;Electrical Stimulation;Moist Heat;Iontophoresis 4mg /ml Dexamethasone;Therapeutic activities;Therapeutic exercise;Patient/family education;Neuromuscular re-education;Passive range of motion;Taping;Dry needling;Manual techniques    PT Next Visit Plan  Add wall push ups to HEP for advancement, see if pt has appt with Dr Arnoldo Morale. Maybe try advancing to red band if pt not in flare up. Pt would like to continue with PT until she sees Dr or until her current order expires on 2/14.  She might benefit from home TENS unit .     Consulted and Agree with Plan of Care  Patient       Patient will benefit from skilled therapeutic intervention in order to improve the following deficits and impairments:  Decreased activity tolerance, Decreased range of motion, Decreased strength, Hypomobility, Impaired UE functional use, Impaired flexibility, Increased muscle spasms, Postural dysfunction, Improper body mechanics, Pain  Visit Diagnosis: Cervicalgia  Right shoulder pain, unspecified chronicity  Muscle spasm of back  Abnormal posture  Other abnormalities of gait and mobility  Muscle weakness (generalized)     Problem List Patient Active Problem List   Diagnosis Date Noted  . Achilles tendon contracture, right 02/13/2016  . Posterior tibial tendinitis, right leg 01/16/2016  . Pain in right ankle and joints of right foot 01/02/2016  . Sinus bradycardia 10/06/2015  . 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 Dyspnea and respiratory abnormality 02/25/2014  . Ataxia 08/12/2013  . Peripheral edema 08/12/2013  . Meningioma ([place]) 04/29/2013  .  Hearing loss 03/23/2013  . Hemoptysis 02/23/2013  . PVC's (premature ventricular contractions) 07/15/2011  . Coronary artery calcification seen on CAT scan  03/28/2011  . Lung nodule 12/31/2010  . Chronic cough 12/31/2010  . Hypokalemia 12/05/2010  . Irregular heart rate 12/05/2010  . Insomnia 02/16/2009  . CHEST WALL PAIN, ACUTE 12/30/2008  . [place] DISEASE, CERVICAL 03/24/2007  . GOUT 07/11/2006  . Essential hypertension 07/11/2006  . ALLERGIC RHINITIS 07/11/2006  . GERD 07/11/2006    COCHRAN,JENNIFER, PTA 03/12/2017, 10:03 AM  [facility] [address] [place], [place], [address] Phone: [phone]   Fax:  [phone]  Name: Jennifer Green MRN: [id number] Date of Birth: 12/27/1940

## 2017-03-14 ENCOUNTER — Ambulatory Visit: Payer: Medicare Other | Attending: Family Medicine | Admitting: Physical Therapy

## 2017-03-14 ENCOUNTER — Encounter: Payer: Self-pay | Admitting: Physical Therapy

## 2017-03-14 DIAGNOSIS — M25511 Pain in right shoulder: Secondary | ICD-10-CM | POA: Insufficient documentation

## 2017-03-14 DIAGNOSIS — M62830 Muscle spasm of back: Secondary | ICD-10-CM | POA: Insufficient documentation

## 2017-03-14 DIAGNOSIS — R293 Abnormal posture: Secondary | ICD-10-CM | POA: Insufficient documentation

## 2017-03-14 DIAGNOSIS — M6281 Muscle weakness (generalized): Secondary | ICD-10-CM | POA: Insufficient documentation

## 2017-03-14 DIAGNOSIS — R2689 Other abnormalities of gait and mobility: Secondary | ICD-10-CM | POA: Diagnosis present

## 2017-03-14 DIAGNOSIS — M542 Cervicalgia: Secondary | ICD-10-CM | POA: Insufficient documentation

## 2017-03-14 NOTE — Therapy (Signed)
[facility] [address] [place], [place], [address] Phone: [phone]   Fax:  [phone]  Physical Therapy Treatment  Patient Details  Name: Jennifer Green MRN: [id number] Date of Birth: Jul 10, 1940 Referring Provider: Betty Martinique, MD    Encounter Date: 03/14/2017  PT End of Session - 03/14/17 1011    Visit Number  9    Number of Visits  16    Date for PT Re-Evaluation  03/27/17    Authorization Type  Medicare A and B     Authorization Time Period  01/30/17 to 03/27/17    PT Start Time  0930    PT Stop Time  1016    PT Time Calculation (min)  46 min    Activity Tolerance  Patient tolerated treatment well;No increased pain    Behavior During Therapy  WFL for tasks assessed/performed       Past Medical History:  Diagnosis Date  . Allergy   . Elevated blood sugar    790.21  . GERD (gastroesophageal reflux disease)   . Glaucoma   . Gout   . Hypertension   . Hypokalemia   . Hypomagnesemia   . Insomnia   . Murmur   . OP (osteoporosis)   . PVC (premature ventricular contraction)     Past Surgical History:  Procedure Laterality Date  . CERVICAL FUSION  05/2010   per Dr. Newman Pies   . EYE SURGERY     right and left    There were no vitals filed for this visit.  Subjective Assessment - 03/14/17 0932    Subjective  Pt reports that things are going well, her shoulder pain is about a 2/10 and her hip is bothering her some as well. Her HEP is going well at home.     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  Yes    Pain Score  2     Pain Location  -- Rt shoulder    Pain Orientation  Right    Pain Descriptors / Indicators  Sore    Pain Type  Chronic pain    Pain Radiating Towards  none     Pain Onset  More than a month ago    Pain Frequency  Intermittent    Aggravating Factors   lifting heavy or light objects     Pain Relieving Factors  heat, meds, rest     Effect of Pain on  Daily Activities  difficulty lifting her dog and cooking                       [facility] Adult PT Treatment/Exercise - 03/14/17 0001      Neck Exercises: Supine   Neck Retraction  15 reps;3 secs;Limitations    Neck Retraction Limitations  gentle, no change in symptoms      Shoulder Exercises: Supine   Other Supine Exercises  horizontal abduction with red TB x15 reps     Other Supine Exercises  shoulder D2 flexion with red TB x15 reps each      Shoulder Exercises: Sidelying   External Rotation  Both;15 reps;Weights    External Rotation Weight (lbs)  1      Shoulder Exercises: ROM/Strengthening   Wall Pushups  10 reps;Limitations    Wall Pushups Limitations  x2 sets, with slight chin tuck encouraged       Shoulder Exercises: Stretch   Other  Shoulder Stretches  Rt pec stretch 3x20 sec hold in doorway       Moist Heat Therapy   Number Minutes Moist Heat  15 Minutes    Moist Heat Location  Cervical;Other (comment) Rt side with Estim      Electrical Stimulation   Electrical Stimulation Location  Rt lateral cervical to shoulder    Electrical Stimulation Action  PreMod    Electrical Stimulation Parameters  80-150 Hz in supine    Electrical Stimulation Goals  Pain             PT Education - 03/14/17 [phone]    Education provided  Yes    Education Details  technique with therex; posture reminders; info for home tens unit purchase    Person(s) Educated  Patient    Methods  Explanation;Verbal cues    Comprehension  Verbalized understanding       PT Short Term Goals - 03/07/17 0932      PT SHORT TERM GOAL #2   Title  Pt will report atleast 25% reduction in her pain/stiffness with daily activity from the start of therapy.     Time  4    Period  Weeks    Status  Achieved 60%        PT Long Term Goals - 03/07/17 0933      PT LONG TERM GOAL #2   Title  Pt will report atleast 50% improvement in her pain/symptoms from the start of therapy, to improve her quality  of life and activity participation.     Time  8    Period  Weeks    Status  Achieved 60%            Plan - 03/14/17 1011    Clinical Impression Statement  Pt arrived with decreased pain following last session, noting 2/10 pain along the Rt shoulder region. Continued with focus on therex to improve cervical strength and postural awareness. Pt was able to complete increased resistance with theraband exercises and reported no increase in pain following this. Will continue to progress flexibility, strengthening and use of manual techniques/modalities to decrease pain and improve alignment.     Rehab Potential  Good    PT Frequency  2x / week    PT Duration  8 weeks    PT Treatment/Interventions  ADLs/Self Care Home Management;Cryotherapy;Electrical Stimulation;Moist Heat;Iontophoresis 4mg /ml Dexamethasone;Therapeutic activities;Therapeutic exercise;Patient/family education;Neuromuscular re-education;Passive range of motion;Taping;Dry needling;Manual techniques    PT Next Visit Plan  F/u on pt ordering home TENS unit; continue with posterior shoulder and gentle neck strengthening therex; Pt would like to continue with PT until she sees Dr or until her current order expires on 2/14.      Consulted and Agree with Plan of Care  Patient       Patient will benefit from skilled therapeutic intervention in order to improve the following deficits and impairments:  Decreased activity tolerance, Decreased range of motion, Decreased strength, Hypomobility, Impaired UE functional use, Impaired flexibility, Increased muscle spasms, Postural dysfunction, Improper body mechanics, Pain  Visit Diagnosis: Cervicalgia  Right shoulder pain, unspecified chronicity  Muscle spasm of back  Abnormal posture     Problem List Patient Active Problem List   Diagnosis Date Noted  . Achilles tendon contracture, right 02/13/2016  . Posterior tibial tendinitis, right leg 01/16/2016  . Pain in right ankle and  joints of right foot 01/02/2016  . Sinus bradycardia 10/06/2015  . 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 Dyspnea and  respiratory abnormality 02/25/2014  . Ataxia 08/12/2013  . Peripheral edema 08/12/2013  . Meningioma ([place]) 04/29/2013  . Hearing loss 03/23/2013  . Hemoptysis 02/23/2013  . PVC's (premature ventricular contractions) 07/15/2011  . Coronary artery calcification seen on CAT scan 03/28/2011  . Lung nodule 12/31/2010  . Chronic cough 12/31/2010  . Hypokalemia 12/05/2010  . Irregular heart rate 12/05/2010  . Insomnia 02/16/2009  . CHEST WALL PAIN, ACUTE 12/30/2008  . [place] DISEASE, CERVICAL 03/24/2007  . GOUT 07/11/2006  . Essential hypertension 07/11/2006  . ALLERGIC RHINITIS 07/11/2006  . GERD 07/11/2006    10:18 AM,03/14/17 Sherol Dade PT, DPT [facility] [address] [place], [place], [address] Phone: [phone]   Fax:  [phone]  Name: ELVIS BOOT MRN: [id number] Date of Birth: 06-24-1940

## 2017-03-19 ENCOUNTER — Ambulatory Visit: Payer: Medicare Other | Admitting: Physical Therapy

## 2017-03-19 DIAGNOSIS — R293 Abnormal posture: Secondary | ICD-10-CM

## 2017-03-19 DIAGNOSIS — M25511 Pain in right shoulder: Secondary | ICD-10-CM

## 2017-03-19 DIAGNOSIS — M6281 Muscle weakness (generalized): Secondary | ICD-10-CM

## 2017-03-19 DIAGNOSIS — R2689 Other abnormalities of gait and mobility: Secondary | ICD-10-CM

## 2017-03-19 DIAGNOSIS — M542 Cervicalgia: Secondary | ICD-10-CM

## 2017-03-19 DIAGNOSIS — M62830 Muscle spasm of back: Secondary | ICD-10-CM

## 2017-03-19 NOTE — Therapy (Signed)
[facility] [address], [place], [place], [address] Phone: [phone]   Fax:  [phone]  Physical Therapy Treatment  Patient Details  Name: Jennifer Green MRN: [id number] Date of Birth: Nov 28, 1940 Referring Provider: Betty Martinique, MD    Encounter Date: 03/19/2017  PT End of Session - 03/19/17 1402    Visit Number  10    Date for PT Re-Evaluation  03/27/17    Authorization Type  Medicare A and B     Authorization Time Period  01/30/17 to 03/27/17    PT Start Time  1400    PT Stop Time  1445    PT Time Calculation (min)  45 min    Activity Tolerance  Patient tolerated treatment well;No increased pain    Behavior During Therapy  WFL for tasks assessed/performed       Past Medical History:  Diagnosis Date  . Allergy   . Elevated blood sugar    790.21  . GERD (gastroesophageal reflux disease)   . Glaucoma   . Gout   . Hypertension   . Hypokalemia   . Hypomagnesemia   . Insomnia   . Murmur   . OP (osteoporosis)   . PVC (premature ventricular contraction)     Past Surgical History:  Procedure Laterality Date  . CERVICAL FUSION  05/2010   per Dr. Newman Pies   . EYE SURGERY     right and left    There were no vitals filed for this visit.  Subjective Assessment - 03/19/17 1359    Subjective  Good days bad days, yesterday was a good day, today not so good. RT arm is acting up. Will see Dr Arnoldo Morale this Friday.    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  -- Rt neck down the arm and under the axilla.     Pain Score  5     Pain Orientation  Right    Pain Descriptors / Indicators  Radiating    Aggravating Factors   might be muscular today?    Pain Relieving Factors  heat, massage, rest    Multiple Pain Sites  No                      [facility] Adult PT Treatment/Exercise - 03/19/17 0001      Moist Heat Therapy   Number Minutes Moist Heat  15 Minutes    Moist Heat  Location  Cervical;Other (comment) Rt side with Scientist, clinical (histocompatibility and immunogenetics) Stimulation Location  Rt lateral cervical to shoulder    Electrical Stimulation Action  Pre mod    Electrical Stimulation Parameters  80-150 HZ in supine    Electrical Stimulation Goals  Pain      Manual Therapy   Soft tissue mobilization  Cervical RT > LT                PT Short Term Goals - 03/07/17 0932      PT SHORT TERM GOAL #2   Title  Pt will report atleast 25% reduction in her pain/stiffness with daily activity from the start of therapy.     Time  4    Period  Weeks    Status  Achieved 60%        PT Long Term Goals - 03/07/17 0933      PT LONG TERM GOAL #2  Title  Pt will report atleast 50% improvement in her pain/symptoms from the start of therapy, to improve her quality of life and activity participation.     Time  8    Period  Weeks    Status  Achieved 60%            Plan - 03/19/17 1403    Clinical Impression Statement  Pt arrived for todays PT session with more pain than she had been experiencing. Her pain is radiating from her neck down the arm and into her hand. She also feels a deep aches in her axilla area. She is scheduled to see Dr Arnoldo Morale this Friday. It is possible her symptoms could be affected today by muscle tightness as her scalenes, SCM, and post cervical muscles were pretty tight upon palpation bilaterally. After sometime working on the soft tissues they softened and she felt better at the end. She is looking into buying her TENS unit.     Rehab Potential  Good    PT Frequency  2x / week    PT Duration  8 weeks    PT Treatment/Interventions  ADLs/Self Care Home Management;Cryotherapy;Electrical Stimulation;Moist Heat;Iontophoresis 4mg /ml Dexamethasone;Therapeutic activities;Therapeutic exercise;Patient/family education;Neuromuscular re-education;Passive range of motion;Taping;Dry needling;Manual techniques    PT Next Visit Plan  See what MD  says    Consulted and Agree with Plan of Care  --       Patient will benefit from skilled therapeutic intervention in order to improve the following deficits and impairments:  Decreased activity tolerance, Decreased range of motion, Decreased strength, Hypomobility, Impaired UE functional use, Impaired flexibility, Increased muscle spasms, Postural dysfunction, Improper body mechanics, Pain  Visit Diagnosis: Cervicalgia  Right shoulder pain, unspecified chronicity  Muscle spasm of back  Abnormal posture  Other abnormalities of gait and mobility  Muscle weakness (generalized)     Problem List Patient Active Problem List   Diagnosis Date Noted  . Achilles tendon contracture, right 02/13/2016  . Posterior tibial tendinitis, right leg 01/16/2016  . Pain in right ankle and joints of right foot 01/02/2016  . Sinus bradycardia 10/06/2015  . 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 Dyspnea and respiratory abnormality 02/25/2014  . Ataxia 08/12/2013  . Peripheral edema 08/12/2013  . Meningioma ([place]) 04/29/2013  . Hearing loss 03/23/2013  . Hemoptysis 02/23/2013  . PVC's (premature ventricular contractions) 07/15/2011  . Coronary artery calcification seen on CAT scan 03/28/2011  . Lung nodule 12/31/2010  . Chronic cough 12/31/2010  . Hypokalemia 12/05/2010  . Irregular heart rate 12/05/2010  . Insomnia 02/16/2009  . CHEST WALL PAIN, ACUTE 12/30/2008  . [place] DISEASE, CERVICAL 03/24/2007  . GOUT 07/11/2006  . Essential hypertension 07/11/2006  . ALLERGIC RHINITIS 07/11/2006  . GERD 07/11/2006    Eddrick Dilone, PTA 03/19/2017, 2:34 PM  [facility] [address] [place], [place], [address] Phone: [phone]   Fax:  [phone]  Name: [name] MRN: [id number] Date of Birth: Aug 11, 1940

## 2017-03-19 NOTE — Addendum Note (Signed)
Addended by: Alysia Penna A on: 03/19/2017 09:42 AM   Modules accepted: Orders

## 2017-03-21 ENCOUNTER — Ambulatory Visit: Payer: Medicare Other | Admitting: Physical Therapy

## 2017-03-21 ENCOUNTER — Encounter: Payer: Self-pay | Admitting: Physical Therapy

## 2017-03-21 DIAGNOSIS — M542 Cervicalgia: Secondary | ICD-10-CM | POA: Diagnosis not present

## 2017-03-21 DIAGNOSIS — M6281 Muscle weakness (generalized): Secondary | ICD-10-CM

## 2017-03-21 DIAGNOSIS — R293 Abnormal posture: Secondary | ICD-10-CM

## 2017-03-21 DIAGNOSIS — M25511 Pain in right shoulder: Secondary | ICD-10-CM

## 2017-03-21 DIAGNOSIS — R2689 Other abnormalities of gait and mobility: Secondary | ICD-10-CM

## 2017-03-21 DIAGNOSIS — M62830 Muscle spasm of back: Secondary | ICD-10-CM

## 2017-03-21 NOTE — Therapy (Addendum)
[facility] [address] [place], [place], [address] Phone: [phone]   Fax:  [phone]  Physical Therapy Treatment/Discharge  Patient Details  Name: Jennifer Green MRN: [id number] Date of Birth: [birth year] Referring Provider: Betty Martinique, MD    Encounter Date: 03/21/2017  PT End of Session - 03/21/17 0916    Visit Number  11    Date for PT Re-Evaluation  03/27/17    Authorization Type  Medicare A and B     Authorization Time Period  01/30/17 to 03/27/17    PT Start Time  0915    PT Stop Time  0945    PT Time Calculation (min)  30 min    Activity Tolerance  Patient tolerated treatment well;No increased pain    Behavior During Therapy  WFL for tasks assessed/performed       Past Medical History:  Diagnosis Date  . Allergy   . Elevated blood sugar    790.21  . GERD (gastroesophageal reflux disease)   . Glaucoma   . Gout   . Hypertension   . Hypokalemia   . Hypomagnesemia   . Insomnia   . Murmur   . OP (osteoporosis)   . PVC (premature ventricular contraction)     Past Surgical History:  Procedure Laterality Date  . CERVICAL FUSION  05/2010   per Dr. Newman Pies   . EYE SURGERY     right and left    There were no vitals filed for this visit.  Subjective Assessment - 03/21/17 0949    Subjective  Pt saw DR Arnoldo Morale this AM. She is going to have a cervical fusion sometime after March.     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  Yes    Pain Score  3     Pain Location  Shoulder    Pain Orientation  Right    Pain Descriptors / Indicators  Dull    Multiple Pain Sites  No         [facility] PT Assessment - 03/21/17 0001      Observation/Other Assessments   Focus on Therapeutic Outcomes (FOTO)   50% limited      AROM   Cervical - Right Rotation  50    Cervical - Left Rotation  35      Strength   Right Shoulder Flexion  4+/5    Right Shoulder ABduction  4/5    Right  Shoulder External Rotation  4-/5    Left Shoulder Flexion  4+/5    Left Shoulder ABduction  4/5    Left Shoulder Internal Rotation  4/5                  [facility] Adult PT Treatment/Exercise - 03/21/17 0001      Self-Care   Self-Care  -- Review of techniques to keep stra.in low on neck             PT Education - 03/21/17 0951    Education provided  Yes    Education Details  Review of HEP and purchasing TENS unit    Person(s) Educated  Patient    Methods  Explanation    Comprehension  Verbalized understanding       PT Short Term Goals - 03/21/17 0946      PT SHORT TERM GOAL #3   Title  Pt will demo improved cervical rotation to atleast 50 deg each direction  without increase in pain, to allow her to turn her head while driving.     Time  4    Period  Weeks    Status  Partially Met see chart        PT Long Term Goals - 03/21/17 0930      PT LONG TERM GOAL #1   Title  Pt will demo improved Rt shoulder strength to atleast 4+/5 MMT, pain free, to allow her to complete daily activity with less difficulty.     Time  8    Period  Weeks    Status  Partially Met See chart      PT LONG TERM GOAL #2   Status  Partially Met Some days this could be true, but this is not consistent      PT LONG TERM GOAL #3   Title  Pt will report 50% less difficulty with cleaning her house throughout the week, to increase her independence.    Time  8    Period  Days    Status  Not Met Not consistently      PT LONG TERM GOAL #4   Title  Pt will be able to lift atleast 10# from the sink to the counter with her RUE without increase in pain or noted difficulty 3/5 trials, to allow her to complete cooking tasks at home without assistance.     Time  8    Period  Weeks    Status  Not Met Pt trying not to lift as this increases her symptoms            Plan - 03/21/17 0916    Clinical Impression Statement  Pt saw Neurosurgeon this AM. She is going to have surgery sometime after  March and would like today to be her last day. She plans to continue with her HEP, purchase a home TENS unit and limit her lifting.  FOTO score did not improve.     Rehab Potential  Good    PT Frequency  2x / week    PT Duration  8 weeks    PT Treatment/Interventions  ADLs/Self Care Home Management;Cryotherapy;Electrical Stimulation;Moist Heat;Iontophoresis 41m/ml Dexamethasone;Therapeutic activities;Therapeutic exercise;Patient/family education;Neuromuscular re-education;Passive range of motion;Taping;Dry needling;Manual techniques    PT Next Visit Plan  DC per pt request. Pt will have surgery, cervical fusion, sometime after March 1st.     Consulted and Agree with Plan of Care  Patient       Patient will benefit from skilled therapeutic intervention in order to improve the following deficits and impairments:  Decreased activity tolerance, Decreased range of motion, Decreased strength, Hypomobility, Impaired UE functional use, Impaired flexibility, Increased muscle spasms, Postural dysfunction, Improper body mechanics, Pain  Visit Diagnosis: Cervicalgia  Right shoulder pain, unspecified chronicity  Muscle spasm of back  Abnormal posture  Other abnormalities of gait and mobility  Muscle weakness (generalized)     Problem List Patient Active Problem List   Diagnosis Date Noted  . Achilles tendon contracture, right 02/13/2016  . Posterior tibial tendinitis, right leg 01/16/2016  . Pain in right ankle and joints of right foot 01/02/2016  . Sinus bradycardia 10/06/2015  . 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 Dyspnea and respiratory abnormality 02/25/2014  . Ataxia 08/12/2013  . Peripheral edema 08/12/2013  . Meningioma ([place] 04/29/2013  . Hearing loss 03/23/2013  . Hemoptysis 02/23/2013  . PVC's (premature ventricular contractions) 07/15/2011  . Coronary artery calcification seen on CAT scan 03/28/2011  . Lung  nodule 12/31/2010  . Chronic cough 12/31/2010  .  Hypokalemia 12/05/2010  . Irregular heart rate 12/05/2010  . Insomnia 02/16/2009  . CHEST WALL PAIN, ACUTE 12/30/2008  . [place] DISEASE, CERVICAL 03/24/2007  . GOUT 07/11/2006  . Essential hypertension 07/11/2006  . ALLERGIC RHINITIS 07/11/2006  . GERD 07/11/2006    Myrene Galas, PTA 03/21/17 9:59 AM  [facility] [address], [place], [place], [address] Phone: [phone]   Fax:  [phone]  Name: Jennifer Green MRN: [id number] Date of Birth: [birth year]  *Addendum to resolve episode and d/c pt from PT   Mom[place]  Visits from Start of Care: 11  Current functional level related to goals / functional outcomes: See above for more details    Remaining deficits: See above for more details    Education / Equipment: See above for more details  Plan: Patient agrees to discharge.  Patient goals were partially met. Patient is being discharged due to a change in medical status.  ?????     Pt made some improvements in shoulder strength and cervical ROM, however she recently saw the neurosurgeon and is scheduled for cervical surgery sometime after March. She is independent with her HEP and plans to purchase home TENS unit.   11:00 AM,03/21/17 [place], [place] [facility] [place]

## 2017-03-25 ENCOUNTER — Ambulatory Visit: Payer: Medicare Other | Admitting: Physical Therapy

## 2017-03-26 ENCOUNTER — Other Ambulatory Visit: Payer: Self-pay | Admitting: Neurosurgery

## 2017-03-27 ENCOUNTER — Ambulatory Visit: Payer: Medicare Other | Admitting: Physical Therapy

## 2017-04-06 ENCOUNTER — Other Ambulatory Visit: Payer: Self-pay | Admitting: Family Medicine

## 2017-04-06 DIAGNOSIS — M109 Gout, unspecified: Secondary | ICD-10-CM

## 2017-04-06 DIAGNOSIS — I10 Essential (primary) hypertension: Secondary | ICD-10-CM

## 2017-04-07 NOTE — Telephone Encounter (Signed)
Dr Fry pt 

## 2017-04-14 ENCOUNTER — Other Ambulatory Visit: Payer: Self-pay | Admitting: Family Medicine

## 2017-04-14 NOTE — Telephone Encounter (Signed)
Copied from [place] [id number]. Topic: Quick Communication - Rx Refill/Question >> Apr 14, 2017 11:43 AM Antonieta Iba C wrote: Medication:  traZODone (DESYREL) 50 MG tablet    Has the patient contacted their pharmacy? Yes - switching pharmacies    (Agent: If no, request that the patient contact the pharmacy for the refill.)  Preferred Pharmacy (with phone number or street name): [facility] on [address]    Agent: Please be advised that RX refills may take up to 3 business days. We ask that you follow-up with your pharmacy.

## 2017-04-15 NOTE — Telephone Encounter (Signed)
LOV: 02/21/17  Dr. Standley Brooking on [address]

## 2017-04-16 MED ORDER — TRAZODONE HCL 50 MG PO TABS: 50.0000 mg | ORAL_TABLET | Freq: Every evening | ORAL | 1 refills | Status: DC | PRN

## 2017-04-16 NOTE — Telephone Encounter (Signed)
Last refilled 01/20/2017 disp 120 with 1 refill   Sent to PCP for approval

## 2017-04-22 ENCOUNTER — Encounter (HOSPITAL_COMMUNITY): Payer: Self-pay

## 2017-04-22 ENCOUNTER — Encounter (HOSPITAL_COMMUNITY)
Admission: RE | Admit: 2017-04-22 | Discharge: 2017-04-22 | Disposition: A | Discharge status: Home / Self Care | Payer: Medicare Other | Source: Ambulatory Visit | Attending: Neurosurgery | Admitting: Neurosurgery

## 2017-04-22 DIAGNOSIS — Z01812 Encounter for preprocedural laboratory examination: Secondary | ICD-10-CM | POA: Insufficient documentation

## 2017-04-22 DIAGNOSIS — M5020 Other cervical disc displacement, unspecified cervical region: Secondary | ICD-10-CM | POA: Diagnosis not present

## 2017-04-22 HISTORY — DX: Unspecified osteoarthritis, unspecified site: M19.90

## 2017-04-22 HISTORY — DX: Dyspnea, unspecified: R06.00

## 2017-04-22 LAB — BASIC METABOLIC PANEL
Anion gap: 12 (ref 5–15)
BUN: 15 mg/dL (ref 6–20)
CHLORIDE: 101 mmol/L (ref 101–111)
CO2: 27 mmol/L (ref 22–32)
Calcium: 10 mg/dL (ref 8.9–10.3)
Creatinine, Ser: 0.9 mg/dL (ref 0.44–1.00)
GFR calc non Af Amer: 59 mL/min — ABNORMAL LOW (ref >60)
Glucose, Bld: 137 mg/dL — ABNORMAL HIGH (ref 65–99)
Potassium: 3.9 mmol/L (ref 3.5–5.1)
Sodium: 140 mmol/L (ref 135–145)

## 2017-04-22 LAB — CBC
HEMATOCRIT: 39.2 % (ref 36.0–46.0)
HEMOGLOBIN: 12.8 g/dL (ref 12.0–15.0)
MCH: 30.4 pg (ref 26.0–34.0)
MCHC: 32.7 g/dL (ref 30.0–36.0)
MCV: 93.1 fL (ref 78.0–100.0)
Platelets: 302 10*3/uL (ref 150–400)
RBC: 4.21 MIL/uL (ref 3.87–5.11)
RDW: 15 % (ref 11.5–15.5)
WBC: 9.3 10*3/uL (ref 4.0–10.5)

## 2017-04-22 LAB — SURGICAL PCR SCREEN
MRSA, PCR: NEGATIVE
STAPHYLOCOCCUS AUREUS: NEGATIVE

## 2017-04-22 LAB — ABO/RH: ABO/RH(D): A POS

## 2017-04-22 MED ORDER — CHLORHEXIDINE GLUCONATE CLOTH 2 % EX PADS: 6.0000 | MEDICATED_PAD | Freq: Once | CUTANEOUS | Status: DC

## 2017-04-22 NOTE — Pre-Procedure Instructions (Signed)
    Jennifer Green  04/22/2017      [place] [address], [place] [address] [place] [place] [address] Phone: [phone] Fax: [phone]    Your procedure is scheduled on Monday, April 28, 2017  Report to [facility] Admitting at 11:55 A.M.  Call this number if you have problems the morning of surgery:  [phone]   Remember: Follow doctors instructions regarding Aspirin  Do not eat food or drink liquids after midnight Sunday, April 27, 2017  Take these medicines the morning of surgery with A SIP OF WATER : allopurinol (ZYLOPRIM), atenolol (TENORMIN), eye drops Stop taking vitamins, fish oil and herbal medications. Do not take any NSAIDs ie: Ibuprofen, Advil, Naproxen (Aleve), Motrin, BC and Goody Powder; stop now.  Do not wear jewelry, make-up or nail polish.  Do not wear lotions, powders, or perfumes, or deodorant.  Do not shave 48 hours prior to surgery.    Do not bring valuables to the hospital.  [facility] is not responsible for any belongings or valuables.  Contacts, dentures or bridgework may not be worn into surgery.  Leave your suitcase in the car.  After surgery it may be brought to your room. For patients admitted to the hospital, discharge time will be determined by your treatment team. Patients discharged the day of surgery will not be allowed to drive home.  Special instructions: See instruction sheet for shower. Please read over the following fact sheets that you were given. Pain Booklet, Coughing and Deep Breathing, Blood Transfusion Information, MRSA Information and Surgical Site Infection Prevention

## 2017-04-22 NOTE — Progress Notes (Signed)
Blood bank called to inform us that patient has antibodies and will need a new type and screen done day of surgery. Order entered.

## 2017-04-23 NOTE — Progress Notes (Signed)
Anesthesia Chart Review:  Pt is a 77 year old female scheduled for C6-7 ACDF, interbody prosthesis, C5-6 removal of old skyline plate on 7/86/7544 Newman Pies, MD  - PCP is Alysia Penna, MD - Cardiologist is Peter Martinique, MD who sees pt for PVCs. Last office visit 11/22/16; 1 year f/u recommended  PMH includes:  HTN, heart murmur ("no murmur" documented by Dr. Martinique for office visit 11/22/16), GERD. Never smoker. BMI 23.5  Medications include: Amlodipine, atenolol, Zantac  BP (!) 153/68   Pulse 68   Temp 37.2 C   Resp 18   Ht 5' (1.524 m)   Wt 119 lb 9.6 oz (54.3 kg)   SpO2 97%   BMI 23.36 kg/m   Preoperative labs reviewed.    CXR 08/28/16: No active cardiopulmonary disease.  EKG 11/22/16: NSR.  Nonspecific T wave abnormality.  Nuclear stress test 11/30/10:  - Stress Myoview with no chest pain and no electrocardiographic changes.  The scintigraphic results show no evidence of ischemia or infarction in any vascular territory. The gated ejection fraction was 88% and wall motion was normal.  If no changes, I anticipate pt can proceed with surgery as scheduled.   Willeen Cass, FNP-BC [facility] Phone: [phone] 04/23/2017 10:58 AM

## 2017-04-28 ENCOUNTER — Ambulatory Visit (HOSPITAL_COMMUNITY): Payer: Medicare Other | Admitting: Certified Registered"

## 2017-04-28 ENCOUNTER — Inpatient Hospital Stay (HOSPITAL_COMMUNITY)
Admission: RE | Admit: 2017-04-28 | Discharge: 2017-04-29 | DRG: 473 | Disposition: A | Discharge status: Home / Self Care | Payer: Medicare Other | Source: Ambulatory Visit | Attending: Neurosurgery | Admitting: Neurosurgery

## 2017-04-28 ENCOUNTER — Other Ambulatory Visit: Payer: Self-pay

## 2017-04-28 ENCOUNTER — Inpatient Hospital Stay (HOSPITAL_COMMUNITY)
Admission: RE | Disposition: A | Discharge status: Home / Self Care | Payer: Self-pay | Source: Ambulatory Visit | Attending: Neurosurgery

## 2017-04-28 ENCOUNTER — Encounter (HOSPITAL_COMMUNITY): Payer: Self-pay | Admitting: General Practice

## 2017-04-28 ENCOUNTER — Ambulatory Visit (HOSPITAL_COMMUNITY): Payer: Medicare Other | Admitting: Emergency Medicine

## 2017-04-28 ENCOUNTER — Ambulatory Visit (HOSPITAL_COMMUNITY): Payer: Medicare Other

## 2017-04-28 DIAGNOSIS — K219 Gastro-esophageal reflux disease without esophagitis: Secondary | ICD-10-CM | POA: Diagnosis present

## 2017-04-28 DIAGNOSIS — M810 Age-related osteoporosis without current pathological fracture: Secondary | ICD-10-CM | POA: Diagnosis present

## 2017-04-28 DIAGNOSIS — H409 Unspecified glaucoma: Secondary | ICD-10-CM | POA: Diagnosis present

## 2017-04-28 DIAGNOSIS — Z886 Allergy status to analgesic agent status: Secondary | ICD-10-CM | POA: Diagnosis not present

## 2017-04-28 DIAGNOSIS — Z888 Allergy status to other drugs, medicaments and biological substances status: Secondary | ICD-10-CM

## 2017-04-28 DIAGNOSIS — Z981 Arthrodesis status: Secondary | ICD-10-CM | POA: Diagnosis not present

## 2017-04-28 DIAGNOSIS — M542 Cervicalgia: Secondary | ICD-10-CM | POA: Diagnosis present

## 2017-04-28 DIAGNOSIS — M47812 Spondylosis without myelopathy or radiculopathy, cervical region: Secondary | ICD-10-CM | POA: Diagnosis present

## 2017-04-28 DIAGNOSIS — Z419 Encounter for procedure for purposes other than remedying health state, unspecified: Secondary | ICD-10-CM

## 2017-04-28 DIAGNOSIS — I10 Essential (primary) hypertension: Secondary | ICD-10-CM | POA: Diagnosis present

## 2017-04-28 DIAGNOSIS — M50123 Cervical disc disorder at C6-C7 level with radiculopathy: Principal | ICD-10-CM | POA: Diagnosis present

## 2017-04-28 DIAGNOSIS — M109 Gout, unspecified: Secondary | ICD-10-CM | POA: Diagnosis present

## 2017-04-28 DIAGNOSIS — M5020 Other cervical disc displacement, unspecified cervical region: Secondary | ICD-10-CM | POA: Diagnosis present

## 2017-04-28 HISTORY — PX: ANTERIOR CERVICAL DECOMP/DISCECTOMY FUSION: SHX1161

## 2017-04-28 LAB — TYPE AND SCREEN
ABO/RH(D): A POS
Antibody Screen: NEGATIVE

## 2017-04-28 SURGERY — ANTERIOR CERVICAL DECOMPRESSION/DISCECTOMY FUSION 1 LEVEL/HARDWARE REMOVAL
Anesthesia: General

## 2017-04-28 MED ORDER — ACETAMINOPHEN 650 MG RE SUPP: 650.0000 mg | RECTAL | Status: DC | PRN

## 2017-04-28 MED ORDER — OXYCODONE HCL 5 MG PO TABS: 5.0000 mg | ORAL_TABLET | ORAL | Status: DC | PRN

## 2017-04-28 MED ORDER — ATENOLOL 12.5 MG HALF TABLET
12.5000 mg | ORAL_TABLET | Freq: Every day | ORAL | Status: DC
  Filled 2017-04-28: qty 1

## 2017-04-28 MED ORDER — BUPIVACAINE-EPINEPHRINE 0.5% -1:200000 IJ SOLN
INTRAMUSCULAR | Status: DC | PRN
  Administered 2017-04-28: 10 mL

## 2017-04-28 MED ORDER — LACTATED RINGERS IV SOLN
INTRAVENOUS | Status: DC | PRN
  Administered 2017-04-28 (×2): via INTRAVENOUS

## 2017-04-28 MED ORDER — APRACLONIDINE HCL 1 % OP SOLN
1.0000 [drp] | Freq: Two times a day (BID) | OPHTHALMIC | Status: DC
  Filled 2017-04-28: qty 5

## 2017-04-28 MED ORDER — CEFAZOLIN SODIUM-DEXTROSE 2-4 GM/100ML-% IV SOLN
2.0000 g | Freq: Three times a day (TID) | INTRAVENOUS | Status: AC
  Administered 2017-04-28 – 2017-04-29 (×2): 2 g via INTRAVENOUS
  Filled 2017-04-28 (×2): qty 100

## 2017-04-28 MED ORDER — GLYCOPYRROLATE 0.2 MG/ML IV SOSY
PREFILLED_SYRINGE | INTRAVENOUS | Status: DC | PRN
  Administered 2017-04-28: .2 mg via INTRAVENOUS

## 2017-04-28 MED ORDER — ZOLPIDEM TARTRATE 5 MG PO TABS: 5.0000 mg | ORAL_TABLET | Freq: Every evening | ORAL | Status: DC | PRN

## 2017-04-28 MED ORDER — OXYCODONE HCL 5 MG PO TABS
10.0000 mg | ORAL_TABLET | ORAL | Status: DC | PRN
  Administered 2017-04-28 – 2017-04-29 (×3): 10 mg via ORAL
  Filled 2017-04-28 (×3): qty 2

## 2017-04-28 MED ORDER — DORZOLAMIDE HCL-TIMOLOL MAL 2-0.5 % OP SOLN: 1.0000 [drp] | Freq: Two times a day (BID) | OPHTHALMIC | Status: DC

## 2017-04-28 MED ORDER — ONDANSETRON HCL 4 MG/2ML IJ SOLN: 4.0000 mg | Freq: Four times a day (QID) | INTRAMUSCULAR | Status: DC | PRN

## 2017-04-28 MED ORDER — BACITRACIN ZINC 500 UNIT/GM EX OINT
TOPICAL_OINTMENT | CUTANEOUS | Status: AC
  Filled 2017-04-28: qty 28.35

## 2017-04-28 MED ORDER — DEXAMETHASONE SODIUM PHOSPHATE 4 MG/ML IJ SOLN
4.0000 mg | Freq: Four times a day (QID) | INTRAMUSCULAR | Status: AC
  Administered 2017-04-28 (×2): 4 mg via INTRAVENOUS
  Filled 2017-04-28 (×2): qty 1

## 2017-04-28 MED ORDER — LACTATED RINGERS IV SOLN: INTRAVENOUS | Status: DC

## 2017-04-28 MED ORDER — ONDANSETRON HCL 4 MG/2ML IJ SOLN
INTRAMUSCULAR | Status: DC | PRN
  Administered 2017-04-28: 4 mg via INTRAVENOUS

## 2017-04-28 MED ORDER — FENTANYL CITRATE (PF) 250 MCG/5ML IJ SOLN
INTRAMUSCULAR | Status: DC | PRN
  Administered 2017-04-28: 50 ug via INTRAVENOUS
  Administered 2017-04-28 (×2): 75 ug via INTRAVENOUS
  Administered 2017-04-28: 50 ug via INTRAVENOUS

## 2017-04-28 MED ORDER — ONDANSETRON HCL 4 MG PO TABS: 4.0000 mg | ORAL_TABLET | Freq: Four times a day (QID) | ORAL | Status: DC | PRN

## 2017-04-28 MED ORDER — MENTHOL 3 MG MT LOZG
1.0000 | LOZENGE | OROMUCOSAL | Status: DC | PRN
  Filled 2017-04-28: qty 9

## 2017-04-28 MED ORDER — PROPOFOL 10 MG/ML IV BOLUS
INTRAVENOUS | Status: DC | PRN
  Administered 2017-04-28: 130 mg via INTRAVENOUS

## 2017-04-28 MED ORDER — CEFAZOLIN SODIUM-DEXTROSE 2-4 GM/100ML-% IV SOLN
INTRAVENOUS | Status: AC
  Filled 2017-04-28: qty 100

## 2017-04-28 MED ORDER — LATANOPROST 0.005 % OP SOLN
1.0000 [drp] | Freq: Every day | OPHTHALMIC | Status: DC
  Administered 2017-04-28: 1 [drp] via OPHTHALMIC
  Filled 2017-04-28: qty 2.5

## 2017-04-28 MED ORDER — ACETAMINOPHEN 500 MG PO TABS
1000.0000 mg | ORAL_TABLET | Freq: Four times a day (QID) | ORAL | Status: DC
  Administered 2017-04-28 – 2017-04-29 (×3): 1000 mg via ORAL
  Filled 2017-04-28 (×3): qty 2

## 2017-04-28 MED ORDER — MORPHINE SULFATE (PF) 4 MG/ML IV SOLN: 4.0000 mg | INTRAVENOUS | Status: DC | PRN

## 2017-04-28 MED ORDER — LACTATED RINGERS IV SOLN
INTRAVENOUS | Status: DC
Start: 2017-04-28 — End: 2017-04-29
  Administered 2017-04-28: 13:00:00 via INTRAVENOUS

## 2017-04-28 MED ORDER — MAGNESIUM OXIDE 400 (241.3 MG) MG PO TABS: 400.0000 mg | ORAL_TABLET | ORAL | Status: DC

## 2017-04-28 MED ORDER — PROPOFOL 10 MG/ML IV BOLUS
INTRAVENOUS | Status: AC
  Filled 2017-04-28: qty 20

## 2017-04-28 MED ORDER — DORZOLAMIDE HCL 2 % OP SOLN
1.0000 [drp] | Freq: Two times a day (BID) | OPHTHALMIC | Status: DC
  Administered 2017-04-28: 1 [drp] via OPHTHALMIC
  Filled 2017-04-28: qty 10

## 2017-04-28 MED ORDER — DEXAMETHASONE SODIUM PHOSPHATE 10 MG/ML IJ SOLN
INTRAMUSCULAR | Status: DC | PRN
  Administered 2017-04-28: 10 mg via INTRAVENOUS

## 2017-04-28 MED ORDER — PANTOPRAZOLE SODIUM 40 MG PO TBEC
40.0000 mg | DELAYED_RELEASE_TABLET | Freq: Every day | ORAL | Status: DC
  Administered 2017-04-28: 40 mg via ORAL
  Filled 2017-04-28: qty 1

## 2017-04-28 MED ORDER — TIMOLOL MALEATE 0.5 % OP SOLN
1.0000 [drp] | Freq: Two times a day (BID) | OPHTHALMIC | Status: DC
  Administered 2017-04-28: 1 [drp] via OPHTHALMIC
  Filled 2017-04-28: qty 5

## 2017-04-28 MED ORDER — THROMBIN (RECOMBINANT) 5000 UNITS EX SOLR
OROMUCOSAL | Status: DC | PRN
  Administered 2017-04-28: 13:00:00 via TOPICAL

## 2017-04-28 MED ORDER — 0.9 % SODIUM CHLORIDE (POUR BTL) OPTIME
TOPICAL | Status: DC | PRN
  Administered 2017-04-28: 1000 mL

## 2017-04-28 MED ORDER — DEXAMETHASONE 4 MG PO TABS: 4.0000 mg | ORAL_TABLET | Freq: Four times a day (QID) | ORAL | Status: AC

## 2017-04-28 MED ORDER — DOCUSATE SODIUM 100 MG PO CAPS
100.0000 mg | ORAL_CAPSULE | Freq: Two times a day (BID) | ORAL | Status: DC
  Administered 2017-04-28: 100 mg via ORAL
  Filled 2017-04-28: qty 1

## 2017-04-28 MED ORDER — PANTOPRAZOLE SODIUM 40 MG IV SOLR: 40.0000 mg | Freq: Every day | INTRAVENOUS | Status: DC

## 2017-04-28 MED ORDER — BISACODYL 10 MG RE SUPP: 10.0000 mg | Freq: Every day | RECTAL | Status: DC | PRN

## 2017-04-28 MED ORDER — ALLOPURINOL 300 MG PO TABS
300.0000 mg | ORAL_TABLET | Freq: Every day | ORAL | Status: DC
  Filled 2017-04-28: qty 1

## 2017-04-28 MED ORDER — FAMOTIDINE 20 MG PO TABS
20.0000 mg | ORAL_TABLET | Freq: Every day | ORAL | Status: DC
  Administered 2017-04-28: 20 mg via ORAL
  Filled 2017-04-28: qty 1

## 2017-04-28 MED ORDER — THROMBIN 5000 UNITS EX SOLR
CUTANEOUS | Status: AC
  Filled 2017-04-28: qty 5000

## 2017-04-28 MED ORDER — ROCURONIUM BROMIDE 10 MG/ML (PF) SYRINGE
PREFILLED_SYRINGE | INTRAVENOUS | Status: DC | PRN
  Administered 2017-04-28: 20 mg via INTRAVENOUS
  Administered 2017-04-28: 10 mg via INTRAVENOUS
  Administered 2017-04-28: 50 mg via INTRAVENOUS

## 2017-04-28 MED ORDER — BACITRACIN ZINC 500 UNIT/GM EX OINT
TOPICAL_OINTMENT | CUTANEOUS | Status: DC | PRN
  Administered 2017-04-28: 1 via TOPICAL

## 2017-04-28 MED ORDER — FENTANYL CITRATE (PF) 250 MCG/5ML IJ SOLN
INTRAMUSCULAR | Status: AC
  Filled 2017-04-28: qty 5

## 2017-04-28 MED ORDER — PRESERVISION AREDS PO CAPS: 1.0000 | ORAL_CAPSULE | Freq: Every day | ORAL | Status: DC

## 2017-04-28 MED ORDER — ACETAMINOPHEN 325 MG PO TABS: 650.0000 mg | ORAL_TABLET | ORAL | Status: DC | PRN

## 2017-04-28 MED ORDER — PILOCARPINE HCL 4 % OP SOLN
1.0000 [drp] | Freq: Two times a day (BID) | OPHTHALMIC | Status: DC
  Administered 2017-04-28: 1 [drp] via OPHTHALMIC
  Filled 2017-04-28: qty 15

## 2017-04-28 MED ORDER — CEFAZOLIN SODIUM-DEXTROSE 2-4 GM/100ML-% IV SOLN
2.0000 g | INTRAVENOUS | Status: AC
  Administered 2017-04-28: 2 g via INTRAVENOUS

## 2017-04-28 MED ORDER — TRAZODONE HCL 100 MG PO TABS
100.0000 mg | ORAL_TABLET | Freq: Every evening | ORAL | Status: DC | PRN
  Filled 2017-04-28: qty 1

## 2017-04-28 MED ORDER — PROSIGHT PO TABS
1.0000 | ORAL_TABLET | Freq: Every day | ORAL | Status: DC
  Filled 2017-04-28: qty 1

## 2017-04-28 MED ORDER — ALUM & MAG HYDROXIDE-SIMETH 200-200-20 MG/5ML PO SUSP: 30.0000 mL | Freq: Four times a day (QID) | ORAL | Status: DC | PRN

## 2017-04-28 MED ORDER — LIDOCAINE 2% (20 MG/ML) 5 ML SYRINGE
INTRAMUSCULAR | Status: DC | PRN
  Administered 2017-04-28: 50 mg via INTRAVENOUS

## 2017-04-28 MED ORDER — SUGAMMADEX SODIUM 200 MG/2ML IV SOLN
INTRAVENOUS | Status: DC | PRN
  Administered 2017-04-28: 200 mg via INTRAVENOUS

## 2017-04-28 MED ORDER — CYCLOBENZAPRINE HCL 10 MG PO TABS
10.0000 mg | ORAL_TABLET | Freq: Three times a day (TID) | ORAL | Status: DC | PRN
  Administered 2017-04-28: 10 mg via ORAL
  Filled 2017-04-28: qty 1

## 2017-04-28 MED ORDER — MAGNESIUM 400 MG PO CAPS: 400.0000 mg | ORAL_CAPSULE | ORAL | Status: DC

## 2017-04-28 MED ORDER — SODIUM CHLORIDE 0.9 % IR SOLN
Status: DC | PRN
  Administered 2017-04-28: 13:00:00

## 2017-04-28 MED ORDER — PHENOL 1.4 % MT LIQD: 1.0000 | OROMUCOSAL | Status: DC | PRN

## 2017-04-28 MED ORDER — AMLODIPINE BESYLATE 5 MG PO TABS
5.0000 mg | ORAL_TABLET | Freq: Every day | ORAL | Status: DC
  Administered 2017-04-28: 5 mg via ORAL
  Filled 2017-04-28: qty 1

## 2017-04-28 MED ORDER — EPHEDRINE SULFATE-NACL 50-0.9 MG/10ML-% IV SOSY
PREFILLED_SYRINGE | INTRAVENOUS | Status: DC | PRN
  Administered 2017-04-28 (×2): 10 mg via INTRAVENOUS

## 2017-04-28 MED ORDER — BUPIVACAINE-EPINEPHRINE (PF) 0.5% -1:200000 IJ SOLN
INTRAMUSCULAR | Status: AC
  Filled 2017-04-28: qty 30

## 2017-04-28 SURGICAL SUPPLY — 65 items
APL SKNCLS STERI-STRIP NONHPOA (GAUZE/BANDAGES/DRESSINGS) ×1
BAG DECANTER FOR FLEXI CONT (MISCELLANEOUS) ×3 IMPLANT
BENZOIN TINCTURE PRP APPL 2/3 (GAUZE/BANDAGES/DRESSINGS) ×3 IMPLANT
BIT DRILL NEURO 2X3.1 SFT TUCH (MISCELLANEOUS) ×2 IMPLANT
BLADE SURG 15 STRL LF DISP TIS (BLADE) ×1 IMPLANT
BLADE SURG 15 STRL SS (BLADE) ×3
BLADE ULTRA TIP 2M (BLADE) ×3 IMPLANT
BUR BARREL STRAIGHT FLUTE 4.0 (BURR) ×3 IMPLANT
BUR MATCHSTICK NEURO 3.0 LAGG (BURR) ×5 IMPLANT
CANISTER SUCT 3000ML PPV (MISCELLANEOUS) ×3 IMPLANT
CARTRIDGE OIL MAESTRO DRILL (MISCELLANEOUS) ×1 IMPLANT
CLOSURE WOUND 1/2 X4 (GAUZE/BANDAGES/DRESSINGS) ×1
COVER MAYO STAND STRL (DRAPES) ×3 IMPLANT
DECANTER SPIKE VIAL GLASS SM (MISCELLANEOUS) ×3 IMPLANT
DIFFUSER DRILL AIR PNEUMATIC (MISCELLANEOUS) ×3 IMPLANT
DRAPE LAPAROTOMY 100X72 PEDS (DRAPES) ×3 IMPLANT
DRAPE MICROSCOPE LEICA (MISCELLANEOUS) IMPLANT
DRAPE POUCH INSTRU U-SHP 10X18 (DRAPES) ×3 IMPLANT
DRAPE SURG 17X23 STRL (DRAPES) ×6 IMPLANT
DRILL NEURO 2X3.1 SOFT TOUCH (MISCELLANEOUS) ×6
DRSG OPSITE POSTOP 4X6 (GAUZE/BANDAGES/DRESSINGS) ×2 IMPLANT
ELECT REM PT RETURN 9FT ADLT (ELECTROSURGICAL) ×3
ELECTRODE REM PT RTRN 9FT ADLT (ELECTROSURGICAL) ×1 IMPLANT
GAUZE SPONGE 4X4 12PLY STRL (GAUZE/BANDAGES/DRESSINGS) ×3 IMPLANT
GAUZE SPONGE 4X4 16PLY XRAY LF (GAUZE/BANDAGES/DRESSINGS) IMPLANT
GLOVE BIO SURGEON STRL SZ8 (GLOVE) ×3 IMPLANT
GLOVE BIO SURGEON STRL SZ8.5 (GLOVE) ×3 IMPLANT
GLOVE BIOGEL PI IND STRL 7.0 (GLOVE) ×3 IMPLANT
GLOVE BIOGEL PI IND STRL 8 (GLOVE) IMPLANT
GLOVE BIOGEL PI INDICATOR 7.0 (GLOVE) ×6
GLOVE BIOGEL PI INDICATOR 8 (GLOVE) ×2
GLOVE EXAM NITRILE LRG STRL (GLOVE) IMPLANT
GLOVE EXAM NITRILE XL STR (GLOVE) IMPLANT
GLOVE EXAM NITRILE XS STR PU (GLOVE) IMPLANT
GOWN STRL REUS W/ TWL LRG LVL3 (GOWN DISPOSABLE) IMPLANT
GOWN STRL REUS W/ TWL XL LVL3 (GOWN DISPOSABLE) ×1 IMPLANT
GOWN STRL REUS W/TWL LRG LVL3 (GOWN DISPOSABLE)
GOWN STRL REUS W/TWL XL LVL3 (GOWN DISPOSABLE) ×6
HEMOSTAT POWDER KIT SURGIFOAM (HEMOSTASIS) ×3 IMPLANT
HEMOSTAT POWDER SURGIFOAM 1G (HEMOSTASIS) ×2 IMPLANT
KIT BASIN OR (CUSTOM PROCEDURE TRAY) ×3 IMPLANT
KIT ROOM TURNOVER OR (KITS) ×3 IMPLANT
MARKER SKIN DUAL TIP RULER LAB (MISCELLANEOUS) ×3 IMPLANT
NDL SPNL 18GX3.5 QUINCKE PK (NEEDLE) ×1 IMPLANT
NEEDLE HYPO 22GX1.5 SAFETY (NEEDLE) ×3 IMPLANT
NEEDLE SPNL 18GX3.5 QUINCKE PK (NEEDLE) ×3 IMPLANT
NS IRRIG 1000ML POUR BTL (IV SOLUTION) ×3 IMPLANT
OIL CARTRIDGE MAESTRO DRILL (MISCELLANEOUS) ×3
PACK LAMINECTOMY NEURO (CUSTOM PROCEDURE TRAY) ×3 IMPLANT
PEEK S VISTA 7X11X14 (Peek) ×3 IMPLANT
PIN DISTRACTION 14MM (PIN) ×6 IMPLANT
PLATE ONE LEVEL SKYLINE 16MM (Plate) ×3 IMPLANT
PUTTY BIOACTIVE 2CC KINEX (Miscellaneous) ×3 IMPLANT
RUBBERBAND STERILE (MISCELLANEOUS) IMPLANT
SCREW SKYLINE VARIABLE LG (Screw) ×9 IMPLANT
SCREW VARIABLE SELF TAP 12MM (Screw) ×6 IMPLANT
SPONGE INTESTINAL PEANUT (DISPOSABLE) ×6 IMPLANT
SPONGE SURGIFOAM ABS GEL SZ50 (HEMOSTASIS) IMPLANT
STRIP CLOSURE SKIN 1/2X4 (GAUZE/BANDAGES/DRESSINGS) ×2 IMPLANT
SUT VIC AB 0 CT1 27 (SUTURE) ×3
SUT VIC AB 0 CT1 27XBRD ANTBC (SUTURE) ×1 IMPLANT
SUT VIC AB 3-0 SH 8-18 (SUTURE) ×3 IMPLANT
TOWEL GREEN STERILE (TOWEL DISPOSABLE) ×3 IMPLANT
TOWEL GREEN STERILE FF (TOWEL DISPOSABLE) ×3 IMPLANT
WATER STERILE IRR 1000ML POUR (IV SOLUTION) ×3 IMPLANT

## 2017-04-28 NOTE — H&P (Signed)
Subjective: The patient is a 77 year old white female on whom I previously performed a C5-6 anterior cervical discectomy, fusion and plating.  She has developed recurrent right neck and arm pain.  She has failed medical management.  She was worked up with a cervical MRI.  This demonstrated a herniated disc, spondylosis at C6-7.  I discussed the various treatment options with the patient.  She has decided proceed with surgery.  Past Medical History:  Diagnosis Date  . Allergy   . Arthritis   . Dyspnea   . Elevated blood sugar    790.21  . GERD (gastroesophageal reflux disease)   . Glaucoma   . Gout   . Hypertension   . Hypokalemia   . Hypomagnesemia   . Insomnia   . Murmur   . OP (osteoporosis)   . PVC (premature ventricular contraction)     Past Surgical History:  Procedure Laterality Date  . BACK SURGERY    . CERVICAL FUSION  05/2010   per Dr. Newman Pies   . EYE SURGERY     right and left    Allergies  Allergen Reactions  . Indomethacin     Rapid Heart Beat  . Promethazine Hcl Other (See Comments)    Couldn't stay still    Social History   Tobacco Use  . Smoking status: Never Smoker  . Smokeless tobacco: Never Used  Substance Use Topics  . Alcohol use: No    Family History  Problem Relation Age of Onset  . Pneumonia Mother   . Hypertension Mother   . Heart disease Father    Prior to Admission medications   Medication Sig Start Date End Date Taking? Authorizing Provider  allopurinol (ZYLOPRIM) 300 MG tablet TAKE ONE TABLET BY MOUTH ONCE DAILY 04/07/17  Yes Dorena Cookey, MD  amLODipine (NORVASC) 5 MG tablet TAKE 1 TABLET BY MOUTH  DAILY Patient taking differently: TAKE 1 TABLET BY MOUTH  DAILY AT BEDTIME. 01/20/17  Yes Martinique, Betty G, MD  apraclonidine (IOPIDINE) 0.5 % ophthalmic solution Place 1 drop into both eyes 2 (two) times daily.  12/15/14  Yes [provider]  Ascorbic Acid (VITAMIN C) 1000 MG tablet Take 1,000 mg by mouth daily.   Yes  [provider]  atenolol (TENORMIN) 25 MG tablet TAKE ONE-HALF TABLET BY  MOUTH DAILY Patient taking differently: Take 12.5 mg by mouth daily. TAKE ONE-HALF TABLET BY  MOUTH DAILY 03/10/17  Yes [place], Betty G, MD  Calcium Carb-Cholecalciferol (CALCIUM 600+D3 PO) Take 1 tablet by mouth daily.   Yes [provider]  dorzolamide-timolol (COSOPT) 22.3-6.8 MG/ML ophthalmic solution Place 1 drop into both eyes 2 (two) times daily. 05/23/14  Yes [provider]  ibuprofen (ADVIL,MOTRIN) 200 MG tablet Take 200-400 mg by mouth every 8 (eight) hours as needed (for pain).   Yes [provider]  latanoprost (XALATAN) 0.005 % ophthalmic solution Place 1 drop into both eyes at bedtime.  03/26/10  Yes [provider]  Magnesium 400 MG CAPS Take 400 mg by mouth every other day. In the morning   Yes [provider]  Multiple Vitamins-Minerals (PRESERVISION AREDS) CAPS Take 1 capsule by mouth daily.   Yes [provider]  pilocarpine (PILOCAR) 4 % ophthalmic solution Place 1 drop into both eyes 2 (two) times daily. 05/02/14  Yes [provider]  ranitidine (ZANTAC) 300 MG tablet TAKE ONE TABLET BY MOUTH AT BEDTIME 04/07/17  Yes Dorena Cookey, MD  traZODone (DESYREL) 50 MG  tablet Take 1-2 tablets (50-100 mg total) by mouth at bedtime as needed. 04/16/17  Yes [name], MD  tiZANidine (ZANAFLEX) 2 MG tablet Take 1 tablet (2 mg total) by mouth every 8 (eight) hours as needed for muscle spasms. Patient not taking: Reported on 04/14/2017 01/28/17   [place], Betty G, MD     Review of Systems  Positive ROS: As above  All other systems have been reviewed and were otherwise negative with the exception of those mentioned in the HPI and as above.  Objective: Vital signs in last 24 hours: Temp:  [97.5 F (36.4 C)] 97.5 F (36.4 C) (03/18 1157) Pulse Rate:  [65] 65 (03/18 1157) Resp:  [20] 20 (03/18 1157) BP: (178)/(78) 178/78 (03/18 1157) SpO2:   [99 %] 99 % (03/18 1157) Weight:  [54.3 kg (119 lb 9.6 oz)] 54.3 kg (119 lb 9.6 oz) (03/18 1157) Estimated body mass index is 23.36 kg/m as calculated from the following:   Height as of this encounter: 5' (1.524 m).   Weight as of this encounter: 54.3 kg (119 lb 9.6 oz).   General Appearance: Alert Head: Normocephalic, without obvious abnormality, atraumatic Eyes: PERRL, conjunctiva/corneas clear, EOM's intact,    Ears: Normal  Throat: Normal  Neck: Supple, her left anterior cervical incision is well-healed.  She has limited cervical range of motion. Back: unremarkable Lungs: Clear to auscultation bilaterally, respirations unlabored Heart: Regular rate and rhythm, no murmur, rub or gallop Abdomen: Soft, non-tender Extremities: Extremities normal, atraumatic, no cyanosis or edema Skin: unremarkable  NEUROLOGIC:   Mental status: alert and oriented,Motor Exam - grossly normal Sensory Exam - grossly normal Reflexes:  Coordination - grossly normal Gait - grossly normal Balance - grossly normal Cranial Nerves: I: smell Not tested  II: visual acuity  OS: Normal  OD: Normal   II: visual fields Full to confrontation  II: pupils Equal, round, reactive to light  III,VII: ptosis None  III,IV,VI: extraocular muscles  Full ROM  V: mastication Normal  V: facial light touch sensation  Normal  V,VII: corneal reflex  Present  VII: facial muscle function - upper  Normal  VII: facial muscle function - lower Normal  VIII: hearing Not tested  IX: soft palate elevation  Normal  IX,X: gag reflex Present  XI: trapezius strength  5/5  XI: sternocleidomastoid strength 5/5  XI: neck flexion strength  5/5  XII: tongue strength  Normal    Data Review Lab Results  Component Value Date   WBC 9.3 04/22/2017   HGB 12.8 04/22/2017   HCT 39.2 04/22/2017   MCV 93.1 04/22/2017   PLT 302 04/22/2017   Lab Results  Component Value Date   NA 140 04/22/2017   K 3.9 04/22/2017   CL 101 04/22/2017    CO2 27 04/22/2017   BUN 15 04/22/2017   CREATININE 0.90 04/22/2017   GLUCOSE 137 (H) 04/22/2017   No results found for: INR, PROTIME  Assessment/Plan: C6-7 herniated disc, cervicalgia, cervical radiculopathy: I have discussed the situation with the patient.  I have reviewed her imaging studies with her and pointed out the abnormalities.  We have discussed the various treatment options including surgery.  I have described the surgical treatment option of a expiration of role fusion/removal of old plate and a B7-6 anterior cervical discectomy, fusion and plating.  I have shown her surgical models.  I have given her a surgical pamphlet.  We have discussed the risks, benefits, alternatives, expected postoperative course, and likelihood of achieving  our goals with surgery.  I have answered all her questions.  She has decided to proceed with surgery.   Ophelia Charter 04/28/2017 1:15 PM

## 2017-04-28 NOTE — Anesthesia Preprocedure Evaluation (Addendum)
Anesthesia Evaluation  Patient identified by MRN, date of birth, ID band Patient awake    Reviewed: Allergy & Precautions, NPO status , Patient's Chart, lab work & pertinent test results  Airway Mallampati: II  TM Distance: >3 FB Neck ROM: Full    Dental  (+) Teeth Intact, Dental Advisory Given   Pulmonary neg pulmonary ROS,    breath sounds clear to auscultation       Cardiovascular hypertension, + CAD  + Valvular Problems/Murmurs  Rhythm:Regular Rate:Normal     Neuro/Psych negative neurological ROS  negative psych ROS   GI/Hepatic Neg liver ROS, GERD  ,  Endo/Other  negative endocrine ROS  Renal/GU negative Renal ROS     Musculoskeletal  (+) Arthritis , Osteoarthritis,    Abdominal Normal abdominal exam  (+)   Peds  Hematology negative hematology ROS (+)   Anesthesia Other Findings Pt denies numbness/tingling with neck extension   Reproductive/Obstetrics                            Anesthesia Physical Anesthesia Plan  ASA: II  Anesthesia Plan: General   Post-op Pain Management:    Induction: Intravenous  PONV Risk Score and Plan: 4 or greater and 3 and Ondansetron, Dexamethasone and Treatment may vary due to age or medical condition  Airway Management Planned: Oral ETT and Video Laryngoscope Planned  Additional Equipment: None  Intra-op Plan:   Post-operative Plan: Extubation in OR  Informed Consent: I have reviewed the patients History and Physical, chart, labs and discussed the procedure including the risks, benefits and alternatives for the proposed anesthesia with the patient or authorized representative who has indicated his/her understanding and acceptance.   Dental advisory given  Plan Discussed with: CRNA  Anesthesia Plan Comments:         Anesthesia Quick Evaluation

## 2017-04-28 NOTE — Anesthesia Procedure Notes (Signed)
Procedure Name: Intubation Date/Time: 04/28/2017 1:57 PM Performed by: Teressa Lower., CRNA Pre-anesthesia Checklist: Patient identified, Emergency Drugs available, Suction available and Patient being monitored Patient Re-evaluated:Patient Re-evaluated prior to induction Oxygen Delivery Method: Circle system utilized Preoxygenation: Pre-oxygenation with 100% oxygen Induction Type: IV induction Ventilation: Mask ventilation without difficulty Laryngoscope Size: Glidescope and 3 Grade View: Grade I Tube type: Oral Tube size: 7.0 mm Number of attempts: 1 Airway Equipment and Method: Stylet,  Oral airway and Video-laryngoscopy Placement Confirmation: ETT inserted through vocal cords under direct vision,  positive ETCO2 and breath sounds checked- equal and bilateral Secured at: 21 cm Tube secured with: Tape Dental Injury: Teeth and Oropharynx as per pre-operative assessment

## 2017-04-28 NOTE — Op Note (Signed)
Brief history: The patient is a 77 year old white female on whom I performed a C5-6 anterior cervical discectomy, fusion and plating years ago.  She has developed right neck and arm pain consistent with a cervical radiculopathy.  She has failed medical management and was worked up with a cervical MRI.  This demonstrated a herniated disc at C6-7.  I discussed the various treatment options with the patient including surgery.  She has weighed the risks, benefits, and alternatives of surgery and desire to proceed with a C6-7 anterior cervical discectomy, fusion and plating and removal of old plate at A6-3.  Preoperative diagnosis: C6-7 herniated disc, cervicalgia, cervical radiculopathy  Postoperative diagnosis: The same  Procedure: C6-7 anterior cervical discectomy/decompression; C6-7 interbody arthrodesis with local morcellized autograft bone and Kinnex bone graft extender; insertion of interbody prosthesis at C6-7 (Zimmer peek interbody prosthesis); anterior cervical plating from C6-7 with globus titanium plate; exploration of cervical fusion/removal of old instrumentation at C5-6  Surgeon: Dr. Earle Gell  Asst.: Dr. Christella Noa  Anesthesia: Gen. endotracheal  Estimated blood loss: 75 cc  Drains: None  Complications: None  Description of procedure: The patient was brought to the operating room by the anesthesia team. General endotracheal anesthesia was induced. A roll was placed under the patient's shoulders to keep the neck in the neutral position. The patient's anterior cervical region was then prepared with Betadine scrub and Betadine solution. Sterile drapes were applied.  The area to be incised was then injected with Marcaine with epinephrine solution. I then used a scalpel to make a transverse incision in the patient's left anterior neck, incising through her old surgical scar on the left. I used the Metzenbaum scissors to divide the platysmal muscle and then to dissect medial to the  sternocleidomastoid muscle, jugular vein, and carotid artery. I carefully dissected down through the scar tissue and towards the anterior cervical spine identifying the esophagus and retracting it medially. Then using Kitner swabs to clear soft tissue from the anterior cervical spine and to expose the old plate at K1-6.  I then used electrocautery to detach the medial border of the longus colli muscle bilaterally from the C5-6 and C6-7 intervertebral disc spaces. I then inserted the Caspar self-retaining retractor underneath the longus colli muscle bilaterally to provide exposure.  I explored the old fusion by unlocking the cams from these rotation at C5-6, remove the screws, then removing the plate.  The arthrodesis appears solid at C5-6.  We then incised the intervertebral disc at C6-7. We then performed a partial intervertebral discectomy with a pituitary forceps and the Karlin curettes. I then inserted distraction screws into the vertebral bodies at C6 and C7. We then distracted the interspace. We then used the high-speed drill to decorticate the vertebral endplates at W1-0, to drill away the remainder of the intervertebral disc, to drill away some posterior spondylosis, and to thin out the posterior longitudinal ligament. I then incised ligament with the arachnoid knife.  Of note we did encounter a large central right hernia just as expected, we removed it with the pituitary forceps and the Kerrison punches.  We then removed the ligament with a Kerrison punches undercutting the vertebral endplates and decompressing the thecal sac. We then performed foraminotomies about the bilateral C7 nerve roots. This completed the decompression at this level.  We now turned our to attention to the interbody fusion. We used the trial spacers to determine the appropriate size for the interbody prosthesis. We then pre-filled prosthesis with a combination of local morcellized  autograft bone that we obtained during  decompression as well as Kinnex bone graft extender. We then inserted the prosthesis into the distracted interspace at C6-7. We then removed the distraction screws. There was a good snug fit of the prosthesis in the interspace.  Having completed the fusion we now turned attention to the anterior spinal instrumentation. We used the high-speed drill to drill away some anterior spondylosis at the disc spaces so that the plate lay down flat. We selected the appropriate length titanium anterior cervical plate. We laid it along the anterior aspect of the vertebral bodies from C6 and C7. We then drilled two 12 mm holes at C7, we used the old screw holes at C6. We then secured the plate to the vertebral bodies by placing two 12 mm self-tapping screws at C6 and C7. We then obtained intraoperative radiograph. The demonstrating good position of the instrumentation. We therefore secured the screws the plate the locking each cam. This completed the instrumentation.  We then obtained hemostasis using bipolar electrocautery. We irrigated the wound out with bacitracin solution. We then removed the retractor. We inspected the esophagus for any damage. There was none apparent. We then reapproximated patient's platysmal muscle with interrupted 3-0 Vicryl suture. We then reapproximated the subcutaneous tissue with interrupted 3-0 Vicryl suture. The skin was reapproximated with Steri-Strips and benzoin. The wound was then covered with bacitracin ointment. A sterile dressing was applied. The drapes were removed. Patient was subsequently extubated by the anesthesia team and transported to the post anesthesia care unit in stable condition. All sponge instrument and needle counts were reportedly correct at the end of this case.

## 2017-04-28 NOTE — Progress Notes (Signed)
Subjective: The patient is alert and pleasant.  She looks well.  She is in no apparent distress.  Objective: Vital signs in last 24 hours: Temp:  [97.5 F (36.4 C)-98.6 F (37 C)] 98.6 F (37 C) (03/18 1635) Pulse Rate:  [65-73] 67 (03/18 1635) Resp:  [12-20] 16 (03/18 1635) BP: (141-178)/(66-78) 144/67 (03/18 1635) SpO2:  [95 %-99 %] 95 % (03/18 1635) Weight:  [54.3 kg (119 lb 9.6 oz)] 54.3 kg (119 lb 9.6 oz) (03/18 1157) Estimated body mass index is 23.36 kg/m as calculated from the following:   Height as of this encounter: 5' (1.524 m).   Weight as of this encounter: 54.3 kg (119 lb 9.6 oz).   Intake/Output from previous day: No intake/output data recorded. Intake/Output this shift: Total I/O In: 1315 [P.O.:240; I.V.:1000; Other:75] Out: 30 [Blood:30]  Physical exam the patient is alert and oriented.  She is moving all 4 extremities well.  The patient's dressing is clean and dry.  There is no hematoma or shift.  Lab Results: No results for input(s): WBC, HGB, HCT, PLT in the last 72 hours. BMET No results for input(s): NA, K, CL, CO2, GLUCOSE, BUN, CREATININE, CALCIUM in the last 72 hours.  Studies/Results: Dg Cervical Spine 1 View  Result Date: 04/28/2017 CLINICAL DATA:  Neck pain. EXAM: DG CERVICAL SPINE - 1 VIEW COMPARISON:  MRI cervical 03/02/2017. FINDINGS: Previous plate at H2-1 has been removed. New plate at Y2-4 has been placed. Gross satisfactory position and alignment. Multiple sponges are present in the soft tissues of the anterior neck. Endotracheal tube is noted. IMPRESSION: As above. Electronically Signed   By: Staci Righter M.D.   On: 04/28/2017 15:39    Assessment/Plan: The patient is doing well.  I spoke with the family.  She would likely go home tomorrow.  LOS: 0 days     Ophelia Charter 04/28/2017, 5:00 PM

## 2017-04-28 NOTE — Transfer of Care (Signed)
Immediate Anesthesia Transfer of Care Note  Patient: Jennifer Green  Procedure(s) Performed: ANTERIOR CERVICAL DECOMPRESSION/DISCECTOMY FUSION, INTERBODY PROSTHESIS CERVICAL SIX- CERVICAL SEVEN, EXPLORE FUSION, REMOVAL OF OLD SKYLINE PLATE CERVICAL FIVE- CERVICAL SIX (N/A )  Patient Location: PACU  Anesthesia Type:General  Level of Consciousness: awake, alert  and oriented  Airway & Oxygen Therapy: Patient Spontanous Breathing and Patient connected to nasal cannula oxygen  Post-op Assessment: Report given to RN and Post -op Vital signs reviewed and stable  Post vital signs: Reviewed and stable  Last Vitals:  Vitals:   04/28/17 1157  BP: (!) 178/78  Pulse: 65  Resp: 20  Temp: (!) 36.4 C  SpO2: 99%    Last Pain:  Vitals:   04/28/17 1233  TempSrc:   PainSc: 3       Patients Stated Pain Goal: 3 (32/99/24 2683)  Complications: No apparent anesthesia complications

## 2017-04-29 MED ORDER — CYCLOBENZAPRINE HCL 10 MG PO TABS: 10.0000 mg | ORAL_TABLET | Freq: Three times a day (TID) | ORAL | 1 refills | Status: DC | PRN

## 2017-04-29 MED ORDER — DOCUSATE SODIUM 100 MG PO CAPS: 100.0000 mg | ORAL_CAPSULE | Freq: Two times a day (BID) | ORAL | 0 refills | Status: DC

## 2017-04-29 MED ORDER — OXYCODONE HCL 5 MG PO TABS: 5.0000 mg | ORAL_TABLET | ORAL | 0 refills | Status: DC | PRN

## 2017-04-29 MED FILL — Thrombin For Soln 5000 Unit: CUTANEOUS | Qty: 5000 | Status: AC

## 2017-04-29 NOTE — Progress Notes (Signed)
Pt doing well. Pt and husband given D/C instructions with verbal understanding. Rx's were sent to pharmacy by MD. Pt's incision is clean and dry with no sign of infection. Pt's IV was removed prior to D/C. Pt D/C'd home via wheelchair @ 1000 per MD order. Pt is stable at D/C and has no other needs at this time. Holli Humbles, RN

## 2017-04-29 NOTE — Discharge Instructions (Signed)

## 2017-04-29 NOTE — Evaluation (Signed)
Occupational Therapy Evaluation Patient Details Name: Jennifer Green MRN: [id number] DOB: 1940/06/21 Today's Date: 04/29/2017    History of Present Illness 77 yo female s/p C6-7 ACDF with brace   Clinical Impression   Patient evaluated by Occupational Therapy with no further acute OT needs identified. All education has been completed and the patient has no further questions. See below for any follow-up Occupational Therapy or equipment needs. OT to sign off. Thank you for referral.      Follow Up Recommendations  No OT follow up    Equipment Recommendations  None recommended by OT    Recommendations for Other Services       Precautions / Restrictions Precautions Precautions: Cervical Precaution Comments: handout provided and reviewed for detail Required Braces or Orthoses: Cervical Brace Cervical Brace: Hard collar(on with mobility per notes)      Mobility Bed Mobility               General bed mobility comments: discussed verbally and pt with good verbal recal of precautions.  Transfers Overall transfer level: Modified independent                    Balance                                           ADL either performed or assessed with clinical judgement   ADL Overall ADL's : Modified independent                                       General ADL Comments: pt up in bathroom on arrival. pt educated with demo by patient for don doff brace, education on bathing/ dressing, wear schedule for brace. pt completed walk in shower tranfer and educated on water temperature to decr fall risk. Pt positioned in chair to demonstrate proper positioning and pillow alignment. Pt educated on car transfer.   Cervical precautions ( handout provided):educated on oral care using cups, washing face with cloth, never to wash directly on incision site, avoid neck rotation flexion and extension, positioning with pillows in chair for bil UE,  sleeping positioning, avoiding pushing / pulling with bil UE, Pt with button up shirt and able to don shirt prior to OT without (A).  Pt educated on need to notify doctor / RN of swallowing changes or choking..    Vision Baseline Vision/History: No visual deficits       Perception     Praxis      Pertinent Vitals/Pain Pain Assessment: No/denies pain     Hand Dominance Right   Extremity/Trunk Assessment Upper Extremity Assessment Upper Extremity Assessment: Overall WFL for tasks assessed(reports has resolved)   Lower Extremity Assessment Lower Extremity Assessment: Overall WFL for tasks assessed   Cervical / Trunk Assessment Cervical / Trunk Assessment: Other exceptions Cervical / Trunk Exceptions: s/p surgery   Communication Communication Communication: HOH(Right ear)   Cognition Arousal/Alertness: Awake/alert Behavior During Therapy: WFL for tasks assessed/performed Overall Cognitive Status: Within Functional Limits for tasks assessed                                 General Comments: w   General Comments  incision dry and intact    Exercises  Shoulder Instructions      Home Living Family/patient expects to be discharged to:: Private residence Living Arrangements: Spouse/significant other   Type of Home: House Home Access: Level entry     Home Layout: One level     Bathroom Shower/Tub: Occupational psychologist: Standard     Home Equipment: None   Additional Comments: will have spouse and granddaughter (A) . Granddaughter is PT      Prior Functioning/Environment Level of Independence: Independent                 OT Problem List:        OT Treatment/Interventions:      OT Goals(Current goals can be found in the care plan section) Acute Rehab OT Goals Patient Stated Goal: to go home today Potential to Achieve Goals: Good  OT Frequency:     Barriers to D/C:            Co-evaluation               AM-PAC PT "6 Clicks" Daily Activity     Outcome Measure Help from another person eating meals?: None Help from another person taking care of personal grooming?: None Help from another person toileting, which includes using toliet, bedpan, or urinal?: None Help from another person bathing (including washing, rinsing, drying)?: None Help from another person to put on and taking off regular upper body clothing?: None Help from another person to put on and taking off regular lower body clothing?: None 6 Click Score: 24   End of Session Equipment Utilized During Treatment: Cervical collar Nurse Communication: Mobility status;Precautions  Activity Tolerance: Patient tolerated treatment well Patient left: in chair;with call bell/phone within reach  OT Visit Diagnosis: Unsteadiness on feet (R26.81)                Time: 0811-0822 OT Time Calculation (min): 11 min Charges:  OT General Charges $OT Visit: 1 Visit OT Evaluation $OT Eval Moderate Complexity: 1 Mod G-Codes:      Jeri Modena   OTR/L Pager: [phone] Office: [phone] .   Parke Poisson B 04/29/2017, 9:18 AM

## 2017-04-29 NOTE — Discharge Summary (Signed)
Physician Discharge Summary  Patient ID: Jennifer Green MRN: [id number] DOB/AGE: 79-Dec-1942 79 y.o.  Admit date: 04/28/2017 Discharge date: 04/29/2017  Admission Diagnoses: C6-7 herniated disc, cervicalgia, cervical radiculopathy  Discharge Diagnoses: The same Active Problems:   Cervical herniated disc   Discharged Condition: good  Hospital Course: I performed a C6-7 anterior cervical discectomy, fusion, plating, exploration of fusion on the patient on 04/28/2017.  The surgery went well.  The patient's postoperative course was unremarkable.  On postoperative day #1 the patient requested discharge home.  She was given written and oral discharge instructions.  All her questions were answered.  Consults: None Significant Diagnostic Studies: None Treatments: C6-7 anterior cervical discectomy, fusion, plating, exploration of fusion/removal of old cervical plate and screws Discharge Exam: Blood pressure 138/77, pulse 66, temperature 98.4 F (36.9 C), temperature source Oral, resp. rate 16, height 5' (1.524 m), weight 54.3 kg (119 lb 9.6 oz), SpO2 95 %. The patient is alert and pleasant.  She looks well.  Her dressing is clean and dry.  There is no hematoma or shift.  She is moving all 4 extremities well.  Disposition: Home  Discharge Instructions    Call MD for:  difficulty breathing, headache or visual disturbances   Complete by:  As directed    Call MD for:  extreme fatigue   Complete by:  As directed    Call MD for:  hives   Complete by:  As directed    Call MD for:  persistant dizziness or light-headedness   Complete by:  As directed    Call MD for:  persistant nausea and vomiting   Complete by:  As directed    Call MD for:  redness, tenderness, or signs of infection (pain, swelling, redness, odor or green/yellow discharge around incision site)   Complete by:  As directed    Call MD for:  severe uncontrolled pain   Complete by:  As directed    Call MD for:  temperature >100.4    Complete by:  As directed    Diet - low sodium heart healthy   Complete by:  As directed    Discharge instructions   Complete by:  As directed    Call [phone] for a followup appointment. Take a stool softener while you are using pain medications.   Driving Restrictions   Complete by:  As directed    Do not drive for 2 weeks.   Increase activity slowly   Complete by:  As directed    Lifting restrictions   Complete by:  As directed    Do not lift more than 5 pounds. No excessive bending or twisting.   May shower / Bathe   Complete by:  As directed    Remove the dressing for 3 days after surgery.  You may shower, but leave the incision alone.   Remove dressing in 48 hours   Complete by:  As directed    Your stitches are under the scan and will dissolve by themselves. The Steri-Strips will fall off after you take a few showers. Do not rub back or pick at the wound, Leave the wound alone.     Allergies as of 04/29/2017      Reactions   Indomethacin    Rapid Heart Beat   Promethazine Hcl Other (See Comments)   Couldn't stay still      Medication List    STOP taking these medications   ibuprofen 200 MG tablet Commonly known as:  ADVIL,MOTRIN  tiZANidine 2 MG tablet Commonly known as:  ZANAFLEX     TAKE these medications   allopurinol 300 MG tablet Commonly known as:  ZYLOPRIM TAKE ONE TABLET BY MOUTH ONCE DAILY   amLODipine 5 MG tablet Commonly known as:  NORVASC TAKE 1 TABLET BY MOUTH  DAILY What changed:    how much to take  how to take this  when to take this   apraclonidine 0.5 % ophthalmic solution Commonly known as:  IOPIDINE Place 1 drop into both eyes 2 (two) times daily.   atenolol 25 MG tablet Commonly known as:  TENORMIN TAKE ONE-HALF TABLET BY  MOUTH DAILY What changed:    how much to take  how to take this  when to take this  additional instructions   CALCIUM 600+D3 PO Take 1 tablet by mouth daily.   cyclobenzaprine 10 MG  tablet Commonly known as:  FLEXERIL Take 1 tablet (10 mg total) by mouth 3 (three) times daily as needed for muscle spasms.   docusate sodium 100 MG capsule Commonly known as:  COLACE Take 1 capsule (100 mg total) by mouth 2 (two) times daily.   dorzolamide-timolol 22.3-6.8 MG/ML ophthalmic solution Commonly known as:  COSOPT Place 1 drop into both eyes 2 (two) times daily.   latanoprost 0.005 % ophthalmic solution Commonly known as:  XALATAN Place 1 drop into both eyes at bedtime.   Magnesium 400 MG Caps Take 400 mg by mouth every other day. In the morning   oxyCODONE 5 MG immediate release tablet Commonly known as:  Oxy IR/ROXICODONE Take 1 tablet (5 mg total) by mouth every 4 (four) hours as needed for moderate pain ((score 4 to 6)).   pilocarpine 4 % ophthalmic solution Commonly known as:  PILOCAR Place 1 drop into both eyes 2 (two) times daily.   PRESERVISION AREDS Caps Take 1 capsule by mouth daily.   ranitidine 300 MG tablet Commonly known as:  ZANTAC TAKE ONE TABLET BY MOUTH AT BEDTIME   traZODone 50 MG tablet Commonly known as:  DESYREL Take 1-2 tablets (50-100 mg total) by mouth at bedtime as needed.   vitamin C 1000 MG tablet Take 1,000 mg by mouth daily.        Signed: Ophelia Charter 04/29/2017, 6:48 AM

## 2017-04-29 NOTE — Anesthesia Postprocedure Evaluation (Signed)
Anesthesia Post Note  Patient: Jennifer Green  Procedure(s) Performed: ANTERIOR CERVICAL DECOMPRESSION/DISCECTOMY FUSION, INTERBODY PROSTHESIS CERVICAL SIX- CERVICAL SEVEN, EXPLORE FUSION, REMOVAL OF OLD SKYLINE PLATE CERVICAL FIVE- CERVICAL SIX (N/A )     Patient location during evaluation: PACU Anesthesia Type: General Level of consciousness: awake and alert Pain management: pain level controlled Vital Signs Assessment: post-procedure vital signs reviewed and stable Respiratory status: spontaneous breathing, nonlabored ventilation, respiratory function stable and patient connected to nasal cannula oxygen Cardiovascular status: blood pressure returned to baseline and stable Postop Assessment: no apparent nausea or vomiting Anesthetic complications: no    Last Vitals:  Vitals:   04/29/17 0420 04/29/17 0700  BP: 138/77 107/78  Pulse: 66 70  Resp: 16 16  Temp: 36.9 C 36.9 C  SpO2: 95% 95%    Last Pain:  Vitals:   04/29/17 0604  TempSrc:   PainSc: 3                  Effie Berkshire

## 2017-04-30 ENCOUNTER — Encounter (HOSPITAL_COMMUNITY): Payer: Self-pay | Admitting: Neurosurgery

## 2017-04-30 LAB — TYPE AND SCREEN
ABO/RH(D): A POS
Antibody Screen: NEGATIVE
Unit division: 0
Unit division: 0

## 2017-04-30 LAB — BPAM RBC
BLOOD PRODUCT EXPIRATION DATE: 201903292359
Blood Product Expiration Date: 201903292359
Unit Type and Rh: 6200
Unit Type and Rh: 6200

## 2017-05-01 ENCOUNTER — Encounter (HOSPITAL_COMMUNITY): Payer: Self-pay | Admitting: Neurosurgery

## 2017-09-09 ENCOUNTER — Ambulatory Visit (INDEPENDENT_AMBULATORY_CARE_PROVIDER_SITE_OTHER): Payer: Medicare Other

## 2017-09-09 ENCOUNTER — Ambulatory Visit (INDEPENDENT_AMBULATORY_CARE_PROVIDER_SITE_OTHER): Payer: Medicare Other | Admitting: Family Medicine

## 2017-09-09 ENCOUNTER — Encounter: Payer: Self-pay | Admitting: Family Medicine

## 2017-09-09 VITALS — BP 130/80 | HR 74 | Temp 97.8°F | Wt 121.0 lb

## 2017-09-09 DIAGNOSIS — M25511 Pain in right shoulder: Secondary | ICD-10-CM | POA: Diagnosis not present

## 2017-09-09 MED ORDER — METHYLPREDNISOLONE ACETATE 40 MG/ML IJ SUSP
40.0000 mg | Freq: Once | INTRAMUSCULAR | Status: AC
  Administered 2017-09-09: 40 mg via INTRA_ARTICULAR
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
## 2017-09-09 NOTE — Patient Instructions (Signed)
Try some icing to shoulder 20 minutes 2 to 3 times daily  Gentle range of motion, as discussed  Let me know if shoulder not improving in one to two weeks.

## 2017-09-12 ENCOUNTER — Other Ambulatory Visit: Payer: Self-pay

## 2017-11-04 ENCOUNTER — Other Ambulatory Visit: Payer: Self-pay | Admitting: Family Medicine

## 2017-11-04 DIAGNOSIS — Z1231 Encounter for screening mammogram for malignant neoplasm of breast: Secondary | ICD-10-CM

## 2017-11-12 ENCOUNTER — Other Ambulatory Visit: Payer: Self-pay | Admitting: Family Medicine

## 2017-12-15 ENCOUNTER — Ambulatory Visit
Admission: RE | Admit: 2017-12-15 | Discharge: 2017-12-15 | Disposition: A | Discharge status: Home / Self Care | Payer: Medicare Other | Source: Ambulatory Visit | Attending: Family Medicine | Admitting: Family Medicine

## 2017-12-15 DIAGNOSIS — Z1231 Encounter for screening mammogram for malignant neoplasm of breast: Secondary | ICD-10-CM

## 2017-12-23 ENCOUNTER — Ambulatory Visit (INDEPENDENT_AMBULATORY_CARE_PROVIDER_SITE_OTHER): Payer: Medicare Other | Admitting: Family Medicine

## 2017-12-23 ENCOUNTER — Encounter: Payer: Self-pay | Admitting: Family Medicine

## 2017-12-23 VITALS — BP 140/82 | HR 58 | Temp 98.2°F | Ht 59.0 in | Wt 119.1 lb

## 2017-12-23 DIAGNOSIS — G4700 Insomnia, unspecified: Secondary | ICD-10-CM

## 2017-12-23 DIAGNOSIS — I10 Essential (primary) hypertension: Secondary | ICD-10-CM | POA: Diagnosis not present

## 2017-12-23 DIAGNOSIS — E876 Hypokalemia: Secondary | ICD-10-CM

## 2017-12-23 DIAGNOSIS — R609 Edema, unspecified: Secondary | ICD-10-CM

## 2017-12-23 DIAGNOSIS — R600 Localized edema: Secondary | ICD-10-CM

## 2017-12-23 DIAGNOSIS — K219 Gastro-esophageal reflux disease without esophagitis: Secondary | ICD-10-CM

## 2017-12-23 MED ORDER — ATENOLOL 25 MG PO TABS: ORAL_TABLET | ORAL | 3 refills | Status: DC

## 2017-12-23 MED ORDER — AMLODIPINE BESYLATE 5 MG PO TABS: 5.0000 mg | ORAL_TABLET | Freq: Every day | ORAL | 3 refills | Status: DC

## 2017-12-23 NOTE — Progress Notes (Signed)
   Subjective:    Patient ID: Jennifer Green, female    DOB: 06-Aug-1940, 77 y.o.   MRN: [id number]  HPI Here to follow up on issues. She feels well except for a mild pain in the left side that started a few weeks ago. This is slowly going away. Her BP is stable. Her gout is stable. She sleeps well. She stays busy taking care of her husband who has Alzheimers dementia.    Review of Systems  Constitutional: Negative.   HENT: Negative.   Eyes: Negative.   Respiratory: Negative.   Cardiovascular: Negative.   Gastrointestinal: Negative.   Genitourinary: Negative for decreased urine volume, difficulty urinating, dyspareunia, dysuria, enuresis, flank pain, frequency, hematuria, pelvic pain and urgency.  Musculoskeletal: Negative.   Skin: Negative.   Neurological: Negative.   Psychiatric/Behavioral: Negative.        Objective:   Physical Exam  Constitutional: She is oriented to person, place, and time. She appears well-developed and well-nourished. No distress.  HENT:  Head: Normocephalic and atraumatic.  Right Ear: External ear normal.  Left Ear: External ear normal.  Nose: Nose normal.  Mouth/Throat: Oropharynx is clear and moist. No oropharyngeal exudate.  Eyes: Pupils are equal, round, and reactive to light. Conjunctivae and EOM are normal. No scleral icterus.  Neck: Normal range of motion. Neck supple. No JVD present. No thyromegaly present.  Cardiovascular: Normal rate, regular rhythm, normal heart sounds and intact distal pulses. Exam reveals no gallop and no friction rub.  No murmur heard. Pulmonary/Chest: Effort normal and breath sounds normal. No respiratory distress. She has no wheezes. She has no rales. She exhibits no tenderness.  Abdominal: Soft. Bowel sounds are normal. She exhibits no distension and no mass. There is no tenderness. There is no rebound and no guarding.  Musculoskeletal: Normal range of motion. She exhibits no edema.  Mildly tender along the left lateral  trunk, full ROM of the spine   Lymphadenopathy:    She has no cervical adenopathy.  Neurological: She is alert and oriented to person, place, and time. She has normal reflexes. She displays normal reflexes. No cranial nerve deficit. She exhibits normal muscle tone. Coordination normal.  Skin: Skin is warm and dry. No rash noted. No erythema.  Psychiatric: She has a normal mood and affect. Her behavior is normal. Judgment and thought content normal.          Assessment & Plan:  Her HTN and gout are stable. Her GERD is stable. Her insomnia is stable. We will check fasting labs. She seems to have a mild strain of the left trunk muscle and this seems to be healing well.  Alysia Penna, MD

## 2018-01-01 ENCOUNTER — Other Ambulatory Visit (INDEPENDENT_AMBULATORY_CARE_PROVIDER_SITE_OTHER): Payer: Medicare Other

## 2018-01-01 DIAGNOSIS — I10 Essential (primary) hypertension: Secondary | ICD-10-CM

## 2018-01-01 LAB — LIPID PANEL
Cholesterol: 183 mg/dL (ref 0–200)
HDL: 40.1 mg/dL (ref >39.00)
LDL Cholesterol: 106 mg/dL — ABNORMAL HIGH (ref 0–99)
NONHDL: 142.53
Total CHOL/HDL Ratio: 5
Triglycerides: 185 mg/dL — ABNORMAL HIGH (ref 0.0–149.0)
VLDL: 37 mg/dL (ref 0.0–40.0)

## 2018-01-01 LAB — CBC WITH DIFFERENTIAL/PLATELET
Basophils Absolute: 0 10*3/uL (ref 0.0–0.1)
Basophils Relative: 0.9 % (ref 0.0–3.0)
EOS ABS: 0.1 10*3/uL (ref 0.0–0.7)
Eosinophils Relative: 2 % (ref 0.0–5.0)
HEMATOCRIT: 40.5 % (ref 36.0–46.0)
Hemoglobin: 13.9 g/dL (ref 12.0–15.0)
LYMPHS PCT: 39.1 % (ref 12.0–46.0)
Lymphs Abs: 1.8 10*3/uL (ref 0.7–4.0)
MCHC: 34.2 g/dL (ref 30.0–36.0)
MCV: 95 fl (ref 78.0–100.0)
Monocytes Absolute: 0.3 10*3/uL (ref 0.1–1.0)
Monocytes Relative: 7.1 % (ref 3.0–12.0)
NEUTROS ABS: 2.3 10*3/uL (ref 1.4–7.7)
Neutrophils Relative %: 50.9 % (ref 43.0–77.0)
PLATELETS: 245 10*3/uL (ref 150.0–400.0)
RBC: 4.26 Mil/uL (ref 3.87–5.11)
RDW: 13.8 % (ref 11.5–15.5)
WBC: 4.5 10*3/uL (ref 4.0–10.5)

## 2018-01-01 LAB — POC URINALSYSI DIPSTICK (AUTOMATED)
Bilirubin, UA: NEGATIVE
GLUCOSE UA: NEGATIVE
Ketones, UA: NEGATIVE
Leukocytes, UA: NEGATIVE
NITRITE UA: NEGATIVE
PH UA: 8.5 — AB (ref 5.0–8.0)
Protein, UA: NEGATIVE
RBC UA: NEGATIVE
Spec Grav, UA: 1.015 (ref 1.010–1.025)
UROBILINOGEN UA: 0.2 U/dL

## 2018-01-01 LAB — HEPATIC FUNCTION PANEL
ALBUMIN: 4.2 g/dL (ref 3.5–5.2)
ALK PHOS: 84 U/L (ref 39–117)
ALT: 11 U/L (ref 0–35)
AST: 16 U/L (ref 0–37)
BILIRUBIN DIRECT: 0.1 mg/dL (ref 0.0–0.3)
TOTAL PROTEIN: 6.7 g/dL (ref 6.0–8.3)
Total Bilirubin: 0.5 mg/dL (ref 0.2–1.2)

## 2018-01-01 LAB — BASIC METABOLIC PANEL
BUN: 18 mg/dL (ref 6–23)
CHLORIDE: 104 meq/L (ref 96–112)
CO2: 29 meq/L (ref 19–32)
CREATININE: 0.9 mg/dL (ref 0.40–1.20)
Calcium: 9.4 mg/dL (ref 8.4–10.5)
GFR: 64.08 mL/min (ref >60.00)
Glucose, Bld: 118 mg/dL — ABNORMAL HIGH (ref 70–99)
Potassium: 4.6 mEq/L (ref 3.5–5.1)
Sodium: 142 mEq/L (ref 135–145)

## 2018-01-01 LAB — TSH: TSH: 5.83 u[IU]/mL — ABNORMAL HIGH (ref 0.35–4.50)

## 2018-01-02 ENCOUNTER — Other Ambulatory Visit: Payer: Self-pay | Admitting: Family Medicine

## 2018-01-02 MED ORDER — LEVOTHYROXINE SODIUM 75 MCG PO TABS: 75.0000 ug | ORAL_TABLET | Freq: Every day | ORAL | 3 refills | Status: DC

## 2018-01-02 NOTE — Telephone Encounter (Signed)
The medication has been sent to the pharmacy per the pts request.  She is aware that she will need to call in 90 days to get her lab appt set up to recheck her levels.

## 2018-02-20 ENCOUNTER — Telehealth: Payer: Self-pay | Admitting: Family Medicine

## 2018-02-20 MED ORDER — OMEPRAZOLE 40 MG PO CPDR: 40.0000 mg | DELAYED_RELEASE_CAPSULE | Freq: Every day | ORAL | 5 refills | Status: DC

## 2018-02-20 NOTE — Telephone Encounter (Signed)
Copied from [place] [id number]. Topic: Quick Communication - See Telephone Encounter >> Feb 20, 2018 11:01 AM Blase Mess A wrote: CRM for notification. See Telephone encounter for: 02/20/18.  Patient is calling because the  ranitidine (ZANTAC) 300 MG tablet [[id number]] has been discontinued. The pharmacy is requesting a subsitute medication. Please advise [facility] [address] [place] - [address] [phone] (Phone) [phone] (Fax)

## 2018-02-20 NOTE — Telephone Encounter (Signed)
Dr. Fry please advise. Thanks  

## 2018-02-20 NOTE — Telephone Encounter (Signed)
Called and spoke with pt and she is aware of change in meds.  Omeprazole has been sent to her pharmacy and pt is aware.

## 2018-02-20 NOTE — Telephone Encounter (Signed)
Call in Omeprazole 40 mg to take daily, #30 with 5 rf

## 2018-03-24 ENCOUNTER — Ambulatory Visit: Payer: Medicare Other | Admitting: Cardiology

## 2018-03-31 ENCOUNTER — Telehealth: Payer: Self-pay | Admitting: *Deleted

## 2018-03-31 DIAGNOSIS — I499 Cardiac arrhythmia, unspecified: Secondary | ICD-10-CM

## 2018-03-31 NOTE — Telephone Encounter (Signed)
Copied from [place] [id number]. Topic: Appointment Scheduling - Scheduling Inquiry for Clinic >> Mar 31, 2018 10:41 AM Margot Ables wrote: Reason for CRM: I scheduled lab appt for pt on 04/03/2018. Per lab notes 01/01/2018 pt to repeat labs in 90 days. Please enter orders as needed.

## 2018-04-02 ENCOUNTER — Other Ambulatory Visit (INDEPENDENT_AMBULATORY_CARE_PROVIDER_SITE_OTHER): Payer: Medicare Other

## 2018-04-02 DIAGNOSIS — I499 Cardiac arrhythmia, unspecified: Secondary | ICD-10-CM | POA: Diagnosis not present

## 2018-04-02 LAB — TSH: TSH: 0.32 u[IU]/mL — ABNORMAL LOW (ref 0.35–4.50)

## 2018-04-03 ENCOUNTER — Other Ambulatory Visit: Payer: Medicare Other

## 2018-04-09 ENCOUNTER — Other Ambulatory Visit: Payer: Self-pay | Admitting: Family Medicine

## 2018-04-09 MED ORDER — LEVOTHYROXINE SODIUM 50 MCG PO TABS: 50.0000 ug | ORAL_TABLET | Freq: Every day | ORAL | 3 refills | Status: DC

## 2018-05-14 ENCOUNTER — Ambulatory Visit: Payer: Medicare Other | Admitting: Cardiology

## 2018-05-14 ENCOUNTER — Other Ambulatory Visit: Payer: Self-pay | Admitting: Family Medicine

## 2018-05-14 DIAGNOSIS — M109 Gout, unspecified: Secondary | ICD-10-CM

## 2018-05-14 MED ORDER — TRAZODONE HCL 50 MG PO TABS: 50.0000 mg | ORAL_TABLET | Freq: Every evening | ORAL | 0 refills | Status: DC | PRN

## 2018-05-14 MED ORDER — ALLOPURINOL 300 MG PO TABS: 300.0000 mg | ORAL_TABLET | Freq: Every day | ORAL | 0 refills | Status: DC

## 2018-06-25 ENCOUNTER — Telehealth: Payer: Self-pay | Admitting: Family Medicine

## 2018-06-25 MED ORDER — TRAZODONE HCL 50 MG PO TABS: 50.0000 mg | ORAL_TABLET | Freq: Every evening | ORAL | 1 refills | Status: DC | PRN

## 2018-06-25 NOTE — Telephone Encounter (Signed)
Copied from [place]. Topic: Quick Communication - Rx Refill/Question >> Jun 25, 2018  9:47 AM Rayann Heman wrote: Medication: traZODone (DESYREL) 50 MG tablet [[id number]]  Has the patient contacted their pharmacy?  Preferred Pharmacy (with phone number or street name):[facility] [place], [place]. [phone] (Phone) [phone] (Fax)   Agent: Please be advised that RX refills may take up to 3 business days. We ask that you follow-up with your pharmacy.

## 2018-06-25 NOTE — Telephone Encounter (Signed)
We can refill this but call her and find out how many she takes every night

## 2018-06-25 NOTE — Telephone Encounter (Signed)
Dr. Fry please advise on refill. Thanks  

## 2018-06-25 NOTE — Telephone Encounter (Signed)
Called the pt and she is aware of [facility].  She stated that she changed pharmacies and this needed to be called in

## 2018-08-13 ENCOUNTER — Telehealth: Payer: Self-pay | Admitting: Family Medicine

## 2018-08-13 DIAGNOSIS — M109 Gout, unspecified: Secondary | ICD-10-CM

## 2018-08-13 DIAGNOSIS — I10 Essential (primary) hypertension: Secondary | ICD-10-CM

## 2018-08-13 NOTE — Telephone Encounter (Signed)
Medication Refill - Medication: allopurinol (ZYLOPRIM) 300 MG tablet [[id number]]  atenolol (TENORMIN) 25 MG tablet [[id number]]   Has the patient contacted their pharmacy? Yes.   (Agent: If no, request that the patient contact the pharmacy for the refill.) (Agent: If yes, when and what did the pharmacy advise?)  Preferred Pharmacy (with phone number or street name):  [facility] [address] [place], [place]. [phone] (Phone)     Agent: Please be advised that RX refills may take up to 3 business days. We ask that you follow-up with your pharmacy.

## 2018-08-17 MED ORDER — ALLOPURINOL 300 MG PO TABS: 300.0000 mg | ORAL_TABLET | Freq: Every day | ORAL | 0 refills | Status: DC

## 2018-08-17 MED ORDER — ATENOLOL 25 MG PO TABS: ORAL_TABLET | ORAL | 1 refills | Status: DC

## 2018-09-17 ENCOUNTER — Ambulatory Visit: Payer: Self-pay | Admitting: Family Medicine

## 2018-09-17 NOTE — Telephone Encounter (Signed)
Pt reports BP has been running high for past 4 days. States usually 178/97-98, lowest 162/90. This am 179/98.  States she has "Doubled up on the atenolol." Has taken whole tab( 50mg ) past 4 days including this AM. HR range 59-65. Taking Norvasc as ordered.  Also reports mild intermittent lightheadedness and headache "During afternoons." Attempted to reach practice, recording. Pt states she would like virtual appt to discuss. Pts email and ph# verified. Care advise given per protocol, pt verbalizes understanding.   Please advise: [phone]  Reason for Disposition . Systolic BP  >= 676 OR Diastolic >= 720  Answer Assessment - Initial Assessment Questions 1. BLOOD PRESSURE: "What is the blood pressure?" "Did you take at least two measurements 5 minutes apart?"    179/98 2. ONSET: "When did you take your blood pressure?"     This am 3. HOW: "How did you obtain the blood pressure?" (e.g., visiting nurse, automatic home BP monitor)     Home monitor 4. HISTORY: "Do you have a history of high blood pressure?"     yes 5. MEDICATIONS: "Are you taking any medications for blood pressure?" "Have you missed any doses recently?"     Yes, no missed doses, "Doubled up on atenolol. 6. OTHER SYMPTOMS: "Do you have any symptoms?" (e.g., headache, chest pain, blurred vision, difficulty breathing, weakness)     Mild lightheadedness at times, headaches in afternoon  Protocols used: HIGH BLOOD PRESSURE-A-AH

## 2018-09-17 NOTE — Telephone Encounter (Signed)
Patient is aware of medication changes. Appointment has been scheduled for next week.

## 2018-09-17 NOTE — Telephone Encounter (Signed)
Please advise. Would you prefer to see this patient in office?

## 2018-09-17 NOTE — Telephone Encounter (Signed)
Tell her to keep taking a whole tab of Atenolol (total of 25 mg) daily and to increase the Amlodipine to 2 tabs (total of 10 mg) daily. Then see me next week

## 2018-09-17 NOTE — Telephone Encounter (Signed)
See note

## 2018-09-21 ENCOUNTER — Telehealth: Payer: Self-pay | Admitting: Family Medicine

## 2018-09-21 MED ORDER — OMEPRAZOLE 40 MG PO CPDR: 40.0000 mg | DELAYED_RELEASE_CAPSULE | Freq: Every day | ORAL | 5 refills | Status: DC

## 2018-09-21 NOTE — Telephone Encounter (Signed)
See request °

## 2018-09-21 NOTE — Telephone Encounter (Signed)
Rx has been sent in. Patient is aware. 

## 2018-09-21 NOTE — Telephone Encounter (Signed)
Medication Refill - Medication: omeprazole (PRILOSEC) 40 MG capsule (Pharamcy called and is requesting medication be signed off on)   Has the patient contacted their pharmacy? Yes (Agent: If no, request that the patient contact the pharmacy for the refill.) (Agent: If yes, when and what did the pharmacy advise?)Contact PCP  Preferred Pharmacy (with phone number or street name):  [facility] [address] [place], [place]. [phone] (Phone) [phone] (Fax)     Agent: Please be advised that RX refills may take up to 3 business days. We ask that you follow-up with your pharmacy.

## 2018-09-24 ENCOUNTER — Other Ambulatory Visit: Payer: Self-pay

## 2018-09-24 ENCOUNTER — Encounter: Payer: Self-pay | Admitting: Family Medicine

## 2018-09-24 ENCOUNTER — Telehealth (INDEPENDENT_AMBULATORY_CARE_PROVIDER_SITE_OTHER): Payer: Medicare Other | Admitting: Family Medicine

## 2018-09-24 DIAGNOSIS — I10 Essential (primary) hypertension: Secondary | ICD-10-CM | POA: Diagnosis not present

## 2018-09-24 MED ORDER — HYDROCHLOROTHIAZIDE 25 MG PO TABS: 25.0000 mg | ORAL_TABLET | Freq: Every day | ORAL | 3 refills | Status: DC

## 2018-09-24 MED ORDER — AMLODIPINE BESYLATE 10 MG PO TABS: 10.0000 mg | ORAL_TABLET | Freq: Every day | ORAL | 3 refills | Status: DC

## 2018-09-24 MED ORDER — ATENOLOL 25 MG PO TABS: 25.0000 mg | ORAL_TABLET | Freq: Every day | ORAL | 3 refills | Status: DC

## 2018-09-24 NOTE — Progress Notes (Signed)
Virtual Visit via Telephone Note  I connected with the patient on 09/24/18 at  9:30 AM EDT by telephone and verified that I am speaking with the correct person using two identifiers. We attempted to connect virtually but we had technical difficulties with the audio and video.     I discussed the limitations, risks, security and privacy concerns of performing an evaluation and management service by telephone and the availability of in person appointments. I also discussed with the patient that there may be a patient responsible charge related to this service. The patient expressed understanding and agreed to proceed.  Location patient: home Location provider: work or home office Participants present for the call: patient, provider Patient did not have a visit in the prior 7 days to address this/these issue(s).   History of Present Illness: She has been working on her BP for the past 4 weeks. As it went up she was feeling lightheaded and she was having headaches. No chest pain or SOB. When we talked to her a week ago we suggested she double up on Amlodipine to take a total of 10 mg daily, and she has doubled up on Atenolol to take a total of 25 mg daily. Now she feels better, and the BP has come down some. She is now averaging in the 170s over 90s.    Observations/Objective: Patient sounds cheerful and well on the phone. I do not appreciate any SOB. Speech and thought processing are grossly intact. Patient reported vitals:  Assessment and Plan: HTN. We will keep the Amlodipine and the Atenol where they are now, and we will add HCTZ 25 mg daily. Recheck in 2 weeks.  Alysia Penna, MD   Follow Up Instructions:     [id number] [phone] 11-20 [id number] 21-30 I did not refer this patient for an OV in the next 24 hours for this/these issue(s).  I discussed the assessment and treatment plan with the patient. The patient was provided an opportunity to ask questions and all were answered. The patient  agreed with the plan and demonstrated an understanding of the instructions.   The patient was advised to call back or seek an in-person evaluation if the symptoms worsen or if the condition fails to improve as anticipated.  I provided 13 minutes of non-face-to-face time during this encounter.   Alysia Penna, MD

## 2018-11-05 IMAGING — MR MR CERVICAL SPINE WO/W CM
8 of 10 series · 31 of 48 positions shown · IV contrast (multihance)
Comparison: Cervical spine radiographs 01/28/2017, MRI 09/14/2013

CLINICAL DATA: Right neck pain.  Cervical fusion

EXAM:
MRI CERVICAL SPINE WITHOUT AND WITH CONTRAST
TECHNIQUE: Multiplanar and multiecho pulse sequences of the cervical spine, to
include the craniocervical junction and cervicothoracic junction,
were obtained without and with intravenous contrast.
CONTRAST:  10mL MULTIHANCE GADOBENATE DIMEGLUMINE 529 MG/ML IV SOLN

[Series 4: T1 · sagittal · 3.0mm · 0.41mm/px · 4 of 15 slices shown (1 of 2)]
[im 1/15]
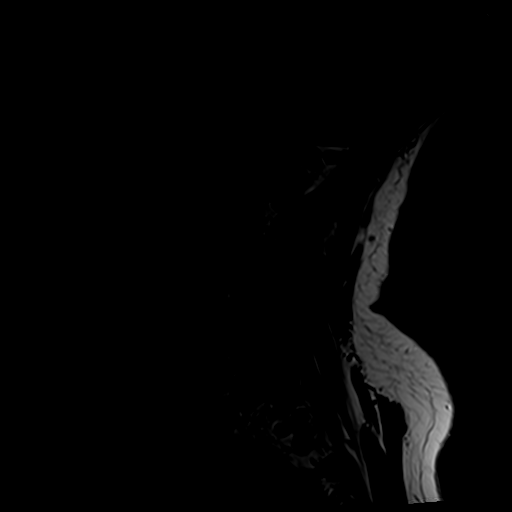
[im 5/15]
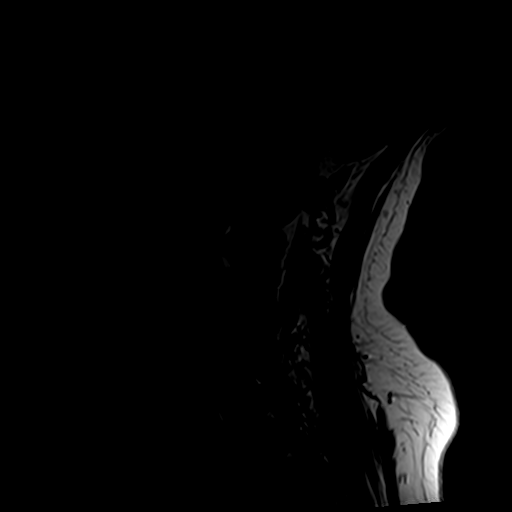
[im 10/15]
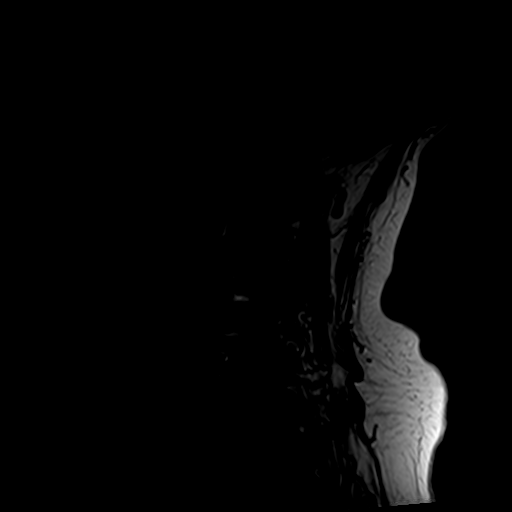
[im 15/15]
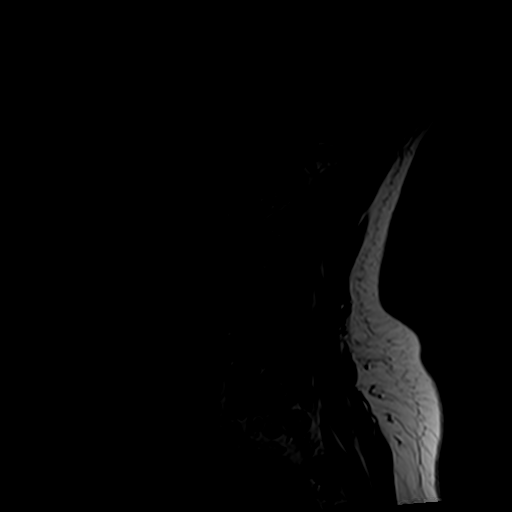

[Series 5: T2 · sagittal · 3.0mm · 0.66mm/px · 3 of 15 slices shown (1 of 3)]
[im 1/15]
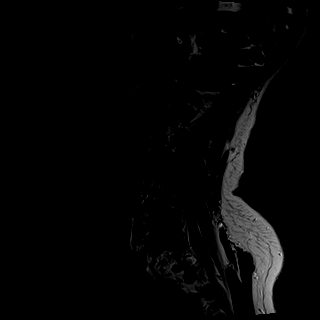
[im 8/15]
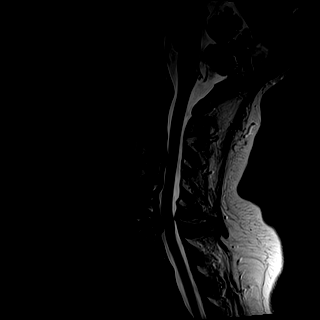
[im 15/15]
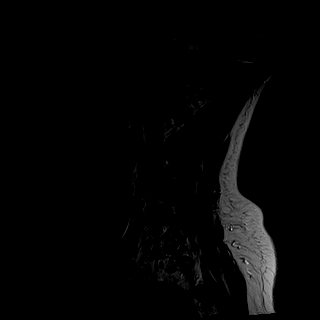

[Series 7: T2 · axial · 3.0mm · 0.70mm/px · z∈[-66,+42]mm · 7 of 30 slices shown (2 of 3)]
[im 1/30]
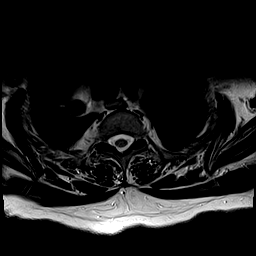
[im 5/30]
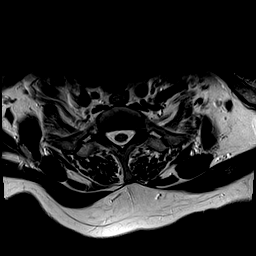
[im 10/30]
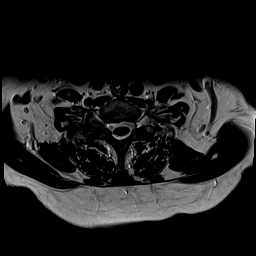
[im 15/30]
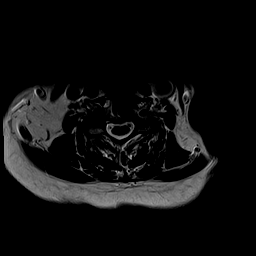
[im 20/30]
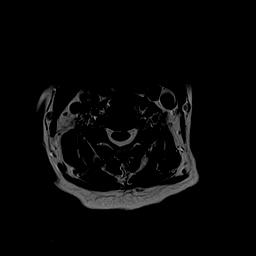
[im 25/30]
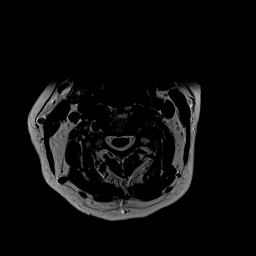
[im 30/30]
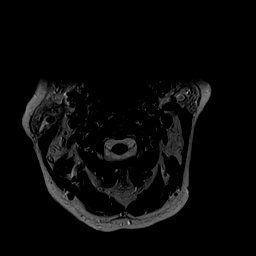

[Series 8: T1 · axial · 3.0mm · 0.35mm/px · z∈[-66,+42]mm · 7 of 30 slices shown (2 of 2)]
[im 1/30]
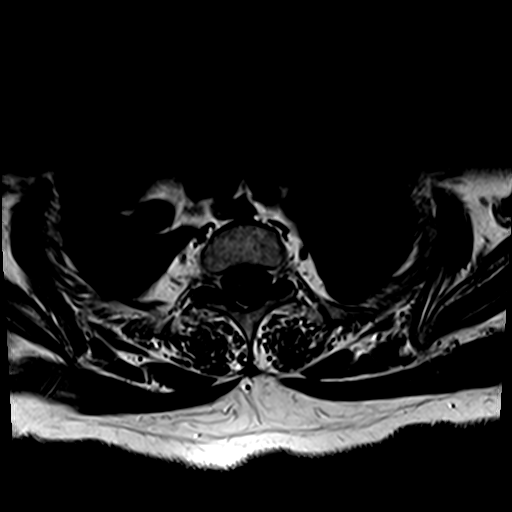
[im 5/30]
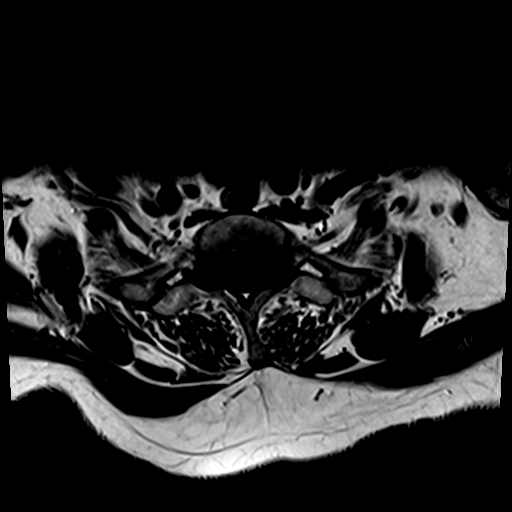
[im 10/30]
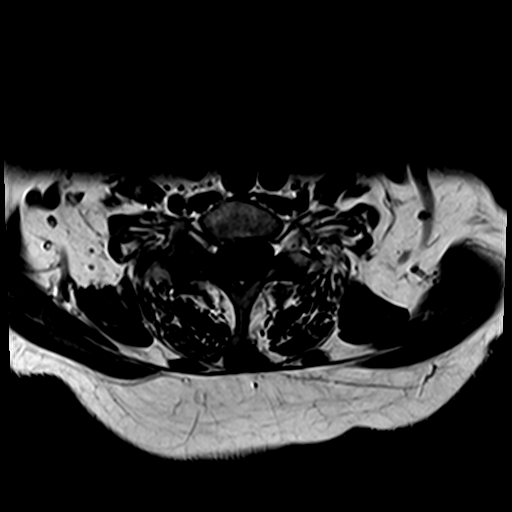
[im 15/30]
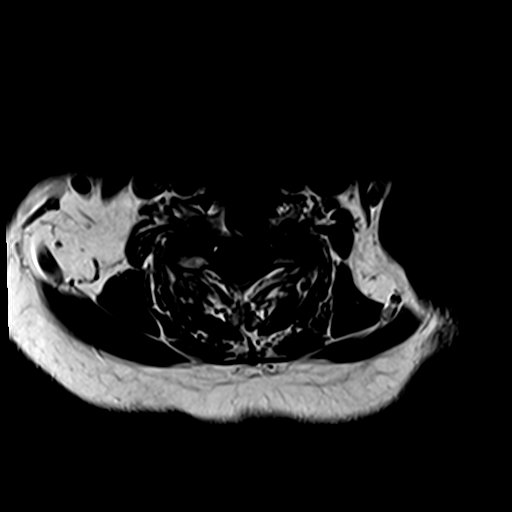
[im 20/30]
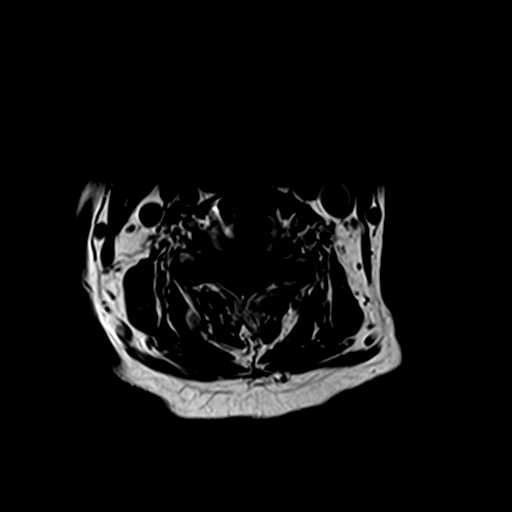
[im 25/30]
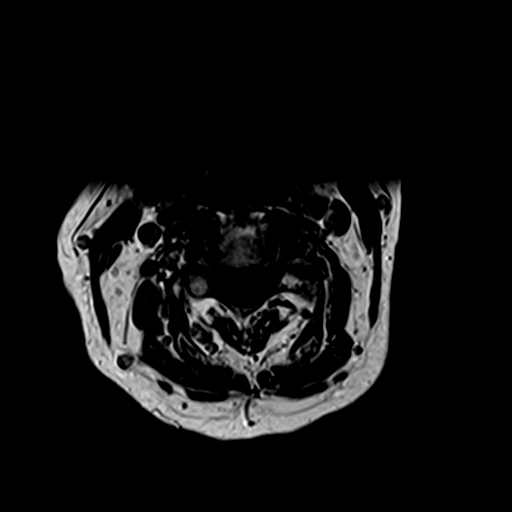
[im 30/30]
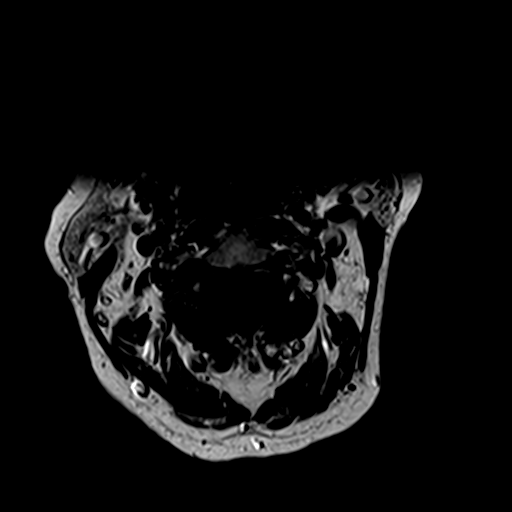

[Series 9: T2 · sagittal · 3.0mm · 0.66mm/px · 3 of 15 slices shown (3 of 3)]
[im 1/15]
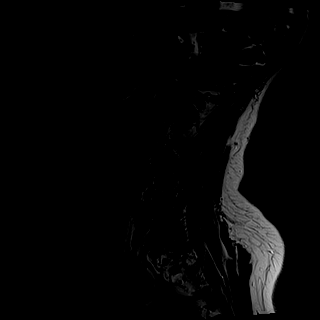
[im 8/15]
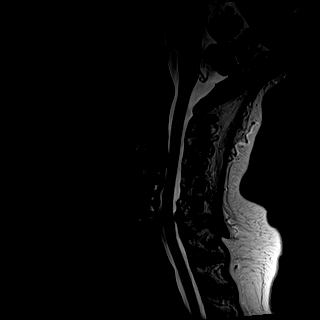
[im 15/15]
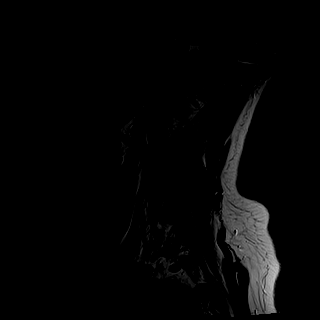

[Series 10: T1 fat-sat post-contrast · sagittal · 3.0mm · 0.82mm/px · 3 of 15 slices shown (1 of 2)]
[im 1/15]
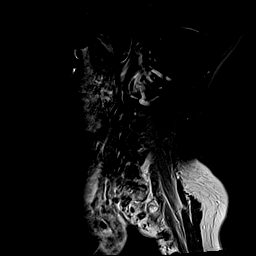
[im 8/15]
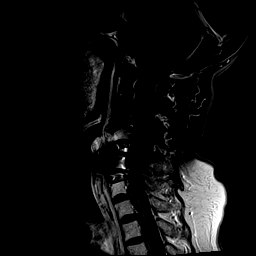
[im 15/15]
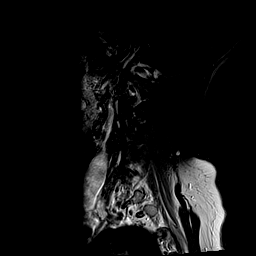

[Series 11: T1 post-contrast · axial · 3.0mm · 0.35mm/px · 1 of 30 slices shown]
[im 1/30]
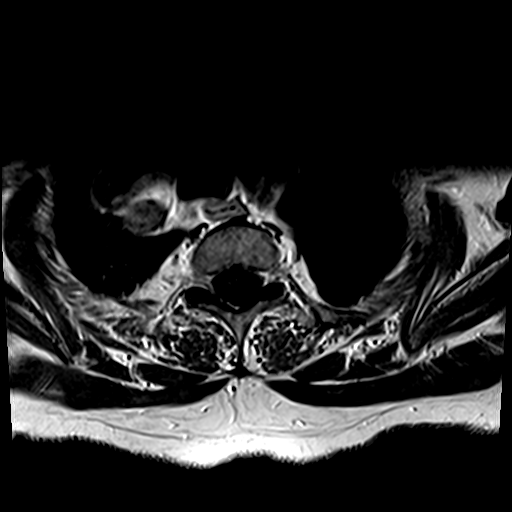

[Series 12: T1 fat-sat post-contrast · sagittal · 3.0mm · 0.82mm/px · 3 of 15 slices shown (2 of 2)]
[im 1/15]
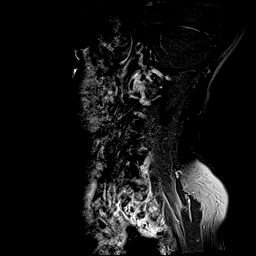
[im 8/15]
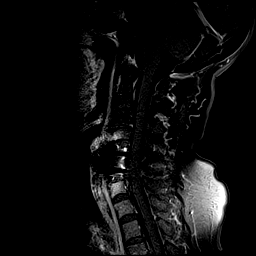
[im 15/15]
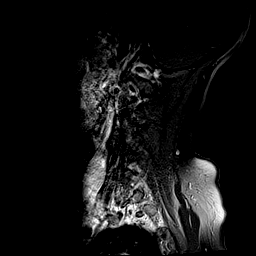

[31 of 48 positions shown; findings below may reference images not displayed]

FINDINGS: Alignment: Mild retrolisthesis C3-4 and C4-5.

Vertebrae: ACDF with anterior plate and screws at C5-6. Negative for
fracture or mass

Cord: Negative for cord lesion.  Normal cord signal

Posterior Fossa, vertebral arteries, paraspinal tissues: Negative

Disc levels:

C2-3: Small central disc protrusion without stenosis

C3-4: Moderate disc degeneration with diffuse uncinate spurring
causing mild spinal stenosis and moderate left foraminal stenosis.
Mild right foraminal narrowing. No change from the prior study.

C4-5: Mild disc degeneration without spinal or foraminal stenosis.

C5-6: ACDF without stenosis.

C6-7: Moderately large right-sided disc protrusion with cord
flattening and spinal stenosis. This is new since the prior MRI.
Moderate right foraminal narrowing.

C7-T1: Negative
IMPRESSION: Moderate left foraminal stenosis C3-4 due to spurring. Mild spinal
stenosis. No change from the prior study.

ACDF C5-6 without stenosis

Interval development of moderately large right paracentral disc
protrusion C6-7 with cord flattening and stenosis of the canal on
the right. Moderate right foraminal stenosis.

## 2018-11-11 ENCOUNTER — Other Ambulatory Visit: Payer: Self-pay | Admitting: Family Medicine

## 2018-11-11 DIAGNOSIS — Z1231 Encounter for screening mammogram for malignant neoplasm of breast: Secondary | ICD-10-CM

## 2018-11-24 ENCOUNTER — Other Ambulatory Visit: Payer: Self-pay | Admitting: Family Medicine

## 2018-11-24 DIAGNOSIS — M109 Gout, unspecified: Secondary | ICD-10-CM

## 2018-11-26 NOTE — Progress Notes (Signed)
[name] Date of Birth: October 12, 1940 Medical Record [id number]  History of Present Illness: Mrs. Guelker is seen today for followup of PVCs. Extensive evaluation in October 2012 including echocardiogram and stress Myoview study were normal. She did have coronary calcification noted on chest CT in 2/13. No history of angina.  She states she is doing very well from a cardiac standpoint. She has rare palpitations. She notes that her BP was high a month ago and her amlodipine and atenolol doses were increased. Since then she has noted some swelling in her hands. She cares for her husband who has Alzheimers. She is planning to have glaucoma surgery in January.   Current Outpatient Medications on File Prior to Visit  Medication Sig Dispense Refill  . allopurinol (ZYLOPRIM) 300 MG tablet TAKE ONE TABLET BY MOUTH ONE TIME DAILY 90 tablet 0  . amLODipine (NORVASC) 10 MG tablet Take 1 tablet (10 mg total) by mouth daily. 90 tablet 3  . apraclonidine (IOPIDINE) 0.5 % ophthalmic solution Place 1 drop into both eyes 2 (two) times daily.     . Ascorbic Acid (VITAMIN C) 1000 MG tablet Take 1,000 mg by mouth daily.    [name] atenolol (TENORMIN) 25 MG tablet Take 1 tablet (25 mg total) by mouth daily. 90 tablet 3  . Calcium Carb-Cholecalciferol (CALCIUM 600+D3 PO) Take 1 tablet by mouth daily.    [name] docusate sodium (COLACE) 100 MG capsule Take 1 capsule (100 mg total) by mouth 2 (two) times daily. 60 capsule 0  . dorzolamide-timolol (COSOPT) 22.3-6.8 MG/ML ophthalmic solution Place 1 drop into both eyes 2 (two) times daily.    . hydrochlorothiazide (HYDRODIURIL) 25 MG tablet Take 1 tablet (25 mg total) by mouth daily. 90 tablet 3  . levothyroxine (SYNTHROID, LEVOTHROID) 50 MCG tablet Take 1 tablet (50 mcg total) by mouth daily. 90 tablet 3  . Magnesium 400 MG CAPS Take 400 mg by mouth every other day. In the morning    . Multiple Vitamins-Minerals (PRESERVISION AREDS) CAPS Take 1 capsule by mouth daily.    [name]  omeprazole (PRILOSEC) 40 MG capsule Take 1 capsule (40 mg total) by mouth daily. 30 capsule 5  . pilocarpine (PILOCAR) 4 % ophthalmic solution Place 1 drop into both eyes 2 (two) times daily.    . traZODone (DESYREL) 50 MG tablet Take 1-2 tablets (50-100 mg total) by mouth at bedtime as needed. 180 tablet 1   No current facility-administered medications on file prior to visit.     Allergies  Allergen Reactions  . Indomethacin     Rapid Heart Beat  . Promethazine Hcl Other (See Comments)    Couldn't stay still    Past Medical History:  Diagnosis Date  . Allergy   . Arthritis   . Dyspnea   . Elevated blood sugar    790.21  . GERD (gastroesophageal reflux disease)   . Glaucoma   . Gout   . Hypertension   . Hypokalemia   . Hypomagnesemia   . Insomnia   . Murmur   . OP (osteoporosis)   . PVC (premature ventricular contraction)     Past Surgical History:  Procedure Laterality Date  . ANTERIOR CERVICAL DECOMP/DISCECTOMY FUSION N/A 04/28/2017   Procedure: ANTERIOR CERVICAL DECOMPRESSION/DISCECTOMY FUSION, INTERBODY PROSTHESIS CERVICAL SIX- CERVICAL SEVEN, EXPLORE FUSION, REMOVAL OF OLD SKYLINE PLATE CERVICAL FIVE- CERVICAL SIX;  Surgeon: Newman Pies, MD;  Location: [place];  Service: Neurosurgery;  Laterality: N/A;  . BACK SURGERY    .  CERVICAL FUSION  05/2010   per Dr. Newman Pies   . EYE SURGERY     right and left    Social History   Tobacco Use  Smoking Status Never Smoker  Smokeless Tobacco Never Used    Social History   Substance and Sexual Activity  Alcohol Use No    Family History  Problem Relation Age of Onset  . Pneumonia Mother   . Hypertension Mother   . Breast cancer Mother   . Heart disease Father     Review of Systems: As noted in history of present illness..  All other systems were reviewed and are negative.  Physical Exam: BP 116/62 (BP Location: Left Arm, Patient Position: Sitting, Cuff Size: Normal)   Pulse (!) 57   Temp (!) 97.2  F (36.2 C)   Ht 5' (1.524 m)   Wt 119 lb (54 kg)   BMI 23.24 kg/m   GENERAL:  Well appearing WF in NAD HEENT:  PERRL, EOMI, sclera are clear. Oropharynx is clear. NECK:  No jugular venous distention, carotid upstroke brisk and symmetric, no bruits, no thyromegaly or adenopathy LUNGS:  Clear to auscultation bilaterally CHEST:  Unremarkable HEART:  RRR,  PMI not displaced or sustained,S1 and S2 within normal limits, no S3, no S4: no clicks, no rubs, no murmurs ABD:  Soft, nontender. BS +, no masses or bruits. No hepatomegaly, no splenomegaly EXT:  2 + pulses throughout, no edema, no cyanosis no clubbing SKIN:  Warm and dry.  No rashes NEURO:  Alert and oriented x 3. Cranial nerves II through XII intact. PSYCH:  Cognitively intact    LABORATORY DATA:   Lab Results  Component Value Date   WBC 4.5 01/01/2018   HGB 13.9 01/01/2018   HCT 40.5 01/01/2018   PLT 245.0 01/01/2018   GLUCOSE 118 (H) 01/01/2018   CHOL 183 01/01/2018   TRIG 185.0 (H) 01/01/2018   HDL 40.10 01/01/2018   LDLDIRECT 142.1 04/25/2006   LDLCALC 106 (H) 01/01/2018   ALT 11 01/01/2018   AST 16 01/01/2018   NA 142 01/01/2018   K 4.6 01/01/2018   CL 104 01/01/2018   CREATININE 0.90 01/01/2018   BUN 18 01/01/2018   CO2 29 01/01/2018   TSH 0.32 (L) 04/02/2018   HGBA1C 6.3 08/28/2016   Ecg today shows NSR rate 57. normal. I have personally reviewed and interpreted this study.   Assessment / Plan: 1. 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 Continue beta blocker therapy.   2. Coronary calcification with normal myoview study 2012. No symptoms of angina. Recommend risk factor modification.   3. HTN now well controlled with recent medication changes.  Follow up in one year.

## 2018-12-03 ENCOUNTER — Ambulatory Visit (INDEPENDENT_AMBULATORY_CARE_PROVIDER_SITE_OTHER): Payer: Medicare Other | Admitting: Cardiology

## 2018-12-03 ENCOUNTER — Other Ambulatory Visit: Payer: Self-pay

## 2018-12-03 ENCOUNTER — Encounter: Payer: Self-pay | Admitting: Cardiology

## 2018-12-03 VITALS — BP 116/62 | HR 57 | Temp 97.2°F | Ht 60.0 in | Wt 119.0 lb

## 2018-12-03 DIAGNOSIS — I10 Essential (primary) hypertension: Secondary | ICD-10-CM | POA: Diagnosis not present

## 2018-12-03 DIAGNOSIS — I493 Ventricular premature depolarization: Secondary | ICD-10-CM

## 2018-12-03 DIAGNOSIS — I2510 Atherosclerotic heart disease of native coronary artery without angina pectoris: Secondary | ICD-10-CM | POA: Diagnosis not present

## 2018-12-18 ENCOUNTER — Telehealth: Payer: Self-pay | Admitting: Family Medicine

## 2018-12-18 MED ORDER — TRAZODONE HCL 50 MG PO TABS: 50.0000 mg | ORAL_TABLET | Freq: Every evening | ORAL | 1 refills | Status: DC | PRN

## 2018-12-18 NOTE — Telephone Encounter (Signed)
Medication: traZODone (DESYREL) 50 MG tablet [id number]   Has the patient contacted their pharmacy? Yes  (Agent: If no, request that the patient contact the pharmacy for the refill.) (Agent: If yes, when and what did the pharmacy advise?)  Preferred Pharmacy (with phone number or street name): [facility] [address] [place], [place]. [phone] (Phone) [phone] (Fax)    Agent: Please be advised that RX refills may take up to 3 business days. We ask that you follow-up with your pharmacy.

## 2018-12-23 NOTE — Telephone Encounter (Signed)
This has been taking care of. Phoned in to pharmacy.

## 2018-12-23 NOTE — Telephone Encounter (Signed)
Pharmacy called stating they did not receive the medication. They are requesting a call back. Please advise.

## 2018-12-24 ENCOUNTER — Other Ambulatory Visit: Payer: Self-pay

## 2018-12-24 ENCOUNTER — Ambulatory Visit
Admission: RE | Admit: 2018-12-24 | Discharge: 2018-12-24 | Disposition: A | Discharge status: Home / Self Care | Payer: Medicare Other | Source: Ambulatory Visit | Attending: Family Medicine | Admitting: Family Medicine

## 2018-12-24 DIAGNOSIS — Z1231 Encounter for screening mammogram for malignant neoplasm of breast: Secondary | ICD-10-CM

## 2019-01-06 ENCOUNTER — Other Ambulatory Visit: Payer: Self-pay

## 2019-03-09 ENCOUNTER — Ambulatory Visit: Payer: Medicare Other

## 2019-03-14 ENCOUNTER — Other Ambulatory Visit: Payer: Self-pay | Admitting: Family Medicine

## 2019-03-16 ENCOUNTER — Other Ambulatory Visit: Payer: Self-pay | Admitting: Family Medicine

## 2019-03-16 DIAGNOSIS — M109 Gout, unspecified: Secondary | ICD-10-CM

## 2019-03-18 ENCOUNTER — Ambulatory Visit: Payer: Medicare Other

## 2019-03-25 ENCOUNTER — Telehealth: Payer: Self-pay | Admitting: Family Medicine

## 2019-03-25 NOTE — Telephone Encounter (Signed)
[facility], is requesting a refill on AmLODipine 5 MG. Thanks  [facility] [phone]

## 2019-03-26 MED ORDER — AMLODIPINE BESYLATE 10 MG PO TABS: 10.0000 mg | ORAL_TABLET | Freq: Every day | ORAL | 0 refills | Status: DC

## 2019-03-26 NOTE — Telephone Encounter (Signed)
Rx has been sent in. Patient is aware. 

## 2019-03-29 ENCOUNTER — Ambulatory Visit (INDEPENDENT_AMBULATORY_CARE_PROVIDER_SITE_OTHER): Payer: Medicare Other | Admitting: Family Medicine

## 2019-03-29 ENCOUNTER — Encounter: Payer: Self-pay | Admitting: Family Medicine

## 2019-03-29 ENCOUNTER — Other Ambulatory Visit: Payer: Self-pay

## 2019-03-29 VITALS — BP 120/64 | HR 65 | Temp 97.8°F | Wt 118.8 lb

## 2019-03-29 DIAGNOSIS — I10 Essential (primary) hypertension: Secondary | ICD-10-CM

## 2019-03-29 DIAGNOSIS — E039 Hypothyroidism, unspecified: Secondary | ICD-10-CM

## 2019-03-29 DIAGNOSIS — G4700 Insomnia, unspecified: Secondary | ICD-10-CM | POA: Diagnosis not present

## 2019-03-29 DIAGNOSIS — M19012 Primary osteoarthritis, left shoulder: Secondary | ICD-10-CM

## 2019-03-29 DIAGNOSIS — M1A9XX0 Chronic gout, unspecified, without tophus (tophi): Secondary | ICD-10-CM

## 2019-03-29 DIAGNOSIS — R739 Hyperglycemia, unspecified: Secondary | ICD-10-CM

## 2019-03-29 DIAGNOSIS — M109 Gout, unspecified: Secondary | ICD-10-CM

## 2019-03-29 MED ORDER — AMLODIPINE BESYLATE 10 MG PO TABS: 10.0000 mg | ORAL_TABLET | Freq: Every day | ORAL | 3 refills | Status: DC

## 2019-03-29 MED ORDER — TEMAZEPAM 30 MG PO CAPS: 30.0000 mg | ORAL_CAPSULE | Freq: Every evening | ORAL | 1 refills | Status: DC | PRN

## 2019-03-29 MED ORDER — DICLOFENAC SODIUM 75 MG PO TBEC: 75.0000 mg | DELAYED_RELEASE_TABLET | Freq: Two times a day (BID) | ORAL | 3 refills | Status: DC

## 2019-03-29 MED ORDER — ALLOPURINOL 300 MG PO TABS: 300.0000 mg | ORAL_TABLET | Freq: Every day | ORAL | 3 refills | Status: DC

## 2019-03-29 MED ORDER — LEVOTHYROXINE SODIUM 50 MCG PO TABS: 50.0000 ug | ORAL_TABLET | Freq: Every day | ORAL | 3 refills | Status: DC

## 2019-03-29 NOTE — Progress Notes (Signed)
   Subjective:    Patient ID: Jennifer Green, female    DOB: 1940/07/23, 79 y.o.   MRN: [id number]  HPI Here to follow up on issues. She feels well in general but has a few items to discuss. Her chronic left shoulder pain is getting worse, and now it keeps her up at night ,she has been using heat and Ibuprofen with mixed results. She does not sleep well, and the Trazodone she has taken for years no longer helps much. Her BP is stable. She saw Dr. Martinique in October, and he was very pleased with her cardiac status. She takes 4 drops for glaucoma, and she is almost to the point of requiring surgery.    Review of Systems  Constitutional: Negative.   Respiratory: Negative.   Cardiovascular: Negative.   Gastrointestinal: Negative.   Genitourinary: Negative.   Musculoskeletal: Positive for arthralgias.  Psychiatric/Behavioral: Positive for sleep disturbance. Negative for dysphoric mood. The patient is not nervous/anxious.        Objective:   Physical Exam Constitutional:      Appearance: Normal appearance.  Cardiovascular:     Rate and Rhythm: Normal rate and regular rhythm.     Pulses: Normal pulses.     Heart sounds: Normal heart sounds.  Pulmonary:     Effort: Pulmonary effort is normal.     Breath sounds: Normal breath sounds.  Musculoskeletal:     Comments: The left shoulder is not tender, but there is crepitus and her ROM is limited   Neurological:     General: No focal deficit present.     Mental Status: She is alert and oriented to person, place, and time.  Psychiatric:        Mood and Affect: Mood normal.        Thought Content: Thought content normal.           Assessment & Plan:  Her HTN is stable. Her gout and GERD are stable. She has more pain in the shoulder from osteoarthritis, so she will try Diclofenac BID. For sleep try Temazepam. She will set up a time for fasting labs in the near future to check thyroid levels,  lipids, etc.  Alysia Penna, MD

## 2019-04-07 ENCOUNTER — Other Ambulatory Visit: Payer: Self-pay

## 2019-04-07 ENCOUNTER — Other Ambulatory Visit: Payer: Medicare Other

## 2019-04-08 ENCOUNTER — Other Ambulatory Visit (INDEPENDENT_AMBULATORY_CARE_PROVIDER_SITE_OTHER): Payer: Medicare Other

## 2019-04-08 DIAGNOSIS — I10 Essential (primary) hypertension: Secondary | ICD-10-CM

## 2019-04-08 DIAGNOSIS — M1A9XX0 Chronic gout, unspecified, without tophus (tophi): Secondary | ICD-10-CM | POA: Diagnosis not present

## 2019-04-08 DIAGNOSIS — E039 Hypothyroidism, unspecified: Secondary | ICD-10-CM | POA: Diagnosis not present

## 2019-04-08 DIAGNOSIS — R739 Hyperglycemia, unspecified: Secondary | ICD-10-CM | POA: Diagnosis not present

## 2019-04-08 LAB — HEPATIC FUNCTION PANEL
ALT: 20 U/L (ref 0–35)
AST: 23 U/L (ref 0–37)
Albumin: 4.2 g/dL (ref 3.5–5.2)
Alkaline Phosphatase: 65 U/L (ref 39–117)
Bilirubin, Direct: 0.1 mg/dL (ref 0.0–0.3)
Total Bilirubin: 0.7 mg/dL (ref 0.2–1.2)
Total Protein: 7 g/dL (ref 6.0–8.3)

## 2019-04-08 LAB — BASIC METABOLIC PANEL
BUN: 17 mg/dL (ref 6–23)
CO2: 33 mEq/L — ABNORMAL HIGH (ref 19–32)
Calcium: 9.7 mg/dL (ref 8.4–10.5)
Chloride: 95 mEq/L — ABNORMAL LOW (ref 96–112)
Creatinine, Ser: 0.85 mg/dL (ref 0.40–1.20)
GFR: 64.19 mL/min (ref >60.00)
Glucose, Bld: 106 mg/dL — ABNORMAL HIGH (ref 70–99)
Potassium: 4.4 mEq/L (ref 3.5–5.1)
Sodium: 136 mEq/L (ref 135–145)

## 2019-04-08 LAB — LDL CHOLESTEROL, DIRECT: Direct LDL: 121 mg/dL

## 2019-04-08 LAB — LIPID PANEL
Cholesterol: 194 mg/dL (ref 0–200)
HDL: 35.6 mg/dL — ABNORMAL LOW (ref >39.00)
NonHDL: 158.5
Total CHOL/HDL Ratio: 5
Triglycerides: 234 mg/dL — ABNORMAL HIGH (ref 0.0–149.0)
VLDL: 46.8 mg/dL — ABNORMAL HIGH (ref 0.0–40.0)

## 2019-04-08 LAB — T3, FREE: T3, Free: 3.2 pg/mL (ref 2.3–4.2)

## 2019-04-08 LAB — URIC ACID: Uric Acid, Serum: 3.9 mg/dL (ref 2.4–7.0)

## 2019-04-08 LAB — T4, FREE: Free T4: 1.06 ng/dL (ref 0.60–1.60)

## 2019-04-08 LAB — TSH: TSH: 1.35 u[IU]/mL (ref 0.35–4.50)

## 2019-04-09 LAB — HEMOGLOBIN A1C: Hgb A1c MFr Bld: 6.1 % (ref 4.6–6.5)

## 2019-04-20 ENCOUNTER — Telehealth: Payer: Self-pay | Admitting: Family Medicine

## 2019-04-20 MED ORDER — TRAZODONE HCL 50 MG PO TABS: 25.0000 mg | ORAL_TABLET | Freq: Every evening | ORAL | 3 refills | Status: DC | PRN

## 2019-04-20 NOTE — Telephone Encounter (Signed)
Done

## 2019-04-20 NOTE — Telephone Encounter (Signed)
Medication Refill: Trezodone   Pharmacy: [facility]  Phone:

## 2019-04-20 NOTE — Telephone Encounter (Signed)
Patient is aware 

## 2019-04-20 NOTE — Telephone Encounter (Signed)
Please advise. Rx is not on the patients med list.  

## 2019-06-09 ENCOUNTER — Telehealth: Payer: Self-pay | Admitting: Family Medicine

## 2019-06-09 NOTE — Telephone Encounter (Signed)
Spoke with the pharmacy. The patient is stating that her prescription should be written for 1-2 tabs at bed time. The prescription sent in on 04/20/2019 sates take 0.5-1 tablet at bed time.  Which sig is correct?

## 2019-06-09 NOTE — Telephone Encounter (Signed)
Jennifer Green is needing clarification for medication Trazodone.

## 2019-06-10 MED ORDER — TRAZODONE HCL 50 MG PO TABS: ORAL_TABLET | ORAL | 3 refills | Status: DC

## 2019-06-10 NOTE — Telephone Encounter (Signed)
Spoke with the patients pharmacy. The correct Rx was called in and they will notify the patient.

## 2019-06-10 NOTE — Telephone Encounter (Signed)
This should say take 1-2 tablets at bedtime as needed for sleep. Please refill this if needed

## 2019-06-30 ENCOUNTER — Telehealth: Payer: Self-pay | Admitting: Family Medicine

## 2019-06-30 NOTE — Chronic Care Management (AMB) (Signed)
  Chronic Care Management   Note  06/30/2019 Name: Jennifer Green MRN: [id number] DOB: 03/01/1940  [name] is a 79 y.o. year old female who is a primary care patient of [name], MD. I reached out to Berneda Rose by phone today in response to a referral sent by [name]'s PCP, [name], MD.   [name] was given information about Chronic Care Management services today including:  1. CCM service includes personalized support from designated clinical staff supervised by her physician, including individualized plan of care and coordination with other care providers 2. 24/7 contact phone numbers for assistance for urgent and routine care needs. 3. Service will only be billed when office clinical staff spend 20 minutes or more in a month to coordinate care. 4. Only one practitioner may furnish and bill the service in a calendar month. 5. The patient may stop CCM services at any time (effective at the end of the month) by phone call to the office staff.   Patient agreed to services and verbal consent obtained.   Follow up plan:   [name]

## 2019-08-03 NOTE — Chronic Care Management (AMB) (Deleted)
Chronic Care Management Pharmacy  Name: Jennifer Green  MRN: [id number] DOB: 1940/08/07   Chief Complaint/ HPI  Jennifer Green,  79 y.o. , female presents for their {Initial/Follow-up:3041532} CCM visit with the clinical pharmacist {CHL HP Upstream Pharm visit HERD:[id number]}.  PCP : [name], MD  Their chronic conditions include:   Office Visits:***  Consult Visit:***  Medications: Outpatient Encounter Medications as of 08/10/2019  Medication Sig  . allopurinol (ZYLOPRIM) 300 MG tablet Take 1 tablet (300 mg total) by mouth daily.  [name] amLODipine (NORVASC) 10 MG tablet Take 1 tablet (10 mg total) by mouth daily.  . Ascorbic Acid (VITAMIN C) 1000 MG tablet Take 1,000 mg by mouth daily.  [name] atenolol (TENORMIN) 25 MG tablet Take 1 tablet (25 mg total) by mouth daily.  . Calcium Carb-Cholecalciferol (CALCIUM 600+D3 PO) Take 1 tablet by mouth daily.  . diclofenac (VOLTAREN) 75 MG EC tablet Take 1 tablet (75 mg total) by mouth 2 (two) times daily.  [name] docusate sodium (COLACE) 100 MG capsule Take 1 capsule (100 mg total) by mouth 2 (two) times daily.  . dorzolamide-timolol (COSOPT) 22.3-6.8 MG/ML ophthalmic solution Place 1 drop into both eyes 2 (two) times daily.  . hydrochlorothiazide (HYDRODIURIL) 25 MG tablet Take 1 tablet (25 mg total) by mouth daily.  [name] levothyroxine (SYNTHROID) 50 MCG tablet Take 1 tablet (50 mcg total) by mouth daily.  . Magnesium 400 MG CAPS Take 400 mg by mouth every other day. In the morning  . Multiple Vitamins-Minerals (PRESERVISION AREDS) CAPS Take 1 capsule by mouth daily.  [name] omeprazole (PRILOSEC) 40 MG capsule TAKE ONE CAPSULE BY MOUTH ONE TIME DAILY  . pilocarpine (PILOCAR) 4 % ophthalmic solution Place 1 drop into both eyes 2 (two) times daily.  . temazepam (RESTORIL) 30 MG capsule Take 1 capsule (30 mg total) by mouth at bedtime as needed for sleep.  . traZODone (DESYREL) 50 MG tablet Take 1-2 tablets by mouth daily before bed time as  needed for sleep.   No facility-administered encounter medications on file as of 08/10/2019.     Current Diagnosis/Assessment:  Goals Addressed   None     {CHL HP [facility] Pharmacy Diagnosis/[phone]}   Hypertension   BP today is:  {CHL HP UPSTREAM Pharmacist BP [phone]}  Office blood pressures are  BP Readings from Last 3 Encounters:  03/29/19 120/64  12/03/18 116/62  12/23/17 140/82    Patient has failed these meds in the past: metoprolol, atenolol/chlorthalidone Patient is currently {CHL Controlled/Uncontrolled:[phone]} on the following medications:  . Amlodipine 10 mg 1 tablet daily . Atenolol 25 mg 1 tablet daily . HCTZ 25 mg 1 tablet daily  Patient checks BP at home {CHL HP BP Monitoring Frequency:[phone]}  Patient home BP readings are ranging: ***  We discussed {CHL HP [facility]stream Pharmacy discussion:[phone]}  Plan  Continue {CHL HP Upstream Pharmacy Plans:[phone]}     Hypothyroidism   Lab Results  Component Value Date/Time   TSH 1.35 04/08/2019 09:29 AM   TSH 0.32 (L) 04/02/2018 09:03 AM   FREET4 1.06 04/08/2019 09:29 AM   FREET4 1.34 11/28/2010 04:02 PM    Patient has failed these meds in past: none Patient is currently {CHL Controlled/Uncontrolled:[phone]} on the following medications:  . Levothyroxine 50 mcg 1 tablet daily  We discussed:  {CHL HP Upstream Pharmacy discussion:[phone]}  Plan  Continue {CHL HP Upstream Pharmacy Plans:[phone]}   GERD   Patient has failed these meds in past: *** Patient is currently {CHL Controlled/Uncontrolled:[phone]}  on the following medications:  . Omeprazole 40 mg 1 capsule daily  We discussed:  ***  Plan  Continue {CHL HP Upstream Pharmacy Plans:[phone]}    Pain   Patient has failed these meds in past: *** Patient is currently {CHL Controlled/Uncontrolled:[phone]} on the following medications:  . Diclofenac 75 mg EC 1 tablet twice  daily .  We discussed:  ***  Plan  Continue {CHL HP Upstream Pharmacy Plans:[phone]}   Gout   Patient has failed these meds in past: *** Patient is currently {CHL Controlled/Uncontrolled:[phone]} on the following medications:  . Allopurinol 300 mg 1 tablet daily  We discussed:  ***  Plan  Continue {CHL HP Upstream Pharmacy Plans:[phone]}    Insomnia   Patient has failed these meds in past: *** Patient is currently {CHL Controlled/Uncontrolled:[phone]} on the following medications:  . Temazepam 30 mg 1 capsule at bedtime PRN for sleep . Trazodone 50 mg 1-2 tablets at bedtime PRN for sleep  We discussed:  ***  Plan  Continue {CHL HP Upstream Pharmacy VIFBP:[id number]}    OTCs/Health Maintenance   Patient is currently {CHL Controlled/Uncontrolled:[phone]} on the following medications: . Cosopt 1 drop into both eyes twice daily . Preservision AREDS . Magnesium 400 mg 1 capsule every other day . Pilocarpine 4% OP 1 drop into both eyes twice daily . Docusate 100 mg 1 capsule twice daily . Vitamin C 1000 mg 1 tablet daily . Calcium+Vit D 1 tablet daily  We discussed:  ***  Plan  Continue {CHL HP [facility] Pharmacy WLKHV:[id number]}   Vaccines   Reviewed and discussed patient's vaccination history.    Immunization History  Administered Date(s) Administered  . Influenza Split 12/05/2010, 11/11/2012  . Influenza Whole 02/11/2005, 02/16/2009  . Influenza, High Dose Seasonal PF 12/29/2013, 12/04/2016, 12/16/2017  . Influenza-Unspecified 11/12/2014, 12/08/2015, 12/04/2016  . Pneumococcal Conjugate-13 12/21/2014  . Pneumococcal Polysaccharide-23 02/11/2002, 02/16/2009  . Td 02/11/2002  . Tdap 12/21/2014  . Zoster 09/09/2012    Plan  Recommended patient receive *** vaccine in *** office.    Medication Management   Pharmacy/Benefits: Adherence:  Pt endorses ***% compliance  We discussed: ***  Plan  {US Pharmacy  UKRC:[id number]}   Follow up: *** month phone visit   Geraldine Contras, PharmD Clinical Pharmacist [facility] [place] [phone]

## 2019-08-10 ENCOUNTER — Telehealth: Payer: Medicare Other

## 2019-09-15 ENCOUNTER — Other Ambulatory Visit: Payer: Self-pay | Admitting: Family Medicine

## 2019-09-17 ENCOUNTER — Other Ambulatory Visit: Payer: Self-pay | Admitting: Family Medicine

## 2019-09-27 ENCOUNTER — Other Ambulatory Visit: Payer: Self-pay | Admitting: Family Medicine

## 2019-09-27 DIAGNOSIS — I10 Essential (primary) hypertension: Secondary | ICD-10-CM

## 2019-10-04 ENCOUNTER — Other Ambulatory Visit: Payer: Self-pay | Admitting: Family Medicine

## 2019-11-08 ENCOUNTER — Other Ambulatory Visit: Payer: Self-pay

## 2019-11-08 ENCOUNTER — Ambulatory Visit (INDEPENDENT_AMBULATORY_CARE_PROVIDER_SITE_OTHER): Payer: Medicare Other

## 2019-11-08 DIAGNOSIS — Z0000 Encounter for general adult medical examination without abnormal findings: Secondary | ICD-10-CM | POA: Diagnosis not present

## 2019-11-08 NOTE — Progress Notes (Signed)
Subjective:   Jennifer Green is a 79 y.o. female who presents for Medicare Annual (Subsequent) preventive examination.   I connected with Mathews Robinsons today by telephone and verified that I am speaking with the correct person using two identifiers. Location patient: home Location provider: work Persons participating in the virtual visit: patient, provider.   I discussed the limitations, risks, security and privacy concerns of performing an evaluation and management service by telephone and the availability of in person appointments. I also discussed with the patient that there may be a patient responsible charge related to this service. The patient expressed understanding and verbally consented to this telephonic visit.    Interactive audio and video telecommunications were attempted between this provider and patient, however failed, due to patient having technical difficulties OR patient did not have access to video capability.  We continued and completed visit with audio only.     Review of Systems    N/A Cardiac Risk Factors include: advanced age (>48mn, >>9women);hypertension     Objective:    Today's Vitals   There is no height or weight on file to calculate BMI.  Advanced Directives 11/08/2019 04/28/2017 04/22/2017 01/30/2017 12/11/2015 12/01/2015 11/07/2015  Does Patient Have a Medical Advance Directive? _0  Yes -  Type of AParamedicof [place] Valley CityLiving will [place]woodLiving will [place] BridgeLiving will [place]woodLiving will - - -  Does patient want to make changes to medical advance directive? No - Patient declined No - Patient declined - No - Patient declined - - -  Copy of [place]? No - copy requested No - copy requested No - copy requested Yes - - Yes    Current Medications (verified) Outpatient Encounter Medications as of 11/08/2019    Medication Sig  . allopurinol (ZYLOPRIM) 300 MG tablet Take 1 tablet (300 mg total) by mouth daily.  .Marland KitchenamLODipine (NORVASC) 10 MG tablet Take 1 tablet (10 mg total) by mouth daily.  . Ascorbic Acid (VITAMIN C) 1000 MG tablet Take 1,000 mg by mouth daily.  .Marland Kitchenatenolol (TENORMIN) 25 MG tablet TAKE ONE TABLET BY MOUTH ONE TIME DAILY  . Calcium Carb-Cholecalciferol (CALCIUM 600+D3 PO) Take 1 tablet by mouth daily.  . diclofenac (VOLTAREN) 75 MG EC tablet Take 1 tablet (75 mg total) by mouth 2 (two) times daily.  . dorzolamide-timolol (COSOPT) 22.3-6.8 MG/ML ophthalmic solution Place 1 drop into both eyes 2 (two) times daily.  . hydrochlorothiazide (HYDRODIURIL) 25 MG tablet TAKE ONE TABLET BY MOUTH ONE TIME DAILY  . levothyroxine (SYNTHROID) 50 MCG tablet Take 1 tablet (50 mcg total) by mouth daily.  . Magnesium 400 MG CAPS Take 400 mg by mouth every other day. In the morning  . Multiple Vitamins-Minerals (PRESERVISION AREDS) CAPS Take 1 capsule by mouth daily.  .Marland Kitchenomeprazole (PRILOSEC) 40 MG capsule TAKE ONE CAPSULE BY MOUTH ONE TIME DAILY  . pilocarpine (PILOCAR) 4 % ophthalmic solution Place 1 drop into both eyes 2 (two) times daily.  . temazepam (RESTORIL) 30 MG capsule TAKE ONE CAPSULE BY MOUTH AT BEDTIME AS NEEDED FOR SLEEP  . docusate sodium (COLACE) 100 MG capsule Take 1 capsule (100 mg total) by mouth 2 (two) times daily. (Patient not taking: Reported on 11/08/2019)  . [DISCONTINUED] traZODone (DESYREL) 50 MG tablet Take 1-2 tablets by mouth daily before bed time as needed for sleep. (Patient not taking: Reported on 11/08/2019)   No  facility-administered encounter medications on file as of 11/08/2019.    Allergies (verified) Indomethacin and Promethazine hcl   History: Past Medical History:  Diagnosis Date  . Allergy   . Arthritis   . Dyspnea   . Elevated blood sugar    790.21  . GERD (gastroesophageal reflux disease)   . Glaucoma   . Gout   . Hypertension   . Hypokalemia   .  Hypomagnesemia   . Insomnia   . Murmur   . OP (osteoporosis)   . PVC (premature ventricular contraction)    Past Surgical History:  Procedure Laterality Date  . ANTERIOR CERVICAL DECOMP/DISCECTOMY FUSION N/A 04/28/2017   Procedure: ANTERIOR CERVICAL DECOMPRESSION/DISCECTOMY FUSION, INTERBODY PROSTHESIS CERVICAL SIX- CERVICAL SEVEN, EXPLORE FUSION, REMOVAL OF OLD SKYLINE PLATE CERVICAL FIVE- CERVICAL SIX;  Surgeon: Newman Pies, MD;  Location: [place];  Service: Neurosurgery;  Laterality: N/A;  . BACK SURGERY    . CERVICAL FUSION  05/2010   per Dr. Newman Pies   . EYE SURGERY     right and left   Family History  Problem Relation Age of Onset  . Pneumonia Mother   . Hypertension Mother   . Breast cancer Mother   . Heart disease Father    Social History   Socioeconomic History  . Marital status: Married    Spouse name: Not on file  . Number of children: 2  . Years of education: Not on file  . Highest education level: Not on file  Occupational History  . Occupation: retired    Comment: Optometrist  Tobacco Use  . Smoking status: Never Smoker  . Smokeless tobacco: Never Used  Substance and Sexual Activity  . Alcohol use: No  . Drug use: No  . Sexual activity: Never  Other Topics Concern  . Not on file  Social History Narrative  . Not on file   Social Determinants of Health   Financial Resource Strain: Low Risk   . Difficulty of Paying Living Expenses: Not hard at all  Food Insecurity: No Food Insecurity  . Worried About Charity fundraiser in the Last Year: Never true  . Ran Out of Food in the Last Year: Never true  Transportation Needs: No Transportation Needs  . Lack of Transportation (Medical): No  . Lack of Transportation (Non-Medical): No  Physical Activity: Inactive  . Days of Exercise per Week: 0 days  . Minutes of Exercise per Session: 0 min  Stress: No Stress Concern Present  . Feeling of Stress : Only a little  Social Connections: Socially  Integrated  . Frequency of Communication with Friends and Family: More than three times a week  . Frequency of Social Gatherings with Friends and Family: Once a week  . Attends Religious Services: More than 4 times per year  . Active Member of Clubs or Organizations: Yes  . Attends Archivist Meetings: More than 4 times per year  . Marital Status: Married    Tobacco Counseling Counseling given: Not Answered   Clinical Intake:  Pre-visit preparation completed: Yes  Pain : No/denies pain     Nutritional Risks: None Diabetes: No  How often do you need to have someone help you when you read instructions, pamphlets, or other written materials from your doctor or pharmacy?: 1 - Never What is the last grade level you completed in school?: Some College  Diabetic?No  Interpreter Needed?: No  Information entered by :: Nellie of Daily Living In your present  state of health, do you have any difficulty performing the following activities: 11/08/2019  Hearing? 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 Y  Comment has 60% vision loss in right eye due to glaucoma  Difficulty concentrating or making decisions? N  Walking or climbing stairs? N  Dressing or bathing? N  Doing errands, shopping? N  Preparing Food and eating ? N  Using the Toilet? N  In the past six months, have you accidently leaked urine? N  Do you have problems with loss of bowel control? N  Managing your Medications? N  Managing your Finances? N  Housekeeping or managing your Housekeeping? N  Some recent data might be hidden    Patient Care Team: [name], MD as PCP - General (Family Medicine) Audery Amel, Sharma Covert, [facility] (Inactive) as Pharmacist (Pharmacist)  Indicate any recent Medical Services you may have received from other than [facility] providers in the past year (date may be approximate).     Assessment:   This is a routine wellness  examination for [place].  Hearing/Vision screen  Hearing Screening   _0  _1  _2  _3  _4  _5  _6  _7  _8   Right ear:           Left ear:           Vision Screening Comments: Patient states gets eyes checked every 3 months has glaucoma   Dietary issues and exercise activities discussed: Current Exercise Habits: The patient does not participate in regular exercise at present  Goals    . Exercise 150 minutes per week (moderate activity)     Will investigate yoga for health  Look tai chi which helps with balance;     . patient     Will travel more; Look forward to [employer]      . Weight (lb) < 125 lb (56.7 kg)     Will consider options;  May leave sugar off  Dairy your intake       Depression Screen PHQ 2/9 Scores 11/08/2019 12/23/2017 12/01/2015 12/21/2014 08/26/2013  PHQ - 2 Score 0 0 0 0 0  PHQ- 9 Score 0 - - - -    Fall Risk Fall Risk  11/08/2019 01/06/2019 12/23/2017 09/12/2017 09/09/2017  Falls in the past year? 0 0 0 No Yes  Comment - Emmi Telephone Survey: data to providers prior to load - Emmi Telephone Survey: data to providers prior to load -  Number falls in past yr: 0 - - - 1  Injury with Fall? 0 - - - -  Risk for fall due to : Medication side effect - - - -  Follow up Falls evaluation completed;Falls prevention discussed - - - -    Any stairs in or around the home? No  If so, are there any without handrails? No  Home free of loose throw rugs in walkways, pet beds, electrical cords, etc? Yes  Adequate lighting in your home to reduce risk of falls? Yes   ASSISTIVE DEVICES UTILIZED TO PREVENT FALLS:  Life alert? No  Use of a cane, walker or w/c? No  Grab bars in the bathroom? No  Shower chair or bench in shower? Yes  Elevated toilet seat or a handicapped toilet? Yes    Cognitive Function: Patient declined cognitive screening during this visit. MMSE - Mini Mental State Exam 12/01/2015  Not completed: (No Data)         Immunizations Immunization History  Administered Date(s)  Administered  . Fluad Quad(high Dose 65+) 10/28/2018  . Influenza Split 12/05/2010, 11/11/2012  . Influenza Whole 02/11/2005, 02/16/2009  . Influenza, High Dose Seasonal PF 12/29/2013, 12/04/2016, 12/16/2017  . Influenza-Unspecified 11/12/2014, 12/08/2015, 12/04/2016  . PFIZER SARS-COV-2 Vaccination 03/12/2019, 04/02/2019  . Pneumococcal Conjugate-13 12/21/2014  . Pneumococcal Polysaccharide-23 02/11/2002, 02/16/2009  . Td 02/11/2002  . Tdap 12/21/2014  . Zoster 09/09/2012    TDAP status: Up to date Flu Vaccine status: Up to date Pneumococcal vaccine status: Up to date Covid-19 vaccine status: Completed vaccines  Qualifies for Shingles Vaccine? Yes   Zostavax completed Yes   Shingrix Completed?: No.    Education has been provided regarding the importance of this vaccine. Patient has been advised to call insurance company to determine out of pocket expense if they have not yet received this vaccine. Advised may also receive vaccine at local pharmacy or Health Dept. Verbalized acceptance and understanding.  Screening Tests Health Maintenance  Topic Date Due  . INFLUENZA VACCINE  09/12/2019  . TETANUS/TDAP  12/20/2024  . DEXA SCAN  Completed  . COVID-19 Vaccine  Completed  . PNA vac Low Risk Adult  Completed    Health Maintenance  Health Maintenance Due  Topic Date Due  . INFLUENZA VACCINE  09/12/2019    Colorectal cancer screening: No longer required.  Mammogram status: Completed 12/24/2018. Repeat every year Bone Density status: Completed 12/16/2006. Results reflect: Bone density results: OSTEOPENIA. Repeat every 2 years.  Lung Cancer Screening: (Low Dose CT Chest recommended if Age 20-80 years, 30 pack-year currently smoking OR have quit w/in 15years.) does not qualify.   Lung Cancer Screening Referral: N/A  Additional Screening:  Hepatitis C Screening: does not qualify;   Vision Screening: Recommended  annual ophthalmology exams for early detection of glaucoma and other disorders of the eye. Is the patient up to date with their annual eye exam?  Yes  Who is the provider or what is the name of the office in which the patient attends annual eye exams? Dr[facility] Edilia Bo at [facility]  If pt is not established with a provider, would they like to be referred to a provider to establish care? No .   Dental Screening: Recommended annual dental exams for proper oral hygiene  Community Resource Referral / Chronic Care Management: CRR required this visit?  No   CCM required this visit?  No      Plan:     I have personally reviewed and noted the following in the patient's chart:   . Medical and social history . Use of alcohol, tobacco or illicit drugs  . Current medications and supplements . Functional ability and status . Nutritional status . Physical activity . Advanced directives . List of other physicians . Hospitalizations, surgeries, and ER visits in previous 12 months . Vitals . Screenings to include cognitive, depression, and falls . Referrals and appointments  In addition, I have reviewed and discussed with patient certain preventive protocols, quality metrics, and best practice recommendations. A written personalized care plan for preventive services as well as general preventive health recommendations were provided to patient.     Ofilia Neas, LPN   [id number]/[id number]   Nurse Notes: None

## 2019-11-08 NOTE — Patient Instructions (Signed)
Jennifer Green , Thank you for taking time to come for your Medicare Wellness Visit. I appreciate your ongoing commitment to your health goals. Please review the following plan we discussed and let me know if I can assist you in the future.   Screening recommendations/referrals: Colonoscopy: No longer required Mammogram: Up to date, next due 12/24/2019 Bone Density: No longer required Recommended yearly ophthalmology/optometry visit for glaucoma screening and checkup Recommended yearly dental visit for hygiene and checkup  Vaccinations: Influenza vaccine: Currently due for Flu vaccine, you may receive at your next office visit or at your local pharmacy. Pneumococcal vaccine: Completed series Tdap vaccine: Up to date, next due 12/20/2024 Shingles vaccine: Currently due for shingrix, please contact your pharmacy to discuss cost and to receive the vaccine if interested     Advanced directives: Please bring copies of your medical advanced directives into the office so that we may scan them into your chart.  Conditions/risks identified: None   Next appointment: None    Preventive Care 65 Years and Older, Female Preventive care refers to lifestyle choices and visits with your health care provider that can promote health and wellness. What does preventive care include?  A yearly physical exam. This is also called an annual well check.  Dental exams once or twice a year.  Routine eye exams. Ask your health care provider how often you should have your eyes checked.  Personal lifestyle choices, including:  Daily care of your teeth and gums.  Regular physical activity.  Eating a healthy diet.  Avoiding tobacco and drug use.  Limiting alcohol use.  Practicing safe sex.  Taking low-dose aspirin every day.  Taking vitamin and mineral supplements as recommended by your health care provider. What happens during an annual well check? The services and screenings done by your health care  provider during your annual well check will depend on your age, overall health, lifestyle risk factors, and family history of disease. Counseling  Your health care provider may ask you questions about your:  Alcohol use.  Tobacco use.  Drug use.  Emotional well-being.  Home and relationship well-being.  Sexual activity.  Eating habits.  History of falls.  Memory and ability to understand (cognition).  Work and work Statistician.  Reproductive health. Screening  You may have the following tests or measurements:  Height, weight, and BMI.  Blood pressure.  Lipid and cholesterol levels. These may be checked every 5 years, or more frequently if you are over 25 years old.  Skin check.  Lung cancer screening. You may have this screening every year starting at age 94 if you have a 30-pack-year history of smoking and currently smoke or have quit within the past 15 years.  Fecal occult blood test (FOBT) of the stool. You may have this test every year starting at age 22.  Flexible sigmoidoscopy or colonoscopy. You may have a sigmoidoscopy every 5 years or a colonoscopy every 10 years starting at age 54.  Hepatitis C blood test.  Hepatitis B blood test.  Sexually transmitted disease (STD) testing.  Diabetes screening. This is done by checking your blood sugar (glucose) after you have not eaten for a while (fasting). You may have this done every 1-3 years.  Bone density scan. This is done to screen for osteoporosis. You may have this done starting at age 19.  Mammogram. This may be done every 1-2 years. Talk to your health care provider about how often you should have regular mammograms. Talk with your health  care provider about your test results, treatment options, and if necessary, the need for more tests. Vaccines  Your health care provider may recommend certain vaccines, such as:  Influenza vaccine. This is recommended every year.  Tetanus, diphtheria, and acellular  pertussis (Tdap, Td) vaccine. You may need a Td booster every 10 years.  Zoster vaccine. You may need this after age 75.  Pneumococcal 13-valent conjugate (PCV13) vaccine. One dose is recommended after age 17.  Pneumococcal polysaccharide (PPSV23) vaccine. One dose is recommended after age 51. Talk to your health care provider about which screenings and vaccines you need and how often you need them. This information is not intended to replace advice given to you by your health care provider. Make sure you discuss any questions you have with your health care provider. Document Released: 02/24/2015 Document Revised: 10/18/2015 Document Reviewed: 11/29/2014 Elsevier Interactive Patient Education  2017 [place] Prevention in the Home Falls can cause injuries. They can happen to people of all ages. There are many things you can do to make your home safe and to help prevent falls. What can I do on the outside of my home?  Regularly fix the edges of walkways and driveways and fix any cracks.  Remove anything that might make you trip as you walk through a door, such as a raised step or threshold.  Trim any bushes or trees on the path to your home.  Use bright outdoor lighting.  Clear any walking paths of anything that might make someone trip, such as rocks or tools.  Regularly check to see if handrails are loose or broken. Make sure that both sides of any steps have handrails.  Any raised decks and porches should have guardrails on the edges.  Have any leaves, snow, or ice cleared regularly.  Use sand or salt on walking paths during winter.  Clean up any spills in your garage right away. This includes oil or grease spills. What can I do in the bathroom?  Use night lights.  Install grab bars by the toilet and in the tub and shower. Do not use towel bars as grab bars.  Use non-skid mats or decals in the tub or shower.  If you need to sit down in the shower, use a plastic,  non-slip stool.  Keep the floor dry. Clean up any water that spills on the floor as soon as it happens.  Remove soap buildup in the tub or shower regularly.  Attach bath mats securely with double-sided non-slip rug tape.  Do not have throw rugs and other things on the floor that can make you trip. What can I do in the bedroom?  Use night lights.  Make sure that you have a light by your bed that is easy to reach.  Do not use any sheets or blankets that are too big for your bed. They should not hang down onto the floor.  Have a firm chair that has side arms. You can use this for support while you get dressed.  Do not have throw rugs and other things on the floor that can make you trip. What can I do in the kitchen?  Clean up any spills right away.  Avoid walking on wet floors.  Keep items that you use a lot in easy-to-reach places.  If you need to reach something above you, use a strong step stool that has a grab bar.  Keep electrical cords out of the way.  Do not use floor  polish or wax that makes floors slippery. If you must use wax, use non-skid floor wax.  Do not have throw rugs and other things on the floor that can make you trip. What can I do with my stairs?  Do not leave any items on the stairs.  Make sure that there are handrails on both sides of the stairs and use them. Fix handrails that are broken or loose. Make sure that handrails are as long as the stairways.  Check any carpeting to make sure that it is firmly attached to the stairs. Fix any carpet that is loose or worn.  Avoid having throw rugs at the top or bottom of the stairs. If you do have throw rugs, attach them to the floor with carpet tape.  Make sure that you have a light switch at the top of the stairs and the bottom of the stairs. If you do not have them, ask someone to add them for you. What else can I do to help prevent falls?  Wear shoes that:  Do not have high heels.  Have rubber  bottoms.  Are comfortable and fit you well.  Are closed at the toe. Do not wear sandals.  If you use a stepladder:  Make sure that it is fully opened. Do not climb a closed stepladder.  Make sure that both sides of the stepladder are locked into place.  Ask someone to hold it for you, if possible.  Clearly mark and make sure that you can see:  Any grab bars or handrails.  First and last steps.  Where the edge of each step is.  Use tools that help you move around (mobility aids) if they are needed. These include:  Canes.  Walkers.  Scooters.  Crutches.  Turn on the lights when you go into a dark area. Replace any light bulbs as soon as they burn out.  Set up your furniture so you have a clear path. Avoid moving your furniture around.  If any of your floors are uneven, fix them.  If there are any pets around you, be aware of where they are.  Review your medicines with your doctor. Some medicines can make you feel dizzy. This can increase your chance of falling. Ask your doctor what other things that you can do to help prevent falls. This information is not intended to replace advice given to you by your health care provider. Make sure you discuss any questions you have with your health care provider. Document Released: 11/24/2008 Document Revised: 07/06/2015 Document Reviewed: 03/04/2014 Elsevier Interactive Patient Education  2017 [employer].

## 2019-12-02 ENCOUNTER — Encounter: Payer: Self-pay | Admitting: Family Medicine

## 2019-12-03 NOTE — Telephone Encounter (Signed)
[employer] called and spoke to [place], [facility] Intern about the patient's message of a recall of Hydrochlorthiazide. He says [employer] has not called the patient to notify of any recalls and that's their policy. He says they have not received any emails about recalls on that medication.

## 2019-12-03 NOTE — Telephone Encounter (Signed)
I have not heard anything about this. Please call her pharmacist to get more information

## 2019-12-03 NOTE — Telephone Encounter (Signed)
I have not heard anything about this, and neither has her pharmacy. I would not worry about this unless we hear more

## 2019-12-28 NOTE — Progress Notes (Signed)
Berneda Rose Date of Birth: January 28, 1941 Medical Record #[id number]  History of Present Illness: Mrs. Jennifer Green is seen today for followup of PVCs. Extensive evaluation in October 2012 including echocardiogram and stress Myoview study were normal. She did have coronary calcification noted on chest CT in 2/13. No history of angina.   She states she is doing very well from a cardiac standpoint. She has rare palpitations. BP is well controlled. No chest pain or dyspnea. No syncope.   Current Outpatient Medications on File Prior to Visit  Medication Sig Dispense Refill  . allopurinol (ZYLOPRIM) 300 MG tablet Take 1 tablet (300 mg total) by mouth daily. 90 tablet 3  . amLODipine (NORVASC) 10 MG tablet Take 1 tablet (10 mg total) by mouth daily. 90 tablet 3  . Ascorbic Acid (VITAMIN C) 1000 MG tablet Take 1,000 mg by mouth daily.    [name] atenolol (TENORMIN) 25 MG tablet TAKE ONE TABLET BY MOUTH ONE TIME DAILY 90 tablet 3  . Calcium Carb-Cholecalciferol (CALCIUM 600+D3 PO) Take 1 tablet by mouth daily.    . diclofenac (VOLTAREN) 75 MG EC tablet Take 1 tablet (75 mg total) by mouth 2 (two) times daily. 180 tablet 3  . dorzolamide-timolol (COSOPT) 22.3-6.8 MG/ML ophthalmic solution Place 1 drop into both eyes 2 (two) times daily.    . hydrochlorothiazide (HYDRODIURIL) 25 MG tablet TAKE ONE TABLET BY MOUTH ONE TIME DAILY 90 tablet 3  . levothyroxine (SYNTHROID) 50 MCG tablet Take 1 tablet (50 mcg total) by mouth daily. 90 tablet 3  . Magnesium 400 MG CAPS Take 400 mg by mouth every other day. In the morning    . Multiple Vitamins-Minerals (PRESERVISION AREDS) CAPS Take 1 capsule by mouth daily.    . Netarsudil-Latanoprost (ROCKLATAN) 0.02-0.005 % SOLN Place 1 drop into both eyes nightly.    [name] omeprazole (PRILOSEC) 40 MG capsule TAKE ONE CAPSULE BY MOUTH ONE TIME DAILY 30 capsule 5  . pilocarpine (PILOCAR) 4 % ophthalmic solution Place 1 drop into both eyes 2 (two) times daily.    . temazepam  (RESTORIL) 30 MG capsule TAKE ONE CAPSULE BY MOUTH AT BEDTIME AS NEEDED FOR SLEEP 90 capsule 1   No current facility-administered medications on file prior to visit.    Allergies  Allergen Reactions  . Indomethacin     Rapid Heart Beat  . Promethazine Hcl Other (See Comments)    Couldn't stay still    Past Medical History:  Diagnosis Date  . Allergy   . Arthritis   . Dyspnea   . Elevated blood sugar    790.21  . GERD (gastroesophageal reflux disease)   . Glaucoma   . Gout   . Hypertension   . Hypokalemia   . Hypomagnesemia   . Insomnia   . Murmur   . OP (osteoporosis)   . PVC (premature ventricular contraction)     Past Surgical History:  Procedure Laterality Date  . ANTERIOR CERVICAL DECOMP/DISCECTOMY FUSION N/A 04/28/2017   Procedure: ANTERIOR CERVICAL DECOMPRESSION/DISCECTOMY FUSION, INTERBODY PROSTHESIS CERVICAL SIX- CERVICAL SEVEN, EXPLORE FUSION, REMOVAL OF OLD SKYLINE PLATE CERVICAL FIVE- CERVICAL SIX;  Surgeon: Newman Pies, MD;  Location: [facility];  Service: Neurosurgery;  Laterality: N/A;  . BACK SURGERY    . CERVICAL FUSION  05/2010   per Dr. Newman Pies   . EYE SURGERY     right and left    Social History   Tobacco Use  Smoking Status Never Smoker  Smokeless Tobacco Never Used  Social History   Substance and Sexual Activity  Alcohol Use No    Family History  Problem Relation Age of Onset  . Pneumonia Mother   . Hypertension Mother   . Breast cancer Mother   . Heart disease Father     Review of Systems: As noted in history of present illness..  All other systems were reviewed and are negative.  Physical Exam: BP 138/72   Pulse 65   Ht 5' (1.524 m)   Wt 122 lb 3.2 oz (55.4 kg)   BMI 23.87 kg/m   GENERAL:  Well appearing WF in NAD HEENT:  PERRL, EOMI, sclera are clear. Oropharynx is clear. NECK:  No jugular venous distention, carotid upstroke brisk and symmetric, no bruits, no thyromegaly or adenopathy LUNGS:  Clear to  auscultation bilaterally CHEST:  Unremarkable HEART:  RRR,  PMI not displaced or sustained,S1 and S2 within normal limits, no S3, no S4: no clicks, no rubs, no murmurs ABD:  Soft, nontender. BS +, no masses or bruits. No hepatomegaly, no splenomegaly EXT:  2 + pulses throughout, no edema, no cyanosis no clubbing SKIN:  Warm and dry.  No rashes NEURO:  Alert and oriented x 3. Cranial nerves II through XII intact. PSYCH:  Cognitively intact    LABORATORY DATA:   Lab Results  Component Value Date   WBC 4.5 01/01/2018   HGB 13.9 01/01/2018   HCT 40.5 01/01/2018   PLT 245.0 01/01/2018   GLUCOSE 106 (H) 04/08/2019   CHOL 194 04/08/2019   TRIG 234.0 (H) 04/08/2019   HDL 35.60 (L) 04/08/2019   LDLDIRECT 121.0 04/08/2019   LDLCALC 106 (H) 01/01/2018   ALT 20 04/08/2019   AST 23 04/08/2019   NA 136 04/08/2019   K 4.4 04/08/2019   CL 95 (L) 04/08/2019   CREATININE 0.85 04/08/2019   BUN 17 04/08/2019   CO2 33 (H) 04/08/2019   TSH 1.35 04/08/2019   HGBA1C 6.1 04/08/2019   Ecg today shows NSR rate 65. Occ. PACs. Otherwise normal.  I have personally reviewed and interpreted this study.   Assessment / Plan: 1. PVCs/PACs chronic and benign based on prior cardiac work up. Continue beta blocker therapy.   2. Coronary calcification with normal myoview study 2012. No symptoms of angina. Recommend risk factor modification. LDL 121. She should be on a statin. Will start Crestor 10 mg daily. Repeat lab 3 months.    3. HTN now well controlled with recent medication changes.  Follow up in one year.

## 2019-12-30 ENCOUNTER — Ambulatory Visit (INDEPENDENT_AMBULATORY_CARE_PROVIDER_SITE_OTHER): Payer: Medicare Other | Admitting: Cardiology

## 2019-12-30 ENCOUNTER — Encounter: Payer: Self-pay | Admitting: Cardiology

## 2019-12-30 VITALS — BP 138/72 | HR 65 | Ht 60.0 in | Wt 122.2 lb

## 2019-12-30 DIAGNOSIS — E7800 Pure hypercholesterolemia, unspecified: Secondary | ICD-10-CM | POA: Diagnosis not present

## 2019-12-30 DIAGNOSIS — I2510 Atherosclerotic heart disease of native coronary artery without angina pectoris: Secondary | ICD-10-CM

## 2019-12-30 DIAGNOSIS — I10 Essential (primary) hypertension: Secondary | ICD-10-CM

## 2019-12-30 DIAGNOSIS — I493 Ventricular premature depolarization: Secondary | ICD-10-CM | POA: Diagnosis not present

## 2019-12-30 MED ORDER — ROSUVASTATIN CALCIUM 10 MG PO TABS: 10.0000 mg | ORAL_TABLET | Freq: Every day | ORAL | 3 refills | Status: DC

## 2019-12-30 NOTE — Patient Instructions (Signed)
Start Crestor 10 mg daily for cholesterol  We will check blood work in 3 months

## 2020-01-19 ENCOUNTER — Other Ambulatory Visit: Payer: Self-pay | Admitting: Family Medicine

## 2020-02-18 ENCOUNTER — Other Ambulatory Visit: Payer: Self-pay | Admitting: Family Medicine

## 2020-03-22 ENCOUNTER — Other Ambulatory Visit: Payer: Self-pay | Admitting: Family Medicine

## 2020-03-23 NOTE — Telephone Encounter (Signed)
Last office visit---03/29/2019 Last refill---09/17/2019

## 2020-03-27 NOTE — Telephone Encounter (Signed)
Patient called to follow up on this Rx because she really needs it and you're not supposed to run out of this medication.  Please advise

## 2020-04-05 ENCOUNTER — Other Ambulatory Visit: Payer: Self-pay | Admitting: Family Medicine

## 2020-04-09 ENCOUNTER — Other Ambulatory Visit: Payer: Self-pay | Admitting: Family Medicine

## 2020-04-12 ENCOUNTER — Other Ambulatory Visit: Payer: Self-pay | Admitting: Family Medicine

## 2020-04-12 DIAGNOSIS — M109 Gout, unspecified: Secondary | ICD-10-CM

## 2020-04-17 ENCOUNTER — Other Ambulatory Visit: Payer: Self-pay | Admitting: Family Medicine

## 2020-04-17 DIAGNOSIS — M109 Gout, unspecified: Secondary | ICD-10-CM

## 2020-05-01 ENCOUNTER — Ambulatory Visit (INDEPENDENT_AMBULATORY_CARE_PROVIDER_SITE_OTHER): Payer: Medicare Other | Admitting: Family Medicine

## 2020-05-01 ENCOUNTER — Encounter: Payer: Self-pay | Admitting: Family Medicine

## 2020-05-01 ENCOUNTER — Other Ambulatory Visit: Payer: Self-pay

## 2020-05-01 VITALS — BP 108/60 | HR 56 | Temp 98.3°F | Ht 60.0 in | Wt 120.4 lb

## 2020-05-01 DIAGNOSIS — K219 Gastro-esophageal reflux disease without esophagitis: Secondary | ICD-10-CM | POA: Diagnosis not present

## 2020-05-01 DIAGNOSIS — E039 Hypothyroidism, unspecified: Secondary | ICD-10-CM | POA: Diagnosis not present

## 2020-05-01 DIAGNOSIS — R739 Hyperglycemia, unspecified: Secondary | ICD-10-CM | POA: Diagnosis not present

## 2020-05-01 DIAGNOSIS — I10 Essential (primary) hypertension: Secondary | ICD-10-CM

## 2020-05-01 DIAGNOSIS — M1A9XX0 Chronic gout, unspecified, without tophus (tophi): Secondary | ICD-10-CM | POA: Diagnosis not present

## 2020-05-01 DIAGNOSIS — R609 Edema, unspecified: Secondary | ICD-10-CM

## 2020-05-01 DIAGNOSIS — G4700 Insomnia, unspecified: Secondary | ICD-10-CM

## 2020-05-01 LAB — CBC WITH DIFFERENTIAL/PLATELET
Basophils Absolute: 0 10*3/uL (ref 0.0–0.1)
Basophils Relative: 0.6 % (ref 0.0–3.0)
Eosinophils Absolute: 0.1 10*3/uL (ref 0.0–0.7)
Eosinophils Relative: 0.9 % (ref 0.0–5.0)
HCT: 39.6 % (ref 36.0–46.0)
Hemoglobin: 13.7 g/dL (ref 12.0–15.0)
Lymphocytes Relative: 32.4 % (ref 12.0–46.0)
Lymphs Abs: 2.3 10*3/uL (ref 0.7–4.0)
MCHC: 34.6 g/dL (ref 30.0–36.0)
MCV: 96.5 fl (ref 78.0–100.0)
Monocytes Absolute: 0.6 10*3/uL (ref 0.1–1.0)
Monocytes Relative: 8.4 % (ref 3.0–12.0)
Neutro Abs: 4 10*3/uL (ref 1.4–7.7)
Neutrophils Relative %: 57.7 % (ref 43.0–77.0)
Platelets: 255 10*3/uL (ref 150.0–400.0)
RBC: 4.1 Mil/uL (ref 3.87–5.11)
RDW: 14.3 % (ref 11.5–15.5)
WBC: 7 10*3/uL (ref 4.0–10.5)

## 2020-05-01 LAB — HEPATIC FUNCTION PANEL
ALT: 13 U/L (ref 0–35)
AST: 17 U/L (ref 0–37)
Albumin: 4.5 g/dL (ref 3.5–5.2)
Alkaline Phosphatase: 55 U/L (ref 39–117)
Bilirubin, Direct: 0.2 mg/dL (ref 0.0–0.3)
Total Bilirubin: 0.6 mg/dL (ref 0.2–1.2)
Total Protein: 6.7 g/dL (ref 6.0–8.3)

## 2020-05-01 LAB — BASIC METABOLIC PANEL
BUN: 21 mg/dL (ref 6–23)
CO2: 29 mEq/L (ref 19–32)
Calcium: 9.8 mg/dL (ref 8.4–10.5)
Chloride: 99 mEq/L (ref 96–112)
Creatinine, Ser: 0.88 mg/dL (ref 0.40–1.20)
GFR: 61.36 mL/min (ref >60.00)
Glucose, Bld: 118 mg/dL — ABNORMAL HIGH (ref 70–99)
Potassium: 4.2 mEq/L (ref 3.5–5.1)
Sodium: 139 mEq/L (ref 135–145)

## 2020-05-01 LAB — LIPID PANEL
Cholesterol: 111 mg/dL (ref 0–200)
HDL: 42.6 mg/dL (ref >39.00)
LDL Cholesterol: 44 mg/dL (ref 0–99)
NonHDL: 68.64
Total CHOL/HDL Ratio: 3
Triglycerides: 122 mg/dL (ref 0.0–149.0)
VLDL: 24.4 mg/dL (ref 0.0–40.0)

## 2020-05-01 LAB — HEMOGLOBIN A1C: Hgb A1c MFr Bld: 6 % (ref 4.6–6.5)

## 2020-05-01 LAB — T3, FREE: T3, Free: 3.5 pg/mL (ref 2.3–4.2)

## 2020-05-01 LAB — TSH: TSH: 1.07 u[IU]/mL (ref 0.35–4.50)

## 2020-05-01 LAB — MAGNESIUM: Magnesium: 1.6 mg/dL (ref 1.5–2.5)

## 2020-05-01 LAB — T4, FREE: Free T4: 1.28 ng/dL (ref 0.60–1.60)

## 2020-05-01 LAB — URIC ACID: Uric Acid, Serum: 6.7 mg/dL (ref 2.4–7.0)

## 2020-05-01 MED ORDER — DICLOFENAC SODIUM 75 MG PO TBEC: 75.0000 mg | DELAYED_RELEASE_TABLET | Freq: Two times a day (BID) | ORAL | 3 refills | Status: AC

## 2020-05-01 NOTE — Progress Notes (Signed)
Subjective:    Patient ID: Jennifer Green, female    DOB: Dec 27, 1940, 80 y.o.   MRN: [id number]  HPI Here to follow up on issues. She has no concerns today. She is recovering from a right ankle fracture, and she just resumed driving after not being able to drive for the past 7 weeks. Her BP is stable. She has not had a gout attack for years. She sees Dr. Martinique regularly for cardiology visits. She sleeps well with Temazepam. Her glaucoma is stable so far by using 4 different eye drops.    Review of Systems  Constitutional: Negative.   HENT: Negative.   Eyes: Negative.   Respiratory: Negative.   Cardiovascular: Negative.   Gastrointestinal: Negative.   Genitourinary: Negative for decreased urine volume, difficulty urinating, dyspareunia, dysuria, enuresis, flank pain, frequency, hematuria, pelvic pain and urgency.  Musculoskeletal: Positive for arthralgias.  Skin: Negative.   Neurological: Negative.   Psychiatric/Behavioral: Negative.        Objective:   Physical Exam Constitutional:      General: She is not in acute distress.    Appearance: She is well-developed.  HENT:     Head: Normocephalic and atraumatic.     Right Ear: External ear normal.     Left Ear: External ear normal.     Nose: Nose normal.     Mouth/Throat:     Pharynx: No oropharyngeal exudate.  Eyes:     General: No scleral icterus.    Conjunctiva/sclera: Conjunctivae normal.     Pupils: Pupils are equal, round, and reactive to light.  Neck:     Thyroid: No thyromegaly.     Vascular: No JVD.  Cardiovascular:     Rate and Rhythm: Normal rate and regular rhythm.     Heart sounds: Normal heart sounds. No murmur heard. No friction rub. No gallop.   Pulmonary:     Effort: Pulmonary effort is normal. No respiratory distress.     Breath sounds: Normal breath sounds. No wheezing or rales.  Chest:     Chest wall: No tenderness.  Abdominal:     General: Bowel sounds are normal. There is no distension.      Palpations: Abdomen is soft. There is no mass.     Tenderness: There is no abdominal tenderness. There is no guarding or rebound.  Musculoskeletal:        General: No tenderness. Normal range of motion.     Cervical back: Normal range of motion and neck supple.  Lymphadenopathy:     Cervical: No cervical adenopathy.  Skin:    General: Skin is warm and dry.     Findings: No erythema or rash.  Neurological:     Mental Status: She is alert and oriented to person, place, and time.     Cranial Nerves: No cranial nerve deficit.     Motor: No abnormal muscle tone.     Coordination: Coordination normal.     Deep Tendon Reflexes: Reflexes are normal and symmetric. Reflexes normal.  Psychiatric:        Behavior: Behavior normal.        Thought Content: Thought content normal.        Judgment: Judgment normal.           Assessment & Plan:  Her HTN and gout are stable. The glaucoma is borderline stable. We will get fasting labs to check lipids, an A1c, etc. She will be getting a mammogram soon.  Alysia Penna, MD

## 2020-06-29 ENCOUNTER — Other Ambulatory Visit: Payer: Self-pay | Admitting: Family Medicine

## 2020-06-29 DIAGNOSIS — Z1231 Encounter for screening mammogram for malignant neoplasm of breast: Secondary | ICD-10-CM

## 2020-07-11 ENCOUNTER — Other Ambulatory Visit: Payer: Self-pay | Admitting: Family Medicine

## 2020-07-11 DIAGNOSIS — M109 Gout, unspecified: Secondary | ICD-10-CM

## 2020-08-28 ENCOUNTER — Other Ambulatory Visit: Payer: Self-pay

## 2020-08-28 ENCOUNTER — Ambulatory Visit
Admission: RE | Admit: 2020-08-28 | Discharge: 2020-08-28 | Disposition: A | Discharge status: Home / Self Care | Payer: Medicare Other | Source: Ambulatory Visit | Attending: Family Medicine | Admitting: Family Medicine

## 2020-08-28 DIAGNOSIS — Z1231 Encounter for screening mammogram for malignant neoplasm of breast: Secondary | ICD-10-CM

## 2020-09-12 ENCOUNTER — Other Ambulatory Visit: Payer: Self-pay | Admitting: Family Medicine

## 2020-09-16 ENCOUNTER — Other Ambulatory Visit: Payer: Self-pay | Admitting: Family Medicine

## 2020-09-23 ENCOUNTER — Other Ambulatory Visit: Payer: Self-pay | Admitting: Family Medicine

## 2020-09-26 MED ORDER — TEMAZEPAM 30 MG PO CAPS: 30.0000 mg | ORAL_CAPSULE | Freq: Every evening | ORAL | 0 refills | Status: DC | PRN

## 2020-09-26 NOTE — Telephone Encounter (Signed)
I sent in 30 days of medication and Dr. Barbie Banner absence further refills through Dr. Sarajane Jews

## 2020-09-26 NOTE — Telephone Encounter (Signed)
Pt PCP is unavailable Pt LOV was 05/01/2020 Last refill was done 03/27/2020 for 90 tablets with 1 refill Please advise

## 2020-09-28 ENCOUNTER — Other Ambulatory Visit: Payer: Self-pay | Admitting: Family Medicine

## 2020-09-28 DIAGNOSIS — M109 Gout, unspecified: Secondary | ICD-10-CM

## 2020-09-28 DIAGNOSIS — I10 Essential (primary) hypertension: Secondary | ICD-10-CM

## 2020-10-08 ENCOUNTER — Other Ambulatory Visit: Payer: Self-pay | Admitting: Family Medicine

## 2020-10-08 DIAGNOSIS — I10 Essential (primary) hypertension: Secondary | ICD-10-CM

## 2020-10-17 NOTE — Telephone Encounter (Signed)
Let's have her go off Crestor now and see if symptoms resolve. May take 3 weeks to see. If symptoms resolve we will need to readdress what to do for her cholesterol. Have her report back to [place] in 3 weeks.  Billie Intriago Martinique MD, [facility]

## 2020-10-22 ENCOUNTER — Other Ambulatory Visit: Payer: Self-pay | Admitting: Internal Medicine

## 2020-10-23 NOTE — Telephone Encounter (Signed)
Forwarding  request to PCP Dr Sarajane Jews

## 2020-11-08 ENCOUNTER — Ambulatory Visit (INDEPENDENT_AMBULATORY_CARE_PROVIDER_SITE_OTHER): Payer: Medicare Other

## 2020-11-08 ENCOUNTER — Other Ambulatory Visit: Payer: Self-pay

## 2020-11-08 VITALS — BP 142/70 | HR 81 | Temp 98.1°F | Ht 60.0 in | Wt 123.0 lb

## 2020-11-08 DIAGNOSIS — Z0000 Encounter for general adult medical examination without abnormal findings: Secondary | ICD-10-CM

## 2020-11-08 DIAGNOSIS — Z780 Asymptomatic menopausal state: Secondary | ICD-10-CM | POA: Diagnosis not present

## 2020-11-08 NOTE — Progress Notes (Signed)
Subjective:   Jennifer Green is a 80 y.o. female who presents for Medicare Annual (Subsequent) preventive examination.  Review of Systems    N/a       Objective:    There were no vitals filed for this visit. There is no height or weight on file to calculate BMI.  Advanced Directives 11/08/2019 04/28/2017 04/22/2017 01/30/2017 12/11/2015 12/01/2015 11/07/2015  Does Patient Have a Medical Advance Directive? Yes Yes Yes Yes Yes Yes -  Type of Paramedic of [place];Living will [place];Living will [place];Living will [place];Living will - - -  Does patient want to make changes to medical advance directive? No - Patient declined No - Patient declined - No - Patient declined - - -  Copy of [place] in Chart? No - copy requested No - copy requested No - copy requested Yes - - Yes    Current Medications (verified) Outpatient Encounter Medications as of 11/08/2020  Medication Sig   allopurinol (ZYLOPRIM) 300 MG tablet TAKE ONE TABLET BY MOUTH ONE TIME DAILY   amLODipine (NORVASC) 10 MG tablet TAKE ONE TABLET BY MOUTH ONE TIME DAILY   Ascorbic Acid (VITAMIN C) 1000 MG tablet Take 1,000 mg by mouth daily.   atenolol (TENORMIN) 25 MG tablet TAKE ONE TABLET BY MOUTH ONE TIME DAILY   Calcium Carb-Cholecalciferol (CALCIUM 600+D3 PO) Take 1 tablet by mouth daily.   diclofenac (VOLTAREN) 75 MG EC tablet Take 1 tablet (75 mg total) by mouth 2 (two) times daily.   dorzolamide-timolol (COSOPT) 22.3-6.8 MG/ML ophthalmic solution Place 1 drop into both eyes 2 (two) times daily.   hydrochlorothiazide (HYDRODIURIL) 25 MG tablet TAKE ONE TABLET BY MOUTH ONE TIME DAILY   levothyroxine (SYNTHROID) 50 MCG tablet TAKE ONE TABLET BY MOUTH ONE TIME DAILY   Magnesium 400 MG CAPS Take 400 mg by mouth every other day. In the morning   Multiple Vitamins-Minerals (PRESERVISION AREDS) CAPS Take 1 capsule by  mouth daily.   Netarsudil-Latanoprost (ROCKLATAN) 0.02-0.005 % SOLN Place 1 drop into both eyes nightly.   omeprazole (PRILOSEC) 40 MG capsule TAKE ONE CAPSULE BY MOUTH ONE TIME DAILY   pilocarpine (PILOCAR) 4 % ophthalmic solution Place 1 drop into both eyes 2 (two) times daily.   rosuvastatin (CRESTOR) 10 MG tablet Take 1 tablet (10 mg total) by mouth daily.   temazepam (RESTORIL) 30 MG capsule TAKE ONE CAPSULE BY MOUTH AT BEDTIME AS NEEDED FOR SLEEP   No facility-administered encounter medications on file as of 11/08/2020.    Allergies (verified) Indomethacin and Promethazine hcl   History: Past Medical History:  Diagnosis Date   Allergy    Arthritis    Dyspnea    Elevated blood sugar    790.21   GERD (gastroesophageal reflux disease)    Glaucoma    Gout    Hypertension    Hypokalemia    Hypomagnesemia    Insomnia    Murmur    OP (osteoporosis)    PVC (premature ventricular contraction)    Past Surgical History:  Procedure Laterality Date   ANTERIOR CERVICAL DECOMP/DISCECTOMY FUSION N/A 04/28/2017   Procedure: ANTERIOR CERVICAL DECOMPRESSION/DISCECTOMY FUSION, INTERBODY PROSTHESIS CERVICAL SIX- CERVICAL SEVEN, EXPLORE FUSION, REMOVAL OF OLD SKYLINE PLATE CERVICAL FIVE- CERVICAL SIX;  Surgeon: Newman Pies, MD;  Location: [facility];  Service: Neurosurgery;  Laterality: N/A;   BACK SURGERY     CERVICAL FUSION  05/2010   per Dr. Dellis Filbert  Jenkins    EYE SURGERY     right and left   Family History  Problem Relation Age of Onset   Pneumonia Mother    Hypertension Mother    Breast cancer Mother    Heart disease Father    Social History   Socioeconomic History   Marital status: Married    Spouse name: Not on file   Number of children: 2   Years of education: Not on file   Highest education level: Not on file  Occupational History   Occupation: retired    Comment: Optometrist  Tobacco Use   Smoking status: Never   Smokeless tobacco: Never  Substance and Sexual  Activity   Alcohol use: No   Drug use: No   Sexual activity: Never  Other Topics Concern   Not on file  Social History Narrative   Not on file   Social Determinants of Health   Financial Resource Strain: Low Risk    Difficulty of Paying Living Expenses: Not hard at all  Food Insecurity: No Food Insecurity   Worried About Charity fundraiser in the Last Year: Never true   [place] in the Last Year: Never true  Transportation Needs: No Transportation Needs   Lack of Transportation (Medical): No   Lack of Transportation (Non-Medical): No  Physical Activity: Inactive   Days of Exercise per Week: 0 days   Minutes of Exercise per Session: 0 min  Stress: No Stress Concern Present   Feeling of Stress : Only a little  Social Connections: Engineer, building services of Communication with Friends and Family: More than three times a week   Frequency of Social Gatherings with Friends and Family: Once a week   Attends Religious Services: More than 4 times per year   Active Member of [employer] or Organizations: Yes   Attends Music therapist: More than 4 times per year   Marital Status: Married    Tobacco Counseling Counseling given: Not Answered   Clinical Intake:                 Diabetic?no         Activities of Daily Living No flowsheet data found.  Patient Care Team: [name], MD as PCP - General (Family Medicine) Audery Amel Sharma Covert, [facility] (Inactive) as Pharmacist (Pharmacist)  Indicate any recent Medical Services you may have received from other than [facility] providers in the past year (date may be approximate).     Assessment:   This is a routine wellness examination for [place].  Hearing/Vision screen No results found.  Dietary issues and exercise activities discussed:     Goals Addressed   None    Depression Screen PHQ 2/9 Scores 05/01/2020 11/08/2019 12/23/2017 12/01/2015 12/21/2014 08/26/2013  PHQ - 2 Score 1 0 0 0 0 0  PHQ- 9  Score 2 0 - - - -    Fall Risk Fall Risk  05/01/2020 11/08/2019 01/06/2019 12/23/2017 09/12/2017  Falls in the past year? 1 0 0 0 No  Comment - - Emmi Telephone Survey: data to providers prior to load - Emmi Telephone Survey: data to providers prior to load  Number falls in past yr: 0 0 - - -  Injury with Fall? 1 0 - - -  Comment Broke right foot - - - -  Risk for fall due to : Other (Comment) Medication side effect - - -  Follow up - Falls evaluation completed;Falls prevention  discussed - - -    FALL RISK PREVENTION PERTAINING TO THE HOME:  Any stairs in or around the home? No  If so, are there any without handrails? No  Home free of loose throw rugs in walkways, pet beds, electrical cords, etc? Yes  Adequate lighting in your home to reduce risk of falls? Yes   ASSISTIVE DEVICES UTILIZED TO PREVENT FALLS:  Life alert? No  Use of a cane, walker or w/c? No  Grab bars in the bathroom? Yes  Shower chair or bench in shower? Yes  Elevated toilet seat or a handicapped toilet? No    Cognitive Function:  Normal cognitive status assessed by direct observation by this Nurse Health Advisor. No abnormalities found.   MMSE - Mini Mental State Exam 12/01/2015  Not completed: (No Data)        Immunizations Immunization History  Administered Date(s) Administered   Fluad Quad(high Dose 65+) 10/28/2018   Influenza Split 12/05/2010, 11/11/2012   Influenza Whole 02/11/2005, 02/16/2009   Influenza, High Dose Seasonal PF 12/29/2013, 12/04/2016, 12/16/2017   Influenza-Unspecified 11/12/2014, 12/08/2015, 12/04/2016, 12/10/2019   PFIZER(Purple Top)SARS-COV-2 Vaccination 03/12/2019, 04/02/2019, 11/19/2019   Pneumococcal Conjugate-13 12/21/2014   Pneumococcal Polysaccharide-23 02/11/2002, 02/16/2009   Td 02/11/2002   Tdap 12/21/2014   Zoster, Live 09/09/2012    TDAP status: Up to date  Flu Vaccine status: Up to date  Pneumococcal vaccine status: Up to date  Covid-19 vaccine status:  Completed vaccines  Qualifies for Shingles Vaccine? Yes   Zostavax completed Yes   Shingrix Completed?: Yes  Screening Tests Health Maintenance  Topic Date Due   Zoster Vaccines- Shingrix (1 of 2) Never done   COVID-19 Vaccine (4 - Booster for Pfizer series) 03/21/2020   INFLUENZA VACCINE  09/11/2020   TETANUS/TDAP  12/20/2024   DEXA SCAN  Completed   HPV VACCINES  Aged Out    Health Maintenance  Health Maintenance Due  Topic Date Due   Zoster Vaccines- Shingrix (1 of 2) Never done   COVID-19 Vaccine (4 - Booster for Pfizer series) 03/21/2020   INFLUENZA VACCINE  09/11/2020    Colorectal cancer screening: No longer required.   Mammogram status: No longer required due to age.  Bone Density status: Ordered 11/08/2020. Pt provided with contact info and advised to call to schedule appt.  Lung Cancer Screening: (Low Dose CT Chest recommended if Age 60-80 years, 30 pack-year currently smoking OR have quit w/in 15years.) does not qualify.   Lung Cancer Screening Referral: n/a  Additional Screening:  Hepatitis C Screening: does not qualify;   Vision Screening: Recommended annual ophthalmology exams for early detection of glaucoma and other disorders of the eye. Is the patient up to date with their annual eye exam?  Yes  Who is the provider or what is the name of the office in which the patient attends annual eye exams? Dr,Bond  If pt is not established with a provider, would they like to be referred to a provider to establish care? Yes .   Dental Screening: Recommended annual dental exams for proper oral hygiene  Community Resource Referral / Chronic Care Management: CRR required this visit?  No   CCM required this visit?  No      Plan:     I have personally reviewed and noted the following in the patient's chart:   Medical and social history Use of alcohol, tobacco or illicit drugs  Current medications and supplements including opioid prescriptions.  Functional  ability and status Nutritional  status Physical activity Advanced directives List of other physicians Hospitalizations, surgeries, and ER visits in previous 12 months Vitals Screenings to include cognitive, depression, and falls Referrals and appointments  In addition, I have reviewed and discussed with patient certain preventive protocols, quality metrics, and best practice recommendations. A written personalized care plan for preventive services as well as general preventive health recommendations were provided to patient.     Randel Pigg, LPN   0/13/1438   Nurse Notes: none

## 2020-11-08 NOTE — Patient Instructions (Signed)
Jennifer Green , Thank you for taking time to come for your Medicare Wellness Visit. I appreciate your ongoing commitment to your health goals. Please review the following plan we discussed and let me know if I can assist you in the future.   Screening recommendations/referrals: Colonoscopy: no longer required  Mammogram: no longer required  Bone Density: ordered 11/08/2020 Recommended yearly ophthalmology/optometry visit for glaucoma screening and checkup Recommended yearly dental visit for hygiene and checkup  Vaccinations: Influenza vaccine: due in fall 2022  Pneumococcal vaccine: completed series  Tdap vaccine: 12/21/2014 Shingles vaccine: due 2nd dose     Advanced directives: none   Conditions/risks identified: none   Next appointment: none    Preventive Care 64 Years and Older, Female Preventive care refers to lifestyle choices and visits with your health care provider that can promote health and wellness. What does preventive care include? A yearly physical exam. This is also called an annual well check. Dental exams once or twice a year. Routine eye exams. Ask your health care provider how often you should have your eyes checked. Personal lifestyle choices, including: Daily care of your teeth and gums. Regular physical activity. Eating a healthy diet. Avoiding tobacco and drug use. Limiting alcohol use. Practicing safe sex. Taking low-dose aspirin every day. Taking vitamin and mineral supplements as recommended by your health care provider. What happens during an annual well check? The services and screenings done by your health care provider during your annual well check will depend on your age, overall health, lifestyle risk factors, and family history of disease. Counseling  Your health care provider may ask you questions about your: Alcohol use. Tobacco use. Drug use. Emotional well-being. Home and relationship well-being. Sexual activity. Eating habits. History  of falls. Memory and ability to understand (cognition). Work and work Statistician. Reproductive health. Screening  You may have the following tests or measurements: Height, weight, and BMI. Blood pressure. Lipid and cholesterol levels. These may be checked every 5 years, or more frequently if you are over 77 years old. Skin check. Lung cancer screening. You may have this screening every year starting at age 89 if you have a 30-pack-year history of smoking and currently smoke or have quit within the past 15 years. Fecal occult blood test (FOBT) of the stool. You may have this test every year starting at age 47. Flexible sigmoidoscopy or colonoscopy. You may have a sigmoidoscopy every 5 years or a colonoscopy every 10 years starting at age 63. Hepatitis C blood test. Hepatitis B blood test. Sexually transmitted disease (STD) testing. Diabetes screening. This is done by checking your blood sugar (glucose) after you have not eaten for a while (fasting). You may have this done every 1-3 years. Bone density scan. This is done to screen for osteoporosis. You may have this done starting at age 30. Mammogram. This may be done every 1-2 years. Talk to your health care provider about how often you should have regular mammograms. Talk with your health care provider about your test results, treatment options, and if necessary, the need for more tests. Vaccines  Your health care provider may recommend certain vaccines, such as: Influenza vaccine. This is recommended every year. Tetanus, diphtheria, and acellular pertussis (Tdap, Td) vaccine. You may need a Td booster every 10 years. Zoster vaccine. You may need this after age 60. Pneumococcal 13-valent conjugate (PCV13) vaccine. One dose is recommended after age 105. Pneumococcal polysaccharide (PPSV23) vaccine. One dose is recommended after age 43. Talk to your health  care provider about which screenings and vaccines you need and how often you need  them. This information is not intended to replace advice given to you by your health care provider. Make sure you discuss any questions you have with your health care provider. Document Released: 02/24/2015 Document Revised: 10/18/2015 Document Reviewed: 11/29/2014 Elsevier Interactive Patient Education  2017 [employer] Prevention in the Home Falls can cause injuries. They can happen to people of all ages. There are many things you can do to make your home safe and to help prevent falls. What can I do on the outside of my home? Regularly fix the edges of walkways and driveways and fix any cracks. Remove anything that might make you trip as you walk through a door, such as a raised step or threshold. Trim any bushes or trees on the path to your home. Use bright outdoor lighting. Clear any walking paths of anything that might make someone trip, such as rocks or tools. Regularly check to see if handrails are loose or broken. Make sure that both sides of any steps have handrails. Any raised decks and porches should have guardrails on the edges. Have any leaves, snow, or ice cleared regularly. Use sand or salt on walking paths during winter. Clean up any spills in your garage right away. This includes oil or grease spills. What can I do in the bathroom? Use night lights. Install grab bars by the toilet and in the tub and shower. Do not use towel bars as grab bars. Use non-skid mats or decals in the tub or shower. If you need to sit down in the shower, use a plastic, non-slip stool. Keep the floor dry. Clean up any water that spills on the floor as soon as it happens. Remove soap buildup in the tub or shower regularly. Attach bath mats securely with double-sided non-slip rug tape. Do not have throw rugs and other things on the floor that can make you trip. What can I do in the bedroom? Use night lights. Make sure that you have a light by your bed that is easy to reach. Do not use  any sheets or blankets that are too big for your bed. They should not hang down onto the floor. Have a firm chair that has side arms. You can use this for support while you get dressed. Do not have throw rugs and other things on the floor that can make you trip. What can I do in the kitchen? Clean up any spills right away. Avoid walking on wet floors. Keep items that you use a lot in easy-to-reach places. If you need to reach something above you, use a strong step stool that has a grab bar. Keep electrical cords out of the way. Do not use floor polish or wax that makes floors slippery. If you must use wax, use non-skid floor wax. Do not have throw rugs and other things on the floor that can make you trip. What can I do with my stairs? Do not leave any items on the stairs. Make sure that there are handrails on both sides of the stairs and use them. Fix handrails that are broken or loose. Make sure that handrails are as long as the stairways. Check any carpeting to make sure that it is firmly attached to the stairs. Fix any carpet that is loose or worn. Avoid having throw rugs at the top or bottom of the stairs. If you do have throw rugs, attach them to  the floor with carpet tape. Make sure that you have a light switch at the top of the stairs and the bottom of the stairs. If you do not have them, ask someone to add them for you. What else can I do to help prevent falls? Wear shoes that: Do not have high heels. Have rubber bottoms. Are comfortable and fit you well. Are closed at the toe. Do not wear sandals. If you use a stepladder: Make sure that it is fully opened. Do not climb a closed stepladder. Make sure that both sides of the stepladder are locked into place. Ask someone to hold it for you, if possible. Clearly mark and make sure that you can see: Any grab bars or handrails. First and last steps. Where the edge of each step is. Use tools that help you move around (mobility aids)  if they are needed. These include: Canes. Walkers. Scooters. Crutches. Turn on the lights when you go into a dark area. Replace any light bulbs as soon as they burn out. Set up your furniture so you have a clear path. Avoid moving your furniture around. If any of your floors are uneven, fix them. If there are any pets around you, be aware of where they are. Review your medicines with your doctor. Some medicines can make you feel dizzy. This can increase your chance of falling. Ask your doctor what other things that you can do to help prevent falls. This information is not intended to replace advice given to you by your health care provider. Make sure you discuss any questions you have with your health care provider. Document Released: 11/24/2008 Document Revised: 07/06/2015 Document Reviewed: 03/04/2014 Elsevier Interactive Patient Education  2017 [employer].

## 2020-11-20 NOTE — Telephone Encounter (Signed)
Spoke to patient Dr.Jordan's advice given.She will use up her 10 mg tablets and take 1/2 tablet twice a week Mon & Fri.She will send a email back if she can tolerate and I will send in a 5 mg tablet.

## 2020-11-20 NOTE — Telephone Encounter (Signed)
I would like to try her on very low dose Crestor 5 mg twice a week and see if she can tolerate this.   Shaddai Shapley Martinique MD, [facility]

## 2020-12-11 ENCOUNTER — Other Ambulatory Visit: Payer: Self-pay | Admitting: Family Medicine

## 2020-12-27 ENCOUNTER — Other Ambulatory Visit: Payer: Self-pay | Admitting: Family Medicine

## 2020-12-27 ENCOUNTER — Other Ambulatory Visit: Payer: Self-pay | Admitting: Cardiology

## 2020-12-27 DIAGNOSIS — I10 Essential (primary) hypertension: Secondary | ICD-10-CM

## 2020-12-27 DIAGNOSIS — M109 Gout, unspecified: Secondary | ICD-10-CM

## 2021-01-20 ENCOUNTER — Other Ambulatory Visit: Payer: Self-pay | Admitting: Internal Medicine

## 2021-01-22 ENCOUNTER — Encounter: Payer: Self-pay | Admitting: Family Medicine

## 2021-01-23 MED ORDER — NIRMATRELVIR/RITONAVIR (PAXLOVID)TABLET: 3.0000 | ORAL_TABLET | Freq: Two times a day (BID) | ORAL | 0 refills | Status: AC

## 2021-01-23 NOTE — Telephone Encounter (Signed)
I sent in Paxlovid for her

## 2021-02-20 NOTE — Progress Notes (Signed)
Jennifer Green Date of Birth: 10/24/1940 Medical Record #[id number]  History of Present Illness: Jennifer Green is seen today for followup of PVCs. Extensive evaluation in October 2012 including echocardiogram and stress Myoview study were normal. She did have coronary calcification noted on chest CT in 2/13. No history of angina.   She states she is doing very well from a cardiac standpoint. She has rare palpitations. BP is well controlled. No chest pain or dyspnea. No syncope. She did have myalgias when taking Crestor daily. Now tolerating taking it twice a week   Current Outpatient Medications on File Prior to Visit  Medication Sig Dispense Refill   allopurinol (ZYLOPRIM) 300 MG tablet TAKE ONE TABLET BY MOUTH ONE TIME DAILY 90 tablet 0   amLODipine (NORVASC) 10 MG tablet TAKE ONE TABLET BY MOUTH ONE TIME DAILY 90 tablet 3   Ascorbic Acid (VITAMIN C) 1000 MG tablet Take 1,000 mg by mouth daily.     atenolol (TENORMIN) 25 MG tablet TAKE ONE TABLET BY MOUTH ONE TIME DAILY 90 tablet 0   Calcium Carb-Cholecalciferol (CALCIUM 600+D3 PO) Take 1 tablet by mouth daily.     diclofenac (VOLTAREN) 75 MG EC tablet Take 1 tablet (75 mg total) by mouth 2 (two) times daily. 180 tablet 3   dorzolamide-timolol (COSOPT) 22.3-6.8 MG/ML ophthalmic solution Place 1 drop into both eyes 2 (two) times daily.     hydrochlorothiazide (HYDRODIURIL) 25 MG tablet TAKE ONE TABLET BY MOUTH ONE TIME DAILY 90 tablet 0   levothyroxine (SYNTHROID) 50 MCG tablet TAKE ONE TABLET BY MOUTH ONE TIME DAILY 90 tablet 3   Magnesium 400 MG CAPS Take 400 mg by mouth every other day. In the morning     Netarsudil-Latanoprost (ROCKLATAN) 0.02-0.005 % SOLN Place 1 drop into both eyes nightly.     omeprazole (PRILOSEC) 40 MG capsule TAKE ONE CAPSULE BY MOUTH ONE TIME DAILY 90 capsule 0   pilocarpine (PILOCAR) 4 % ophthalmic solution Place 1 drop into both eyes 2 (two) times daily.     rosuvastatin (CRESTOR) 10 MG tablet TAKE ONE  TABLET BY MOUTH ONE TIME DAILY (Patient taking differently: Take 5 mg by mouth 2 (two) times a week. TAKE ONE TABLET BY MOUTH ONE TIME DAILY) 90 tablet 3   temazepam (RESTORIL) 30 MG capsule TAKE ONE CAPSULE BY MOUTH AT BEDTIME AS NEEDED FOR SLEEP 30 capsule 5   No current facility-administered medications on file prior to visit.    Allergies  Allergen Reactions   Indomethacin     Rapid Heart Beat   Promethazine Hcl Other (See Comments)    Couldn't stay still    Past Medical History:  Diagnosis Date   Allergy    Arthritis    Dyspnea    Elevated blood sugar    790.21   GERD (gastroesophageal reflux disease)    Glaucoma    Gout    Hypertension    Hypokalemia    Hypomagnesemia    Insomnia    Murmur    OP (osteoporosis)    PVC (premature ventricular contraction)     Past Surgical History:  Procedure Laterality Date   ANTERIOR CERVICAL DECOMP/DISCECTOMY FUSION N/A 04/28/2017   Procedure: ANTERIOR CERVICAL DECOMPRESSION/DISCECTOMY FUSION, INTERBODY PROSTHESIS CERVICAL SIX- CERVICAL SEVEN, EXPLORE FUSION, REMOVAL OF OLD SKYLINE PLATE CERVICAL FIVE- CERVICAL SIX;  Surgeon: Newman Pies, MD;  Location: [facility];  Service: Neurosurgery;  Laterality: N/A;   BACK SURGERY     CERVICAL FUSION  05/2010   per  Dr. Newman Pies    EYE SURGERY     right and left    Social History   Tobacco Use  Smoking Status Never  Smokeless Tobacco Never    Social History   Substance and Sexual Activity  Alcohol Use No    Family History  Problem Relation Age of Onset   Pneumonia Mother    Hypertension Mother    Breast cancer Mother    Heart disease Father     Review of Systems: As noted in history of present illness..  All other systems were reviewed and are negative.  Physical Exam: BP 122/66    Pulse 61    Ht 5' (1.524 m)    Wt 119 lb 6.4 oz (54.2 kg)    SpO2 100%    BMI 23.32 kg/m   GENERAL:  Well appearing WF in NAD HEENT:  PERRL, EOMI, sclera are clear. Oropharynx is  clear. NECK:  No jugular venous distention, carotid upstroke brisk and symmetric, no bruits, no thyromegaly or adenopathy LUNGS:  Clear to auscultation bilaterally CHEST:  Unremarkable HEART:  RRR,  PMI not displaced or sustained,S1 and S2 within normal limits, no S3, no S4: no clicks, no rubs, no murmurs ABD:  Soft, nontender. BS +, no masses or bruits. No hepatomegaly, no splenomegaly EXT:  2 + pulses throughout, no edema, no cyanosis no clubbing SKIN:  Warm and dry.  No rashes NEURO:  Alert and oriented x 3. Cranial nerves II through XII intact. PSYCH:  Cognitively intact    LABORATORY DATA:   Lab Results  Component Value Date   WBC 7.0 05/01/2020   HGB 13.7 05/01/2020   HCT 39.6 05/01/2020   PLT 255.0 05/01/2020   GLUCOSE 118 (H) 05/01/2020   CHOL 111 05/01/2020   TRIG 122.0 05/01/2020   HDL 42.60 05/01/2020   LDLDIRECT 121.0 04/08/2019   LDLCALC 44 05/01/2020   ALT 13 05/01/2020   AST 17 05/01/2020   NA 139 05/01/2020   K 4.2 05/01/2020   CL 99 05/01/2020   CREATININE 0.88 05/01/2020   BUN 21 05/01/2020   CO2 29 05/01/2020   TSH 1.07 05/01/2020   HGBA1C 6.0 05/01/2020   Ecg today shows NSR rate 62. Normal.   I have personally reviewed and interpreted this study.   Assessment / Plan: 1. PVCs/PACs chronic and benign based on prior cardiac work up. Continue beta blocker therapy.   2. Coronary calcification with normal myoview study 2012. No symptoms of angina. Recommend risk factor modification. On Crestor 10 mg daily with reduction in LDL from 121 to 44. Dose now reduced due to myalgias. Now only taking twice a week. Will have follow up lab work with Dr Sarajane Jews.  3. HTN now well controlled   Follow up in one year.

## 2021-03-06 ENCOUNTER — Ambulatory Visit (INDEPENDENT_AMBULATORY_CARE_PROVIDER_SITE_OTHER): Payer: Medicare Other | Admitting: Cardiology

## 2021-03-06 ENCOUNTER — Encounter: Payer: Self-pay | Admitting: Cardiology

## 2021-03-06 ENCOUNTER — Other Ambulatory Visit: Payer: Self-pay

## 2021-03-06 VITALS — BP 122/66 | HR 61 | Ht 60.0 in | Wt 119.4 lb

## 2021-03-06 DIAGNOSIS — I2510 Atherosclerotic heart disease of native coronary artery without angina pectoris: Secondary | ICD-10-CM | POA: Diagnosis not present

## 2021-03-06 DIAGNOSIS — E7800 Pure hypercholesterolemia, unspecified: Secondary | ICD-10-CM

## 2021-03-06 DIAGNOSIS — I10 Essential (primary) hypertension: Secondary | ICD-10-CM | POA: Diagnosis not present

## 2021-03-06 DIAGNOSIS — I493 Ventricular premature depolarization: Secondary | ICD-10-CM

## 2021-03-11 ENCOUNTER — Other Ambulatory Visit: Payer: Self-pay | Admitting: Family Medicine

## 2021-03-21 ENCOUNTER — Encounter: Payer: Self-pay | Admitting: Family Medicine

## 2021-03-21 ENCOUNTER — Ambulatory Visit (INDEPENDENT_AMBULATORY_CARE_PROVIDER_SITE_OTHER): Payer: Medicare Other | Admitting: Family Medicine

## 2021-03-21 VITALS — BP 110/68 | HR 58 | Temp 98.1°F | Wt 117.1 lb

## 2021-03-21 DIAGNOSIS — E039 Hypothyroidism, unspecified: Secondary | ICD-10-CM

## 2021-03-21 DIAGNOSIS — R739 Hyperglycemia, unspecified: Secondary | ICD-10-CM

## 2021-03-21 DIAGNOSIS — R609 Edema, unspecified: Secondary | ICD-10-CM | POA: Diagnosis not present

## 2021-03-21 DIAGNOSIS — K219 Gastro-esophageal reflux disease without esophagitis: Secondary | ICD-10-CM | POA: Diagnosis not present

## 2021-03-21 DIAGNOSIS — E538 Deficiency of other specified B group vitamins: Secondary | ICD-10-CM

## 2021-03-21 DIAGNOSIS — I2510 Atherosclerotic heart disease of native coronary artery without angina pectoris: Secondary | ICD-10-CM | POA: Diagnosis not present

## 2021-03-21 DIAGNOSIS — I10 Essential (primary) hypertension: Secondary | ICD-10-CM | POA: Diagnosis not present

## 2021-03-21 DIAGNOSIS — E785 Hyperlipidemia, unspecified: Secondary | ICD-10-CM

## 2021-03-21 LAB — TSH: TSH: 0.8 u[IU]/mL (ref 0.35–5.50)

## 2021-03-21 LAB — BASIC METABOLIC PANEL
BUN: 16 mg/dL (ref 6–23)
CO2: 30 mEq/L (ref 19–32)
Calcium: 9.9 mg/dL (ref 8.4–10.5)
Chloride: 92 mEq/L — ABNORMAL LOW (ref 96–112)
Creatinine, Ser: 0.93 mg/dL (ref 0.40–1.20)
GFR: 57.06 mL/min — ABNORMAL LOW (ref >60.00)
Glucose, Bld: 101 mg/dL — ABNORMAL HIGH (ref 70–99)
Potassium: 3.8 mEq/L (ref 3.5–5.1)
Sodium: 132 mEq/L — ABNORMAL LOW (ref 135–145)

## 2021-03-21 LAB — CBC WITH DIFFERENTIAL/PLATELET
Basophils Absolute: 0 10*3/uL (ref 0.0–0.1)
Basophils Relative: 0.6 % (ref 0.0–3.0)
Eosinophils Absolute: 0.1 10*3/uL (ref 0.0–0.7)
Eosinophils Relative: 1.7 % (ref 0.0–5.0)
HCT: 41.5 % (ref 36.0–46.0)
Hemoglobin: 13.9 g/dL (ref 12.0–15.0)
Lymphocytes Relative: 36.5 % (ref 12.0–46.0)
Lymphs Abs: 2.5 10*3/uL (ref 0.7–4.0)
MCHC: 33.5 g/dL (ref 30.0–36.0)
MCV: 95.4 fl (ref 78.0–100.0)
Monocytes Absolute: 0.6 10*3/uL (ref 0.1–1.0)
Monocytes Relative: 8.5 % (ref 3.0–12.0)
Neutro Abs: 3.6 10*3/uL (ref 1.4–7.7)
Neutrophils Relative %: 52.7 % (ref 43.0–77.0)
Platelets: 348 10*3/uL (ref 150.0–400.0)
RBC: 4.35 Mil/uL (ref 3.87–5.11)
RDW: 14.7 % (ref 11.5–15.5)
WBC: 6.8 10*3/uL (ref 4.0–10.5)

## 2021-03-21 LAB — T4, FREE: Free T4: 1.08 ng/dL (ref 0.60–1.60)

## 2021-03-21 LAB — HEMOGLOBIN A1C: Hgb A1c MFr Bld: 6.1 % (ref 4.6–6.5)

## 2021-03-21 LAB — T3, FREE: T3, Free: 3.5 pg/mL (ref 2.3–4.2)

## 2021-03-21 LAB — VITAMIN B12: Vitamin B-12: >1504 pg/mL — ABNORMAL HIGH (ref 211–911)

## 2021-03-21 NOTE — Progress Notes (Signed)
Subjective:    Patient ID: Jennifer Green, female    DOB: 08-Mar-1940, 81 y.o.   MRN: [id number]  HPI Here to follow up on issues. Her only complain today is generalized fatigue ,no SOB or chest pain. Her appetite and sleep are intact. She has been under stress lately since she placed her husband in a memory care facility a few weeks ago. Her gout and GERD are stable. She saw Dr. Martinique on 03-06-21, and he felt she was doing well from a cardiac perspective.    Review of Systems  Constitutional:  Positive for fatigue.  HENT: Negative.    Eyes: Negative.   Respiratory: Negative.    Cardiovascular: Negative.   Gastrointestinal: Negative.   Genitourinary:  Negative for decreased urine volume, difficulty urinating, dyspareunia, dysuria, enuresis, flank pain, frequency, hematuria, pelvic pain and urgency.  Musculoskeletal: Negative.   Skin: Negative.   Neurological: Negative.  Negative for headaches.  Psychiatric/Behavioral: Negative.        Objective:   Physical Exam Constitutional:      General: She is not in acute distress.    Appearance: Normal appearance. She is well-developed.  HENT:     Head: Normocephalic and atraumatic.     Right Ear: External ear normal.     Left Ear: External ear normal.     Nose: Nose normal.     Mouth/Throat:     Pharynx: No oropharyngeal exudate.  Eyes:     General: No scleral icterus.    Conjunctiva/sclera: Conjunctivae normal.     Pupils: Pupils are equal, round, and reactive to light.  Neck:     Thyroid: No thyromegaly.     Vascular: No JVD.  Cardiovascular:     Rate and Rhythm: Normal rate and regular rhythm.     Heart sounds: Normal heart sounds. No murmur heard.   No friction rub. No gallop.  Pulmonary:     Effort: Pulmonary effort is normal. No respiratory distress.     Breath sounds: Normal breath sounds. No wheezing or rales.  Chest:     Chest wall: No tenderness.  Abdominal:     General: Bowel sounds are normal. There is no  distension.     Palpations: Abdomen is soft. There is no mass.     Tenderness: There is no abdominal tenderness. There is no guarding or rebound.  Musculoskeletal:        General: No tenderness. Normal range of motion.     Cervical back: Normal range of motion and neck supple.  Lymphadenopathy:     Cervical: No cervical adenopathy.  Skin:    General: Skin is warm and dry.     Findings: No erythema or rash.  Neurological:     Mental Status: She is alert and oriented to person, place, and time.     Cranial Nerves: No cranial nerve deficit.     Motor: No abnormal muscle tone.     Coordination: Coordination normal.     Deep Tendon Reflexes: Reflexes are normal and symmetric. Reflexes normal.  Psychiatric:        Behavior: Behavior normal.        Thought Content: Thought content normal.        Judgment: Judgment normal.          Assessment & Plan:  Her HTN and GERD and gout and OA are stable. We will get labs to check her thryoid levels and for possible sources for her fatigue. We spent a total of (  34  ) minutes reviewing records and discussing these issues.  Alysia Penna, MD

## 2021-03-27 ENCOUNTER — Other Ambulatory Visit: Payer: Self-pay | Admitting: Family Medicine

## 2021-03-27 DIAGNOSIS — I10 Essential (primary) hypertension: Secondary | ICD-10-CM

## 2021-03-27 DIAGNOSIS — M109 Gout, unspecified: Secondary | ICD-10-CM

## 2021-05-07 ENCOUNTER — Other Ambulatory Visit: Payer: Self-pay

## 2021-05-07 ENCOUNTER — Ambulatory Visit
Admission: RE | Admit: 2021-05-07 | Discharge: 2021-05-07 | Disposition: A | Discharge status: Home / Self Care | Payer: Medicare Other | Source: Ambulatory Visit | Attending: Family Medicine | Admitting: Family Medicine

## 2021-05-07 DIAGNOSIS — Z780 Asymptomatic menopausal state: Secondary | ICD-10-CM

## 2021-05-23 ENCOUNTER — Other Ambulatory Visit: Payer: Self-pay | Admitting: Internal Medicine

## 2021-05-28 NOTE — Telephone Encounter (Signed)
Pt LOV was on 03/21/2021 ?Last refill done on 01/22/2021 ?Please advise ?

## 2021-06-09 ENCOUNTER — Other Ambulatory Visit: Payer: Self-pay | Admitting: Family Medicine

## 2021-06-25 ENCOUNTER — Other Ambulatory Visit: Payer: Self-pay | Admitting: Family Medicine

## 2021-06-25 DIAGNOSIS — M109 Gout, unspecified: Secondary | ICD-10-CM

## 2021-06-25 DIAGNOSIS — I10 Essential (primary) hypertension: Secondary | ICD-10-CM

## 2021-06-26 ENCOUNTER — Ambulatory Visit (INDEPENDENT_AMBULATORY_CARE_PROVIDER_SITE_OTHER): Payer: Medicare Other | Admitting: Family Medicine

## 2021-06-26 ENCOUNTER — Encounter: Payer: Self-pay | Admitting: Family Medicine

## 2021-06-26 VITALS — BP 108/64 | HR 52 | Temp 98.4°F | Ht 60.0 in | Wt 116.0 lb

## 2021-06-26 DIAGNOSIS — R221 Localized swelling, mass and lump, neck: Secondary | ICD-10-CM

## 2021-06-26 DIAGNOSIS — R609 Edema, unspecified: Secondary | ICD-10-CM

## 2021-06-26 DIAGNOSIS — E785 Hyperlipidemia, unspecified: Secondary | ICD-10-CM

## 2021-06-26 DIAGNOSIS — K219 Gastro-esophageal reflux disease without esophagitis: Secondary | ICD-10-CM

## 2021-06-26 DIAGNOSIS — M5030 Other cervical disc degeneration, unspecified cervical region: Secondary | ICD-10-CM

## 2021-06-26 DIAGNOSIS — E039 Hypothyroidism, unspecified: Secondary | ICD-10-CM

## 2021-06-26 DIAGNOSIS — G4700 Insomnia, unspecified: Secondary | ICD-10-CM

## 2021-06-26 DIAGNOSIS — I2510 Atherosclerotic heart disease of native coronary artery without angina pectoris: Secondary | ICD-10-CM

## 2021-06-26 DIAGNOSIS — M1A9XX0 Chronic gout, unspecified, without tophus (tophi): Secondary | ICD-10-CM

## 2021-06-26 DIAGNOSIS — I10 Essential (primary) hypertension: Secondary | ICD-10-CM

## 2021-06-26 LAB — HEPATIC FUNCTION PANEL
ALT: 18 U/L (ref 0–35)
AST: 21 U/L (ref 0–37)
Albumin: 4.4 g/dL (ref 3.5–5.2)
Alkaline Phosphatase: 65 U/L (ref 39–117)
Bilirubin, Direct: 0.1 mg/dL (ref 0.0–0.3)
Total Bilirubin: 0.5 mg/dL (ref 0.2–1.2)
Total Protein: 7.1 g/dL (ref 6.0–8.3)

## 2021-06-26 LAB — BASIC METABOLIC PANEL
BUN: 20 mg/dL (ref 6–23)
CO2: 29 mEq/L (ref 19–32)
Calcium: 9.7 mg/dL (ref 8.4–10.5)
Chloride: 85 mEq/L — ABNORMAL LOW (ref 96–112)
Creatinine, Ser: 0.82 mg/dL (ref 0.40–1.20)
GFR: 66.24 mL/min (ref >60.00)
Glucose, Bld: 104 mg/dL — ABNORMAL HIGH (ref 70–99)
Potassium: 4.3 mEq/L (ref 3.5–5.1)
Sodium: 123 mEq/L — ABNORMAL LOW (ref 135–145)

## 2021-06-26 LAB — LIPID PANEL
Cholesterol: 187 mg/dL (ref 0–200)
HDL: 58.7 mg/dL (ref >39.00)
LDL Cholesterol: 107 mg/dL — ABNORMAL HIGH (ref 0–99)
NonHDL: 128.32
Total CHOL/HDL Ratio: 3
Triglycerides: 108 mg/dL (ref 0.0–149.0)
VLDL: 21.6 mg/dL (ref 0.0–40.0)

## 2021-06-26 LAB — URIC ACID: Uric Acid, Serum: 2.2 mg/dL — ABNORMAL LOW (ref 2.4–7.0)

## 2021-06-26 MED ORDER — TRAZODONE HCL 100 MG PO TABS: 100.0000 mg | ORAL_TABLET | Freq: Every day | ORAL | 5 refills | Status: DC

## 2021-06-26 MED ORDER — ROSUVASTATIN CALCIUM 10 MG PO TABS: ORAL_TABLET | ORAL | 3 refills | Status: DC

## 2021-06-26 NOTE — Progress Notes (Addendum)
? ?Subjective:  ? ? Patient ID: Jennifer Green, female    DOB: 1940/08/16, 81 y.o.   MRN: [id number] ? ?HPI ?Here to follow up on issues. She has been seeing Dr. Veverly Fells for orthopedic concerns and he recently got plain films of her neck. She is S/P two neck surgeries for discectomy and fusion, and she has a plate at the level of C6-7. Dr. Veverly Fells told her it looked like her trachea was putting pressure on her esophagus, so asked [place] to investigate. She denies any pain in the neck, no trouble swallowing or breathing or speaking. She recently had glaucoma surgery on both eyes, and this was successful. Her pressures have come down from 28 to11. She has not had a gout attack for years now. Her BP is stable. She has had trouble sleeping lately. Temazepam worked well for her for  several years, but it no longer works as well. She falls asleep easily , but she wakes up a few hours later.  ? ? ?Review of Systems  ?Constitutional: Negative.   ?HENT: Negative.    ?Eyes: Negative.   ?Respiratory: Negative.    ?Cardiovascular: Negative.   ?Gastrointestinal: Negative.   ?Genitourinary:  Negative for decreased urine volume, difficulty urinating, dyspareunia, dysuria, enuresis, flank pain, frequency, hematuria, pelvic pain and urgency.  ?Musculoskeletal: Negative.   ?Skin: Negative.   ?Neurological: Negative.  Negative for headaches.  ?Psychiatric/Behavioral:  Positive for sleep disturbance.   ? ?   ?Objective:  ? Physical Exam ?Constitutional:   ?   General: She is not in acute distress. ?   Appearance: Normal appearance. She is well-developed.  ?HENT:  ?   Head: Normocephalic and atraumatic.  ?   Right Ear: External ear normal.  ?   Left Ear: External ear normal.  ?   Nose: Nose normal.  ?   Mouth/Throat:  ?   Pharynx: No oropharyngeal exudate.  ?Eyes:  ?   General: No scleral icterus. ?   Conjunctiva/sclera: Conjunctivae normal.  ?   Pupils: Pupils are equal, round, and reactive to light.  ?Neck:  ?   Thyroid: No thyromegaly.  ?    Vascular: No JVD.  ?Cardiovascular:  ?   Rate and Rhythm: Normal rate and regular rhythm.  ?   Heart sounds: Normal heart sounds. No murmur heard. ?  No friction rub. No gallop.  ?Pulmonary:  ?   Effort: Pulmonary effort is normal. No respiratory distress.  ?   Breath sounds: Normal breath sounds. No wheezing or rales.  ?Chest:  ?   Chest wall: No tenderness.  ?Abdominal:  ?   General: Bowel sounds are normal. There is no distension.  ?   Palpations: Abdomen is soft. There is no mass.  ?   Tenderness: There is no abdominal tenderness. There is no guarding or rebound.  ?Musculoskeletal:     ?   General: No tenderness. Normal range of motion.  ?   Cervical back: Normal range of motion and neck supple. No rigidity.  ?Lymphadenopathy:  ?   Cervical: No cervical adenopathy.  ?Skin: ?   General: Skin is warm and dry.  ?   Findings: No erythema or rash.  ?Neurological:  ?   Mental Status: She is alert and oriented to person, place, and time.  ?   Cranial Nerves: No cranial nerve deficit.  ?   Motor: No abnormal muscle tone.  ?   Coordination: Coordination normal.  ?   Deep Tendon Reflexes: Reflexes are  normal and symmetric. Reflexes normal.  ?Psychiatric:     ?   Behavior: Behavior normal.     ?   Thought Content: Thought content normal.     ?   Judgment: Judgment normal.  ? ? ? ? ? ?   ?Assessment & Plan:  ?Her HTN and CAD are stable. Her glaucoma is now well controlled. Since there is a question of a problem with her trachea and esophagus, we will set up a CT scan of her neck. Her gout is stable. We will get fasting labs to check lipids, etc. For the insomnia, she will stop Temazepam and try Trazodone 100 mg at bedtime.  ? We spent a total of ( 32  ) minutes reviewing records and discussing these issues.  ?Alysia Penna, MD ? ? ?

## 2021-06-26 NOTE — Addendum Note (Signed)
Addended by: Alysia Penna A on: 06/26/2021 01:45 PM ? ? Modules accepted: Orders ? ?

## 2021-06-30 ENCOUNTER — Encounter: Payer: Self-pay | Admitting: Family Medicine

## 2021-07-03 NOTE — Telephone Encounter (Signed)
There are no side effects to worry about. Hopefully it will be more effective

## 2021-07-17 ENCOUNTER — Encounter: Payer: Self-pay | Admitting: Family Medicine

## 2021-07-26 ENCOUNTER — Telehealth: Payer: Self-pay | Admitting: Family Medicine

## 2021-07-26 NOTE — Telephone Encounter (Signed)
Patient is having "to look at things more than once because she is seeing things" since Saturday per medication change. Patient is taking traZODone (DESYREL) 100 MG tablet. Having hallucinations Monday night, getting better but not gone today       Refused to call 911 but concern is patient living alone     Does not want to leave dog, wants to be seen tomorrow by pcp.    Please advise

## 2021-07-27 NOTE — Telephone Encounter (Signed)
Spoke with patient, informed of message.    Voiced understanding.

## 2021-07-27 NOTE — Telephone Encounter (Signed)
Tell her to not take any Trazodone tonight, then try taking 1/2 a pill (50 mg) at bedtime after that

## 2021-07-30 ENCOUNTER — Ambulatory Visit (INDEPENDENT_AMBULATORY_CARE_PROVIDER_SITE_OTHER): Payer: Medicare Other | Admitting: Family Medicine

## 2021-07-30 ENCOUNTER — Encounter: Payer: Self-pay | Admitting: Family Medicine

## 2021-07-30 VITALS — BP 110/68 | HR 52 | Temp 98.5°F | Wt 115.4 lb

## 2021-07-30 DIAGNOSIS — I2510 Atherosclerotic heart disease of native coronary artery without angina pectoris: Secondary | ICD-10-CM | POA: Diagnosis not present

## 2021-07-30 DIAGNOSIS — G4700 Insomnia, unspecified: Secondary | ICD-10-CM | POA: Diagnosis not present

## 2021-07-30 MED ORDER — LORAZEPAM 0.5 MG PO TABS: 0.5000 mg | ORAL_TABLET | Freq: Every day | ORAL | 1 refills | Status: DC

## 2021-07-30 NOTE — Progress Notes (Signed)
   Subjective:    Patient ID: Jennifer Green, female    DOB: [birth year], 81 y.o.   MRN: [id number]  HPI Here to follow up on insomnia. At our last visit we agreed that she would stop Temazepam since it had stopped working for her, and she tried Trazodone. The first night she never fell asleep at all. The second night she had hallucinations (seeing things lying on the floor). She has continued to take it at 1/2 a pill at bedtime and the hallucinations stopped, however she still cannot sleep much. Today she says her main problem is relaxing and falling asleep initially. After that she does not wake up very often.    Review of Systems  Constitutional: Negative.   Respiratory: Negative.    Cardiovascular: Negative.   Psychiatric/Behavioral:  Positive for sleep disturbance. Negative for dysphoric mood. The patient is nervous/anxious.        Objective:   Physical Exam Constitutional:      Appearance: Normal appearance.  Cardiovascular:     Rate and Rhythm: Normal rate and regular rhythm.     Pulses: Normal pulses.     Heart sounds: Normal heart sounds.  Pulmonary:     Effort: Pulmonary effort is normal.     Breath sounds: Normal breath sounds.  Neurological:     General: No focal deficit present.     Mental Status: She is alert and oriented to person, place, and time.  Psychiatric:        Mood and Affect: Mood normal.        Behavior: Behavior normal.        Thought Content: Thought content normal.          Assessment & Plan:  Insomnia. We will stop the Trazodone. Instead she will try Lorazepam at bedtime. She will report back in 2 weeks.  Alysia Penna, MD

## 2021-08-08 ENCOUNTER — Ambulatory Visit
Admission: RE | Admit: 2021-08-08 | Discharge: 2021-08-08 | Disposition: A | Discharge status: Home / Self Care | Payer: Medicare Other | Source: Ambulatory Visit | Attending: Family Medicine | Admitting: Family Medicine

## 2021-08-08 DIAGNOSIS — R221 Localized swelling, mass and lump, neck: Secondary | ICD-10-CM

## 2021-08-08 MED ORDER — IOPAMIDOL (ISOVUE-300) INJECTION 61%
75.0000 mL | Freq: Once | INTRAVENOUS | Status: AC | PRN
  Administered 2021-08-08: 75 mL via INTRAVENOUS

## 2021-08-15 ENCOUNTER — Encounter: Payer: Self-pay | Admitting: Family Medicine

## 2021-08-15 ENCOUNTER — Ambulatory Visit (INDEPENDENT_AMBULATORY_CARE_PROVIDER_SITE_OTHER): Payer: Medicare Other | Admitting: Family Medicine

## 2021-08-15 VITALS — BP 110/62 | HR 52 | Temp 98.7°F | Wt 117.5 lb

## 2021-08-15 DIAGNOSIS — I2510 Atherosclerotic heart disease of native coronary artery without angina pectoris: Secondary | ICD-10-CM | POA: Diagnosis not present

## 2021-08-15 DIAGNOSIS — G4700 Insomnia, unspecified: Secondary | ICD-10-CM

## 2021-08-15 MED ORDER — LORAZEPAM 1 MG PO TABS: 1.0000 mg | ORAL_TABLET | Freq: Every day | ORAL | 1 refills | Status: DC

## 2021-08-15 NOTE — Progress Notes (Signed)
Subjective:    Patient ID: Jennifer Green, female    DOB: [birth year], 81 y.o.   MRN: [id number]  HPI Here to follow up on insomnia. We met several weeks ago, and she was given some 0.5 mg Lorazepam to try at bedtime. This did not help much, so she tried taking 2 at a time. This has worked very well, and she has been able to sleep 6-8 hours every night. She asks to stay on this dosage.    Review of Systems  Constitutional: Negative.   Respiratory: Negative.    Cardiovascular: Negative.        Objective:   Physical Exam Constitutional:      Appearance: Normal appearance.  Cardiovascular:     Rate and Rhythm: Normal rate and regular rhythm.     Pulses: Normal pulses.     Heart sounds: Normal heart sounds.  Pulmonary:     Effort: Pulmonary effort is normal.     Breath sounds: Normal breath sounds.  Neurological:     Mental Status: She is alert.           Assessment & Plan:  Insomnia, we will increase the Lorazepam to a full 1 mg at bedtime. Recheck as needed.  Alysia Penna, MD

## 2021-09-02 ENCOUNTER — Encounter: Payer: Self-pay | Admitting: Family Medicine

## 2021-09-04 MED ORDER — LORAZEPAM 2 MG PO TABS
2.0000 mg | ORAL_TABLET | Freq: Every day | ORAL | 2 refills | Status: DC
Start: 2021-09-04 — End: 2021-12-10

## 2021-09-04 NOTE — Telephone Encounter (Signed)
I sent in the '2mg'$  dose

## 2021-09-07 ENCOUNTER — Other Ambulatory Visit: Payer: Self-pay | Admitting: Family Medicine

## 2021-09-07 ENCOUNTER — Other Ambulatory Visit: Payer: Self-pay

## 2021-09-07 DIAGNOSIS — K219 Gastro-esophageal reflux disease without esophagitis: Secondary | ICD-10-CM

## 2021-09-07 MED ORDER — OMEPRAZOLE 40 MG PO CPDR: 40.0000 mg | DELAYED_RELEASE_CAPSULE | Freq: Every day | ORAL | 1 refills | Status: DC

## 2021-09-23 ENCOUNTER — Other Ambulatory Visit: Payer: Self-pay | Admitting: Family Medicine

## 2021-09-23 DIAGNOSIS — I10 Essential (primary) hypertension: Secondary | ICD-10-CM

## 2021-09-23 DIAGNOSIS — M109 Gout, unspecified: Secondary | ICD-10-CM

## 2021-10-02 ENCOUNTER — Ambulatory Visit (INDEPENDENT_AMBULATORY_CARE_PROVIDER_SITE_OTHER): Payer: Medicare Other | Admitting: Family Medicine

## 2021-10-02 ENCOUNTER — Encounter: Payer: Self-pay | Admitting: Family Medicine

## 2021-10-02 VITALS — BP 118/68 | HR 55 | Temp 98.5°F | Wt 121.5 lb

## 2021-10-02 DIAGNOSIS — N3000 Acute cystitis without hematuria: Secondary | ICD-10-CM

## 2021-10-02 DIAGNOSIS — I2510 Atherosclerotic heart disease of native coronary artery without angina pectoris: Secondary | ICD-10-CM | POA: Diagnosis not present

## 2021-10-02 LAB — POC URINALSYSI DIPSTICK (AUTOMATED)
Bilirubin, UA: NEGATIVE
Blood, UA: NEGATIVE
Glucose, UA: NEGATIVE
Ketones, UA: NEGATIVE
Leukocytes, UA: NEGATIVE
Nitrite, UA: NEGATIVE
Protein, UA: POSITIVE — AB
Spec Grav, UA: 1.02 (ref 1.010–1.025)
Urobilinogen, UA: 0.2 E.U./dL
pH, UA: 6 (ref 5.0–8.0)

## 2021-10-02 MED ORDER — CIPROFLOXACIN HCL 500 MG PO TABS: 500.0000 mg | ORAL_TABLET | Freq: Two times a day (BID) | ORAL | 0 refills | Status: DC

## 2021-10-02 NOTE — Addendum Note (Signed)
Addended by: Wyvonne Lenz on: 10/02/2021 03:40 PM   Modules accepted: Orders

## 2021-10-02 NOTE — Addendum Note (Signed)
Addended by: Rosalyn Gess D on: 10/02/2021 03:50 PM   Modules accepted: Orders

## 2021-10-02 NOTE — Progress Notes (Signed)
   Subjective:    Patient ID: Jennifer Green, female    DOB: Oct 15, 1940, 81 y.o.   MRN: [id number]  HPI Here for 2 weeks of increased frequency and urgency to urinate. She has some burning and some lower abdominal cramps. No nausea or fever.    Review of Systems  Constitutional: Negative.   Respiratory: Negative.    Cardiovascular: Negative.   Gastrointestinal: Negative.   Genitourinary:  Positive for dysuria, frequency, pelvic pain and urgency. Negative for flank pain and hematuria.       Objective:   Physical Exam Constitutional:      General: She is not in acute distress.    Appearance: Normal appearance.  Cardiovascular:     Rate and Rhythm: Normal rate and regular rhythm.     Pulses: Normal pulses.     Heart sounds: Normal heart sounds.  Pulmonary:     Effort: Pulmonary effort is normal.     Breath sounds: Normal breath sounds.  Abdominal:     Tenderness: There is no right CVA tenderness or left CVA tenderness.  Neurological:     Mental Status: She is alert.           Assessment & Plan:  UTI, treat with 7 days of Cipro. Culture the sample.  Alysia Penna, MD

## 2021-10-03 LAB — URINE CULTURE
MICRO NUMBER:: 13814258
SPECIMEN QUALITY:: ADEQUATE

## 2021-10-08 ENCOUNTER — Other Ambulatory Visit: Payer: Self-pay | Admitting: *Deleted

## 2021-10-08 ENCOUNTER — Encounter: Payer: Self-pay | Admitting: *Deleted

## 2021-10-08 NOTE — Patient Outreach (Signed)
  Care Coordination   Initial Visit Note   10/08/2021 Name: Jennifer Green MRN: [id number] DOB: 06-19-1940  Jennifer Green is a 81 y.o. year old female who sees Jennifer Morale, MD for primary care. I spoke with  Jennifer Green by phone today.  What matters to the patients health and wellness today?  No needs    Goals Addressed               This Visit's Progress     No needs (pt-stated)        Care Coordination Interventions: Reviewed medications with patient and discussed Purpose of medications Reviewed scheduled/upcoming provider appointments including all pending appointments and encouraged pt to contact provider's office to scheduled an AWV (receptive) Screening for signs and symptoms of depression related to chronic disease state  Assessed social determinant of health barriers         SDOH assessments and interventions completed:  Yes  SDOH Interventions Today    Flowsheet Row Most Recent Value  SDOH Interventions   Food Insecurity Interventions Intervention Not Indicated  Housing Interventions Intervention Not Indicated  Transportation Interventions Intervention Not Indicated        Care Coordination Interventions Activated:  Yes  Care Coordination Interventions:  Yes, provided   Follow up plan: No further intervention required.   Encounter Outcome:  Pt. Visit Completed   Jennifer Mina, RN Care Management Coordinator [place] Office [phone]

## 2021-10-08 NOTE — Patient Instructions (Signed)
Visit Information  Thank you for taking time to visit with me today. Please don't hesitate to contact me if I can be of assistance to you.   Following are the goals we discussed today:   Goals Addressed               This Visit's Progress     No needs (pt-stated)        Care Coordination Interventions: Reviewed medications with patient and discussed Purpose of medications Reviewed scheduled/upcoming provider appointments including all pending appointments and encouraged pt to contact provider's office to scheduled an AWV (receptive) Screening for signs and symptoms of depression related to chronic disease state  Assessed social determinant of health barriers          Please call the care guide team at [phone] if you need to cancel or reschedule your appointment.   If you are experiencing a Mental Health or [place] or need someone to talk to, please call the Suicide and Crisis Lifeline: 988 call the [place] National Suicide Prevention Lifeline: 602-700-4528 or TTY: (564)872-9911 TTY 9892725207) to talk to a trained counselor call 1-800-273-TALK (toll free, 24 hour hotline)  Patient verbalizes understanding of instructions and care plan provided today and agrees to view in [place]. Active MyChart status and patient understanding of how to access instructions and care plan via MyChart confirmed with patient.     No further follow up required: No needs  [name], RN Care Management Coordinator [place] Office [phone]

## 2021-11-09 ENCOUNTER — Ambulatory Visit (INDEPENDENT_AMBULATORY_CARE_PROVIDER_SITE_OTHER): Payer: Medicare Other

## 2021-11-09 VITALS — BP 118/62 | HR 57 | Temp 98.5°F | Ht 60.0 in | Wt 122.9 lb

## 2021-11-09 DIAGNOSIS — Z0000 Encounter for general adult medical examination without abnormal findings: Secondary | ICD-10-CM

## 2021-11-09 DIAGNOSIS — Z23 Encounter for immunization: Secondary | ICD-10-CM | POA: Diagnosis not present

## 2021-11-09 NOTE — Patient Instructions (Addendum)
Ms. Kopecky , Thank you for taking time to come for your Medicare Wellness Visit. I appreciate your ongoing commitment to your health goals. Please review the following plan we discussed and let me know if I can assist you in the future.   These are the goals we discussed:  Goals       Exercise 150 minutes per week (moderate activity)      Will investigate yoga for health  Look tai chi which helps with balance;       No needs (pt-stated)      Care Coordination Interventions: Reviewed medications with patient and discussed Purpose of medications Reviewed scheduled/upcoming provider appointments including all pending appointments and encouraged pt to contact provider's office to scheduled an AWV (receptive) Screening for signs and symptoms of depression related to chronic disease state  Assessed social determinant of health barriers       patient      Will travel more; Look forward to [employer]        Stay healthy (pt-stated)      Weight (lb) < 125 lb (56.7 kg)      Will consider options;  May leave sugar off  Dairy your intake         This is a list of the screening recommended for you and due dates:  Health Maintenance  Topic Date Due   COVID-19 Vaccine (4 - Pfizer series) 07/12/2022*   Tetanus Vaccine  12/20/2024   Pneumonia Vaccine  Completed   Flu Shot  Completed   DEXA scan (bone density measurement)  Completed   Zoster (Shingles) Vaccine  Completed   HPV Vaccine  Aged Out  *Topic was postponed. The date shown is not the original due date.    Advanced directives: Please bring a copy of your health care power of attorney and living will to the office to be added to your chart at your convenience.   Conditions/risks identified: None  Next appointment: Follow up in one year for your annual wellness visit    Preventive Care 65 Years and Older, Female Preventive care refers to lifestyle choices and visits with your health care provider that can promote health and  wellness. What does preventive care include? A yearly physical exam. This is also called an annual well check. Dental exams once or twice a year. Routine eye exams. Ask your health care provider how often you should have your eyes checked. Personal lifestyle choices, including: Daily care of your teeth and gums. Regular physical activity. Eating a healthy diet. Avoiding tobacco and drug use. Limiting alcohol use. Practicing safe sex. Taking low-dose aspirin every day. Taking vitamin and mineral supplements as recommended by your health care provider. What happens during an annual well check? The services and screenings done by your health care provider during your annual well check will depend on your age, overall health, lifestyle risk factors, and family history of disease. Counseling  Your health care provider may ask you questions about your: Alcohol use. Tobacco use. Drug use. Emotional well-being. Home and relationship well-being. Sexual activity. Eating habits. History of falls. Memory and ability to understand (cognition). Work and work Statistician. Reproductive health. Screening  You may have the following tests or measurements: Height, weight, and BMI. Blood pressure. Lipid and cholesterol levels. These may be checked every 5 years, or more frequently if you are over 81 years old. Skin check. Lung cancer screening. You may have this screening every year starting at age 4 if you have  a 30-pack-year history of smoking and currently smoke or have quit within the past 15 years. Fecal occult blood test (FOBT) of the stool. You may have this test every year starting at age 64. Flexible sigmoidoscopy or colonoscopy. You may have a sigmoidoscopy every 5 years or a colonoscopy every 10 years starting at age 58. Hepatitis C blood test. Hepatitis B blood test. Sexually transmitted disease (STD) testing. Diabetes screening. This is done by checking your blood sugar (glucose)  after you have not eaten for a while (fasting). You may have this done every 1-3 years. Bone density scan. This is done to screen for osteoporosis. You may have this done starting at age 65. Mammogram. This may be done every 1-2 years. Talk to your health care provider about how often you should have regular mammograms. Talk with your health care provider about your test results, treatment options, and if necessary, the need for more tests. Vaccines  Your health care provider may recommend certain vaccines, such as: Influenza vaccine. This is recommended every year. Tetanus, diphtheria, and acellular pertussis (Tdap, Td) vaccine. You may need a Td booster every 10 years. Zoster vaccine. You may need this after age 25. Pneumococcal 13-valent conjugate (PCV13) vaccine. One dose is recommended after age 12. Pneumococcal polysaccharide (PPSV23) vaccine. One dose is recommended after age 19. Talk to your health care provider about which screenings and vaccines you need and how often you need them. This information is not intended to replace advice given to you by your health care provider. Make sure you discuss any questions you have with your health care provider. Document Released: 02/24/2015 Document Revised: 10/18/2015 Document Reviewed: 11/29/2014 Elsevier Interactive Patient Education  2017 [place] Prevention in the Home Falls can cause injuries. They can happen to people of all ages. There are many things you can do to make your home safe and to help prevent falls. What can I do on the outside of my home? Regularly fix the edges of walkways and driveways and fix any cracks. Remove anything that might make you trip as you walk through a door, such as a raised step or threshold. Trim any bushes or trees on the path to your home. Use bright outdoor lighting. Clear any walking paths of anything that might make someone trip, such as rocks or tools. Regularly check to see if  handrails are loose or broken. Make sure that both sides of any steps have handrails. Any raised decks and porches should have guardrails on the edges. Have any leaves, snow, or ice cleared regularly. Use sand or salt on walking paths during winter. Clean up any spills in your garage right away. This includes oil or grease spills. What can I do in the bathroom? Use night lights. Install grab bars by the toilet and in the tub and shower. Do not use towel bars as grab bars. Use non-skid mats or decals in the tub or shower. If you need to sit down in the shower, use a plastic, non-slip stool. Keep the floor dry. Clean up any water that spills on the floor as soon as it happens. Remove soap buildup in the tub or shower regularly. Attach bath mats securely with double-sided non-slip rug tape. Do not have throw rugs and other things on the floor that can make you trip. What can I do in the bedroom? Use night lights. Make sure that you have a light by your bed that is easy to reach. Do not use any  sheets or blankets that are too big for your bed. They should not hang down onto the floor. Have a firm chair that has side arms. You can use this for support while you get dressed. Do not have throw rugs and other things on the floor that can make you trip. What can I do in the kitchen? Clean up any spills right away. Avoid walking on wet floors. Keep items that you use a lot in easy-to-reach places. If you need to reach something above you, use a strong step stool that has a grab bar. Keep electrical cords out of the way. Do not use floor polish or wax that makes floors slippery. If you must use wax, use non-skid floor wax. Do not have throw rugs and other things on the floor that can make you trip. What can I do with my stairs? Do not leave any items on the stairs. Make sure that there are handrails on both sides of the stairs and use them. Fix handrails that are broken or loose. Make sure that  handrails are as long as the stairways. Check any carpeting to make sure that it is firmly attached to the stairs. Fix any carpet that is loose or worn. Avoid having throw rugs at the top or bottom of the stairs. If you do have throw rugs, attach them to the floor with carpet tape. Make sure that you have a light switch at the top of the stairs and the bottom of the stairs. If you do not have them, ask someone to add them for you. What else can I do to help prevent falls? Wear shoes that: Do not have high heels. Have rubber bottoms. Are comfortable and fit you well. Are closed at the toe. Do not wear sandals. If you use a stepladder: Make sure that it is fully opened. Do not climb a closed stepladder. Make sure that both sides of the stepladder are locked into place. Ask someone to hold it for you, if possible. Clearly mark and make sure that you can see: Any grab bars or handrails. First and last steps. Where the edge of each step is. Use tools that help you move around (mobility aids) if they are needed. These include: Canes. Walkers. Scooters. Crutches. Turn on the lights when you go into a dark area. Replace any light bulbs as soon as they burn out. Set up your furniture so you have a clear path. Avoid moving your furniture around. If any of your floors are uneven, fix them. If there are any pets around you, be aware of where they are. Review your medicines with your doctor. Some medicines can make you feel dizzy. This can increase your chance of falling. Ask your doctor what other things that you can do to help prevent falls. This information is not intended to replace advice given to you by your health care provider. Make sure you discuss any questions you have with your health care provider. Document Released: 11/24/2008 Document Revised: 07/06/2015 Document Reviewed: 03/04/2014 Elsevier Interactive Patient Education  2017 [employer].

## 2021-11-09 NOTE — Progress Notes (Signed)
Subjective:   Jennifer Green is a 81 y.o. female who presents for Medicare Annual (Subsequent) preventive examination.  Review of Systems     Cardiac Risk Factors include: advanced age (>30mn, >>83women);hypertension     Objective:    Today's Vitals   11/09/21 1014  BP: 118/62  Pulse: (!) 57  Temp: 98.5 F (36.9 C)  TempSrc: Oral  SpO2: 97%  Weight: 122 lb 14.4 oz (55.7 kg)  Height: 5' (1.524 m)   Body mass index is 24 kg/m.     11/09/2021   10:31 AM 11/08/2020    2:04 PM 11/08/2019    2:10 PM 04/28/2017    5:00 PM 04/22/2017   10:02 AM 01/30/2017   10:18 AM 12/11/2015    2:42 PM  Advanced Directives  Does Patient Have a Medical Advance Directive? Yes Yes Yes Yes Yes Yes Yes  Type of AParamed[place]of [place]. [place]Living will HU[place]Living will HG[place]Living will HPe[place]ierLiving will [place]Living will HCarsonLiving will   Does patient want to make changes to medical advance directive?   No - Patient declined No - Patient declined  No - Patient declined   Copy of HP[place]onin Chart? No - copy requested No - copy requested No - copy requested No - copy requested No - copy requested Yes     Current Medications (verified) Outpatient Encounter Medications as of 11/09/2021  Medication Sig   allopurinol (ZYLOPRIM) 300 MG tablet TAKE ONE TABLET BY MOUTH ONE TIME DAILY   amLODipine (NORVASC) 10 MG tablet TAKE ONE TABLET BY MOUTH ONE TIME DAILY   Ascorbic Acid (VITAMIN C) 1000 MG tablet Take 1,000 mg by mouth daily.   atenolol (TENORMIN) 25 MG tablet TAKE ONE TABLET BY MOUTH ONE TIME DAILY   Calcium Carb-Cholecalciferol (CALCIUM 600+D3 PO) Take 1 tablet by mouth daily.   ciprofloxacin (CIPRO) 500 MG tablet Take 1 tablet (500 mg total) by mouth 2 (two) times daily.   diclofenac (VOLTAREN) 75 MG EC tablet Take 1 tablet (75 mg total) by  mouth 2 (two) times daily.   dorzolamide-timolol (COSOPT) 22.3-6.8 MG/ML ophthalmic solution Place 1 drop into both eyes 2 (two) times daily.   hydrochlorothiazide (HYDRODIURIL) 25 MG tablet TAKE ONE TABLET BY MOUTH ONE TIME DAILY   levothyroxine (SYNTHROID) 50 MCG tablet TAKE ONE TABLET BY MOUTH ONE TIME DAILY   LORazepam (ATIVAN) 2 MG tablet Take 1 tablet (2 mg total) by mouth at bedtime.   Magnesium 400 MG CAPS Take 400 mg by mouth every other day. In the morning   Netarsudil-Latanoprost (ROCKLATAN) 0.02-0.005 % SOLN Place 1 drop into both eyes nightly.   omeprazole (PRILOSEC) 40 MG capsule Take 1 capsule (40 mg total) by mouth daily.   pilocarpine (PILOCAR) 4 % ophthalmic solution Place 1 drop into both eyes 2 (two) times daily.   rosuvastatin (CRESTOR) 10 MG tablet TAKE ONE TABLET once a week   No facility-administered encounter medications on file as of 11/09/2021.    Allergies (verified) Indomethacin, Promethazine hcl, and Trazodone and nefazodone   History: Past Medical History:  Diagnosis Date   Allergy    Arthritis    Dyspnea    Elevated blood sugar    790.21   GERD (gastroesophageal reflux disease)    Glaucoma    Gout    Hypertension    Hypokalemia    Hypomagnesemia    Insomnia  Murmur    OP (osteoporosis)    PVC (premature ventricular contraction)    Past Surgical History:  Procedure Laterality Date   ANTERIOR CERVICAL DECOMP/DISCECTOMY FUSION N/A 04/28/2017   Procedure: ANTERIOR CERVICAL DECOMPRESSION/DISCECTOMY FUSION, INTERBODY PROSTHESIS CERVICAL SIX- CERVICAL SEVEN, EXPLORE FUSION, REMOVAL OF OLD SKYLINE PLATE CERVICAL FIVE- CERVICAL SIX;  Surgeon: Newman Pies, MD;  Location: [facility];  Service: Neurosurgery;  Laterality: N/A;   BACK SURGERY     CERVICAL FUSION  05/2010   per Dr. Newman Pies    EYE SURGERY     right and left   Family History  Problem Relation Age of Onset   Pneumonia Mother    Hypertension Mother    Breast cancer Mother     Heart disease Father    Social History   Socioeconomic History   Marital status: Married    Spouse name: Not on file   Number of children: 2   Years of education: Not on file   Highest education level: Some college, no degree  Occupational History   Occupation: retired    Comment: Optometrist  Tobacco Use   Smoking status: Never   Smokeless tobacco: Never  Substance and Sexual Activity   Alcohol use: No   Drug use: No   Sexual activity: Never  Other Topics Concern   Not on file  Social History Narrative   Not on file   Social Determinants of Health   Financial Resource Strain: Low Risk  (11/09/2021)   Overall Financial Resource Strain (CARDIA)    Difficulty of Paying Living Expenses: Not hard at all  Food Insecurity: No Food Insecurity (11/09/2021)   Hunger Vital Sign    Worried About Running Out of Food in the Last Year: Never true    [place] in the Last Year: Never true  Transportation Needs: No Transportation Needs (11/09/2021)   PRAPARE - Hydrologist (Medical): No    Lack of Transportation (Non-Medical): No  Physical Activity: Insufficiently Active (11/09/2021)   Exercise Vital Sign    Days of Exercise per Week: 3 days    Minutes of Exercise per Session: 30 min  Stress: No Stress Concern Present (11/09/2021)   [place]    Feeling of Stress : Not at all  Social Connections: [place] (11/09/2021)   Social Connection and Isolation Panel [NHANES]    Frequency of Communication with Friends and Family: More than three times a week    Frequency of Social Gatherings with Friends and Family: More than three times a week    Attends Religious Services: More than 4 times per year    Active Member of [employer] or Organizations: Yes    Attends Music therapist: More than 4 times per year    Marital Status: Married    Tobacco Counseling Counseling given:  Not Answered   Clinical Intake:  Pre-visit preparation completed: No  Pain : No/denies pain     BMI - recorded: 24 Nutritional Status: BMI of 19-24  Normal Nutritional Risks: None Diabetes: No  How often do you need to have someone help you when you read instructions, pamphlets, or other written materials from your doctor or pharmacy?: 1 - Never  Diabetic?  No  Interpreter Needed?: No  Information entered by :: Rolene Arbour LPN   Activities of Daily Living    11/09/2021   10:29 AM  In your present state of health, do  you have any difficulty performing the following activities:  Hearing? 1  Comment Wears hearing aids  Vision? 0  Difficulty concentrating or making decisions? 0  Walking or climbing stairs? 0  Dressing or bathing? 0  Doing errands, shopping? 0  Preparing Food and eating ? N  Using the Toilet? N  In the past six months, have you accidently leaked urine? N  Do you have problems with loss of bowel control? N  Managing your Medications? N  Managing your Finances? N  Housekeeping or managing your Housekeeping? N    Patient Care Team: [name], MD as PCP - General (Family Medicine) Audery Amel Sharma Covert, [facility] (Inactive) as Pharmacist (Pharmacist)  Indicate any recent Medical Services you may have received from other than [facility] providers in the past year (date may be approximate).     Assessment:   This is a routine wellness examination for [place].  Hearing/Vision screen Hearing Screening - Comments:: Wears hearing aids Vision Screening - Comments:: Wears reading glasses - up to date with routine eye exams with  Dr Edilia Bo  Dietary issues and exercise activities discussed: Current Exercise Habits: Home exercise routine, Type of exercise: walking, Time (Minutes): 30, Frequency (Times/Week): 3, Weekly Exercise (Minutes/Week): 90, Intensity: Moderate, Exercise limited by: None identified   Goals Addressed               This Visit's Progress      Stay healthy (pt-stated)         Depression Screen    11/09/2021   10:27 AM 10/08/2021    3:41 PM 10/02/2021    2:15 PM 07/30/2021   10:25 AM 06/26/2021    1:22 PM 03/21/2021    1:45 PM 11/08/2020    2:05 PM  PHQ 2/9 Scores  PHQ - 2 Score 0 0 1 1 0 2 0  PHQ- 9 Score 0  '3 5 1 6     '$ Fall Risk    11/09/2021   10:30 AM 10/02/2021    2:14 PM 07/30/2021   10:24 AM 06/26/2021    1:22 PM 06/21/2021    9:28 AM  [place] in the past year? 0 0 0 0 1  Number falls in past yr: 0 0 0 0   Injury with Fall? 0 0 0 0   Risk for fall due to : No Fall Risks No Fall Risks No Fall Risks No Fall Risks   Follow up Falls prevention discussed Falls evaluation completed Falls evaluation completed Falls evaluation completed     FALL RISK PREVENTION PERTAINING TO THE HOME:  Any stairs in or around the home? No  If so, are there any without handrails? No  Home free of loose throw rugs in walkways, pet beds, electrical cords, etc? Yes  Adequate lighting in your home to reduce risk of falls? Yes   ASSISTIVE DEVICES UTILIZED TO PREVENT FALLS:  Life alert? No  Use of a cane, walker or w/c? No  Grab bars in the bathroom? Yes  Shower chair or bench in shower? No  Elevated toilet seat or a handicapped toilet? Yes imm  TIMED UP AND GO:  Was the test performed? Yes .  Length of time to ambulate 10 feet: 10 sec.   Gait steady and fast without use of assistive device  Cognitive Function:        11/09/2021   10:31 AM  6CIT Screen  What Year? 0 points  What month? 0 points  What time?  0 points  Count back from 20 0 points  Months in reverse 0 points  Repeat phrase 0 points  Total Score 0 points    Immunizations Immunization History  Administered Date(s) Administered   Fluad Quad(high Dose 65+) 10/28/2018, 11/09/2021   Influenza Split 12/05/2010, 11/11/2012   Influenza Whole 02/11/2005, 02/16/2009   Influenza, High Dose Seasonal PF 12/29/2013, 12/04/2016, 12/16/2017    Influenza-Unspecified 11/12/2014, 12/08/2015, 12/04/2016, 12/10/2019   PFIZER(Purple Top)SARS-COV-2 Vaccination 03/12/2019, 04/02/2019, 11/19/2019   Pneumococcal Conjugate-13 12/21/2014   Pneumococcal Polysaccharide-23 02/11/2002, 02/16/2009   Td 02/11/2002   Tdap 12/21/2014   Zoster Recombinat (Shingrix) 11/10/2020, 01/10/2021   Zoster, Live 09/09/2012    TDAP status: Up to date  Flu Vaccine status: Completed at today's visit  Pneumococcal vaccine status: Up to date  Covid-19 vaccine status: Completed vaccines  Qualifies for Shingles Vaccine? Yes   Zostavax completed Yes   Shingrix Completed?: Yes  Screening Tests Health Maintenance  Topic Date Due   COVID-19 Vaccine (4 - Pfizer series) 07/12/2022 (Originally 01/14/2020)   TETANUS/TDAP  12/20/2024   Pneumonia Vaccine 21+ Years old  Completed   INFLUENZA VACCINE  Completed   DEXA SCAN  Completed   Zoster Vaccines- Shingrix  Completed   HPV VACCINES  Aged Out    Health Maintenance  There are no preventive care reminders to display for this patient.   Colorectal cancer screening: No longer required.   Mammogram status: No longer required due to Age.  Bone Density status: Completed 05/07/21. Results reflect: Bone density results: OSTEOPOROSIS. Repeat every   years.  Lung Cancer Screening: (Low Dose CT Chest recommended if Age 70-80 years, 30 pack-year currently smoking OR have quit w/in 15years.)  qualify.    Additional Screening:  Hepatitis C Screening: does not qualify; Completed    Vision Screening: Recommended annual ophthalmology exams for early detection of glaucoma and other disorders of the eye. Is the patient up to date with their annual eye exam?  Yes  Who is the provider or what is the name of the office in which the patient attends annual eye exams? Dr Edilia Bo If pt is not established with a provider, would they like to be referred to a provider to establish care? No .   Dental Screening: Recommended  annual dental exams for proper oral hygiene  Community Resource Referral / Chronic Care Management:  CRR required this visit?  No   CCM required this visit?  No      Plan:     I have personally reviewed and noted the following in the patient's chart:   Medical and social history Use of alcohol, tobacco or illicit drugs  Current medications and supplements including opioid prescriptions. Patient is not currently taking opioid prescriptions. Functional ability and status Nutritional status Physical activity Advanced directives List of other physicians Hospitalizations, surgeries, and ER visits in previous 12 months Vitals Screenings to include cognitive, depression, and falls Referrals and appointments  In addition, I have reviewed and discussed with patient certain preventive protocols, quality metrics, and best practice recommendations. A written personalized care plan for preventive services as well as general preventive health recommendations were provided to patient.     Criselda Peaches, LPN   [id number]   Nurse Notes: None

## 2021-12-06 ENCOUNTER — Other Ambulatory Visit: Payer: Self-pay | Admitting: Family Medicine

## 2021-12-06 DIAGNOSIS — Z1231 Encounter for screening mammogram for malignant neoplasm of breast: Secondary | ICD-10-CM

## 2021-12-07 ENCOUNTER — Other Ambulatory Visit: Payer: Self-pay | Admitting: Family Medicine

## 2021-12-07 DIAGNOSIS — K219 Gastro-esophageal reflux disease without esophagitis: Secondary | ICD-10-CM

## 2021-12-10 NOTE — Telephone Encounter (Signed)
Last OV-10/02/21 Last refill-09/04/21-30 tabs, 2 refills  No future OV scheduled.

## 2021-12-22 ENCOUNTER — Other Ambulatory Visit: Payer: Self-pay | Admitting: Cardiology

## 2022-01-30 ENCOUNTER — Ambulatory Visit
Admission: RE | Admit: 2022-01-30 | Discharge: 2022-01-30 | Disposition: A | Discharge status: Home / Self Care | Payer: Medicare Other | Source: Ambulatory Visit | Attending: Family Medicine | Admitting: Family Medicine

## 2022-01-30 DIAGNOSIS — Z1231 Encounter for screening mammogram for malignant neoplasm of breast: Secondary | ICD-10-CM

## 2022-02-27 ENCOUNTER — Other Ambulatory Visit: Payer: Self-pay | Admitting: Family Medicine

## 2022-02-27 DIAGNOSIS — I10 Essential (primary) hypertension: Secondary | ICD-10-CM

## 2022-03-06 ENCOUNTER — Other Ambulatory Visit: Payer: Self-pay | Admitting: Family Medicine

## 2022-03-06 DIAGNOSIS — K219 Gastro-esophageal reflux disease without esophagitis: Secondary | ICD-10-CM

## 2022-03-12 ENCOUNTER — Other Ambulatory Visit: Payer: Self-pay | Admitting: Family Medicine

## 2022-03-12 DIAGNOSIS — M109 Gout, unspecified: Secondary | ICD-10-CM

## 2022-03-22 ENCOUNTER — Other Ambulatory Visit: Payer: Self-pay | Admitting: Family Medicine

## 2022-04-07 ENCOUNTER — Other Ambulatory Visit: Payer: Self-pay | Admitting: Family Medicine

## 2022-04-09 ENCOUNTER — Encounter: Payer: Self-pay | Admitting: Family Medicine

## 2022-04-09 ENCOUNTER — Ambulatory Visit (INDEPENDENT_AMBULATORY_CARE_PROVIDER_SITE_OTHER): Payer: Medicare Other | Admitting: Family Medicine

## 2022-04-09 VITALS — BP 128/70 | HR 60 | Temp 98.3°F | Wt 128.2 lb

## 2022-04-09 DIAGNOSIS — L819 Disorder of pigmentation, unspecified: Secondary | ICD-10-CM

## 2022-04-09 NOTE — Progress Notes (Signed)
   Subjective:    Patient ID: Jennifer Green, female    DOB: 01/24/1941, 82 y.o.   MRN: [id number]  HPI Here to check an area on her left hand that began changing colors 3 months ago. No hx of trauma. The area is never sore or painful. Over the course of 6-7 days it will suddenly begin as a small red area which then quickly enlarges to an area about 4 cm in diameter. This flat and not raised. The colors change to a deep red or purple for a few days, and then it will begin fading away. She had been taking an 81 mg ASA until this started, but she has stopped this. There are no other skin lesions anywhere.    Review of Systems  Constitutional: Negative.   Respiratory: Negative.    Cardiovascular: Negative.   Skin:  Positive for color change.       Objective:   Physical Exam Constitutional:      Appearance: Normal appearance.  Cardiovascular:     Rate and Rhythm: Normal rate and regular rhythm.     Pulses: Normal pulses.     Heart sounds: Normal heart sounds.  Pulmonary:     Effort: Pulmonary effort is normal.     Breath sounds: Normal breath sounds.  Skin:    Comments: There is a well defined area about 3 cm in diameter on the radial side of the left hand and left wrist today. This is not tender or warm. She shows me photos on her phone of this spot several days ago, and it was a deep violaceous color.   Neurological:     Mental Status: She is alert.           Assessment & Plan:  Recurrent color changes on the skin on the left hand. We will refer her to Dermatology to evaluate this.  Alysia Penna, MD

## 2022-05-22 NOTE — Progress Notes (Unsigned)
Jennifer Green Date of Birth: October 31, 1940 Medical Record #[id number]  History of Present Illness: Jennifer Green is seen today for followup of PVCs. Extensive evaluation in October 2012 including echocardiogram and stress Myoview study were normal. She did have coronary calcification noted on chest CT in 2/13. No history of angina.   She states she is doing very well from a cardiac standpoint. She has rare palpitations. BP is well controlled. Rare indigestion pain.  No syncope. She did have myalgias on Crestor and is no longer taking. Notes her husband passed in Dec - had dementia.    Current Outpatient Medications on File Prior to Visit  Medication Sig Dispense Refill   allopurinol (ZYLOPRIM) 300 MG tablet TAKE ONE TABLET BY MOUTH ONE TIME DAILY 90 tablet 0   amLODipine (NORVASC) 10 MG tablet TAKE ONE TABLET BY MOUTH ONE TIME DAILY 90 tablet 0   Ascorbic Acid (VITAMIN C) 1000 MG tablet Take 1,000 mg by mouth daily.     atenolol (TENORMIN) 25 MG tablet TAKE ONE TABLET BY MOUTH ONE TIME DAILY 90 tablet 0   Calcium Carb-Cholecalciferol (CALCIUM 600+D3 PO) Take 1 tablet by mouth daily.     diclofenac (VOLTAREN) 75 MG EC tablet Take 1 tablet (75 mg total) by mouth 2 (two) times daily. 180 tablet 3   dorzolamide-timolol (COSOPT) 22.3-6.8 MG/ML ophthalmic solution Place 1 drop into both eyes 2 (two) times daily.     hydrochlorothiazide (HYDRODIURIL) 25 MG tablet TAKE ONE TABLET BY MOUTH ONE TIME DAILY 90 tablet 0   levothyroxine (SYNTHROID) 50 MCG tablet TAKE ONE TABLET BY MOUTH ONE TIME DAILY 90 tablet 0   LORazepam (ATIVAN) 2 MG tablet TAKE ONE TABLET BY MOUTH AT BEDTIME 30 tablet 5   Magnesium 400 MG CAPS Take 400 mg by mouth every other day. In the morning     Netarsudil-Latanoprost (ROCKLATAN) 0.02-0.005 % SOLN Place 1 drop into both eyes nightly.     omeprazole (PRILOSEC) 40 MG capsule TAKE ONE CAPSULE BY MOUTH ONE TIME DAILY 90 capsule 0   pilocarpine (PILOCAR) 4 % ophthalmic solution Place 1  drop into both eyes 2 (two) times daily.     No current facility-administered medications on file prior to visit.    Allergies  Allergen Reactions   Indomethacin     Rapid Heart Beat   Promethazine Hcl Other (See Comments)    Couldn't stay still   Trazodone And Nefazodone Other (See Comments)    Hallucinations     Past Medical History:  Diagnosis Date   Allergy    Arthritis    Dyspnea    Elevated blood sugar    790.21   GERD (gastroesophageal reflux disease)    Glaucoma    Gout    Hypertension    Hypokalemia    Hypomagnesemia    Insomnia    Murmur    OP (osteoporosis)    PVC (premature ventricular contraction)     Past Surgical History:  Procedure Laterality Date   ANTERIOR CERVICAL DECOMP/DISCECTOMY FUSION N/A 04/28/2017   Procedure: ANTERIOR CERVICAL DECOMPRESSION/DISCECTOMY FUSION, INTERBODY PROSTHESIS CERVICAL SIX- CERVICAL SEVEN, EXPLORE FUSION, REMOVAL OF OLD SKYLINE PLATE CERVICAL FIVE- CERVICAL SIX;  Surgeon: Tressie Stalker, MD;  Location: [facility] OR;  Service: Neurosurgery;  Laterality: N/A;   BACK SURGERY     CERVICAL FUSION  05/2010   per Dr. Tressie Stalker    EYE SURGERY     right and left    Social History   Tobacco Use  Smoking Status Never  Smokeless Tobacco Never    Social History   Substance and Sexual Activity  Alcohol Use No    Family History  Problem Relation Age of Onset   Pneumonia Mother    Hypertension Mother    Breast cancer Mother    Heart disease Father     Review of Systems: As noted in history of present illness..  All other systems were reviewed and are negative.  Physical Exam: BP 128/66   Pulse 75   Ht 5' (1.524 m)   Wt 126 lb 12.8 oz (57.5 kg)   BMI 24.76 kg/m   GENERAL:  Well appearing WF in NAD HEENT:  PERRL, EOMI, sclera are clear. Oropharynx is clear. NECK:  No jugular venous distention, carotid upstroke brisk and symmetric, no bruits, no thyromegaly or adenopathy LUNGS:  Clear to auscultation  bilaterally CHEST:  Unremarkable HEART:  RRR,  PMI not displaced or sustained,S1 and S2 within normal limits, no S3, no S4: no clicks, no rubs, soft 1/6 systolic murmur ABD:  Soft, nontender. BS +, no masses or bruits. No hepatomegaly, no splenomegaly EXT:  2 + pulses throughout, no edema, no cyanosis no clubbing SKIN:  Warm and dry.  No rashes NEURO:  Alert and oriented x 3. Cranial nerves II through XII intact. PSYCH:  Cognitively intact    LABORATORY DATA:   Lab Results  Component Value Date   WBC 6.8 03/21/2021   HGB 13.9 03/21/2021   HCT 41.5 03/21/2021   PLT 348.0 03/21/2021   GLUCOSE 104 (H) 06/26/2021   CHOL 187 06/26/2021   TRIG 108.0 06/26/2021   HDL 58.70 06/26/2021   LDLDIRECT 121.0 04/08/2019   LDLCALC 107 (H) 06/26/2021   ALT 18 06/26/2021   AST 21 06/26/2021   NA 123 (L) 06/26/2021   K 4.3 06/26/2021   CL 85 (L) 06/26/2021   CREATININE 0.82 06/26/2021   BUN 20 06/26/2021   CO2 29 06/26/2021   TSH 0.80 03/21/2021   HGBA1C 6.1 03/21/2021   Ecg today shows NSR with PACs rate 75 . Low voltage.    I have personally reviewed and interpreted this study.   Assessment / Plan: 1. PVCs/PACs chronic and benign based on prior cardiac work up. Continue beta blocker therapy.   2. Coronary calcification with normal myoview study 2012. No symptoms of angina. Recommend risk factor modification.  3. HTN now well controlled   Follow up in one year.

## 2022-05-23 ENCOUNTER — Encounter: Payer: Self-pay | Admitting: Cardiology

## 2022-05-23 ENCOUNTER — Ambulatory Visit: Payer: Medicare Other | Attending: Cardiology | Admitting: Cardiology

## 2022-05-23 VITALS — BP 128/66 | HR 75 | Ht 60.0 in | Wt 126.8 lb

## 2022-05-23 DIAGNOSIS — I493 Ventricular premature depolarization: Secondary | ICD-10-CM | POA: Diagnosis present

## 2022-05-23 DIAGNOSIS — I10 Essential (primary) hypertension: Secondary | ICD-10-CM | POA: Insufficient documentation

## 2022-05-23 DIAGNOSIS — I2510 Atherosclerotic heart disease of native coronary artery without angina pectoris: Secondary | ICD-10-CM | POA: Insufficient documentation

## 2022-05-23 NOTE — Patient Instructions (Signed)
Medication Instructions:  NO CHANGES    Follow-Up: At [facility], you and your health needs are our priority.  As part of our continuing mission to provide you with exceptional heart care, we have created designated Provider Care Teams.  These Care Teams include your primary Cardiologist (physician) and Advanced Practice Providers (APPs -  Physician Assistants and Nurse Practitioners) who all work together to provide you with the care you need, when you need it.  We recommend signing up for the patient portal called "MyChart".  Sign up information is provided on this After Visit Summary.  MyChart is used to connect with patients for Virtual Visits (Telemedicine).  Patients are able to view lab/test results, encounter notes, upcoming appointments, etc.  Non-urgent messages can be sent to your provider as well.   To learn more about what you can do with MyChart, go to [email or web address].    Your next appointment:   12 months with Dr. [place]

## 2022-05-27 ENCOUNTER — Ambulatory Visit (INDEPENDENT_AMBULATORY_CARE_PROVIDER_SITE_OTHER): Payer: Medicare Other | Admitting: Dermatology

## 2022-05-27 VITALS — BP 99/63

## 2022-05-27 DIAGNOSIS — L720 Epidermal cyst: Secondary | ICD-10-CM

## 2022-05-27 DIAGNOSIS — L818 Other specified disorders of pigmentation: Secondary | ICD-10-CM

## 2022-05-27 NOTE — Patient Instructions (Addendum)
  Apply Differin gel to affected area of left hand at bedtime.  Recommend Arnica cream or gel as needed for help with bruising.

## 2022-05-27 NOTE — Progress Notes (Unsigned)
   New Patient Visit   Subjective  Jennifer Green is a 82 y.o. female who presents for the following: Spot of left hand that looks like a bruise but it is not sore. It comes and goes and started about 5-6 months ago. It comes up and stays for 3-4 days then goes away. It stays away for 1-2 weeks then comes back.She did have a cortisone shot in the area about 2 months before the spot showed up for the first time.   The following portions of the chart were reviewed this encounter and updated as appropriate: medications, allergies, medical history  Review of Systems:  No other skin or systemic complaints except as noted in HPI or Assessment and Plan.  Objective  Well appearing patient in no apparent distress; mood and affect are within normal limits.   A focused examination was performed of the following areas: Left hand   Relevant exam findings are noted in the Assessment and Plan.    Assessment & Plan   Steroid Atrophy with Ecchymosis  Exam: Light brown patch with signs of atrophy and subcutaneous vessels   Treatment Plan: Start Differin 0.1% gel qhs. Recommend Arnica cream or gel for bruising.  MILIA Exam: tiny erythematous firm white papule of left lat canthus  Discussed this is a type of cyst. Benign-appearing. Sometimes these will clear with OTC adapalene/Differin 0.1% cream QHS.  Symptomatic, irritating, patient would like treated. Advised may not be covered by insurance.  Procedure risks and benefits were discussed with the patient and verbal consent was obtained. Following prep of the skin on the right oral commissure with an alcohol swab, area was injected with 1% lidocaine/epinephrine, extraction of milia was performed with cotton tip applicators following superficial incision made over their surfaces with a #11 surgical blade. Capillary hemostasis was achieved with 20% aluminum chloride solution. Vaseline ointment was applied to each site. The patient tolerated the  procedure well.    Return in about 3 months (around 08/26/2022) for TBSE, Follow up.  I, Joanie Coddington, CMA, am acting as scribe for Langston Reusing, MD .   Documentation: I have reviewed the above documentation for accuracy and completeness, and I agree with the above.  Langston Reusing, MD

## 2022-05-28 ENCOUNTER — Encounter: Payer: Self-pay | Admitting: Dermatology

## 2022-05-28 NOTE — Progress Notes (Signed)
   New Patient Visit   Subjective  Jennifer Green is a 82 y.o. female who presents for the following: Spot of left hand that looks like a bruise but it is not sore. It comes and goes and started about 5-6 months ago. It comes up and stays for 3-4 days then goes away. It stays away for 1-2 weeks then comes back.She did have a cortisone shot in the area about 2 months before the spot showed up for the first time.   The following portions of the chart were reviewed this encounter and updated as appropriate: medications, allergies, medical history  Review of Systems:  No other skin or systemic complaints except as noted in HPI or Assessment and Plan.  Objective  Well appearing patient in no apparent distress; mood and affect are within normal limits.   A focused examination was performed of the following areas: Left hand   Relevant exam findings are noted in the Assessment and Plan.    Assessment & Plan   Steroid Atrophy with Ecchymosis  Exam: Light brown patch with signs of atrophy and subcutaneous vessels   Explained to pt that the intrarticular kenalog injection she had performed a few weeks ago for wrist pain is the source for the atrophy and fragile blood vessels that have been causing frequent bruising.  I reassured her that the atrophy is self limiting but can take a few months to improve.  Topical retinoids can help stimulate collagen.  Treatment Plan: Start Differin 0.1% gel qhs. Recommend Arnica cream or gel for bruising.  MILIA Exam: tiny erythematous firm white papule of left lat canthus  Discussed this is a type of cyst. Benign-appearing. Sometimes these will clear with OTC adapalene/Differin 0.1% cream QHS.  Symptomatic, irritating, patient would like treated.   Procedure risks and benefits were discussed with the patient and verbal consent was obtained. Following prep of the skin on the right oral commissure with an alcohol swab, area was injected with 1%  lidocaine/epinephrine, extraction of milia was performed with cotton tip applicators following superficial incision made over their surfaces with a #11 surgical blade. Capillary hemostasis was achieved with 20% aluminum chloride solution. Vaseline ointment was applied to each site. The patient tolerated the procedure well.    Return in about 3 months (around 08/26/2022) for TBSE, Follow up.  I, Joanie Coddington, CMA, am acting as scribe for Langston Reusing, MD .   Documentation: I have reviewed the above documentation for accuracy and completeness, and I agree with the above.  Langston Reusing, MD

## 2022-05-29 ENCOUNTER — Other Ambulatory Visit: Payer: Self-pay | Admitting: Family Medicine

## 2022-06-05 ENCOUNTER — Other Ambulatory Visit: Payer: Self-pay | Admitting: Family Medicine

## 2022-06-05 DIAGNOSIS — M109 Gout, unspecified: Secondary | ICD-10-CM

## 2022-06-05 DIAGNOSIS — I10 Essential (primary) hypertension: Secondary | ICD-10-CM

## 2022-06-05 DIAGNOSIS — K219 Gastro-esophageal reflux disease without esophagitis: Secondary | ICD-10-CM

## 2022-06-06 ENCOUNTER — Other Ambulatory Visit: Payer: Self-pay

## 2022-06-06 DIAGNOSIS — K219 Gastro-esophageal reflux disease without esophagitis: Secondary | ICD-10-CM

## 2022-06-06 DIAGNOSIS — M109 Gout, unspecified: Secondary | ICD-10-CM

## 2022-06-06 DIAGNOSIS — I10 Essential (primary) hypertension: Secondary | ICD-10-CM

## 2022-06-06 MED ORDER — LEVOTHYROXINE SODIUM 50 MCG PO TABS: 50.0000 ug | ORAL_TABLET | Freq: Every day | ORAL | 0 refills | Status: DC

## 2022-06-06 MED ORDER — AMLODIPINE BESYLATE 10 MG PO TABS: 10.0000 mg | ORAL_TABLET | Freq: Every day | ORAL | 0 refills | Status: DC

## 2022-06-06 MED ORDER — ATENOLOL 25 MG PO TABS: 25.0000 mg | ORAL_TABLET | Freq: Every day | ORAL | 0 refills | Status: DC

## 2022-06-06 MED ORDER — ALLOPURINOL 300 MG PO TABS: 300.0000 mg | ORAL_TABLET | Freq: Every day | ORAL | 0 refills | Status: DC

## 2022-06-06 MED ORDER — OMEPRAZOLE 40 MG PO CPDR: 40.0000 mg | DELAYED_RELEASE_CAPSULE | Freq: Every day | ORAL | 0 refills | Status: DC

## 2022-07-04 ENCOUNTER — Encounter: Payer: Self-pay | Admitting: Family Medicine

## 2022-07-04 ENCOUNTER — Ambulatory Visit (INDEPENDENT_AMBULATORY_CARE_PROVIDER_SITE_OTHER): Payer: Medicare Other | Admitting: Family Medicine

## 2022-07-04 VITALS — BP 118/70 | HR 56 | Temp 97.8°F | Ht 60.0 in | Wt 123.0 lb

## 2022-07-04 DIAGNOSIS — R0600 Dyspnea, unspecified: Secondary | ICD-10-CM

## 2022-07-04 DIAGNOSIS — M1A9XX0 Chronic gout, unspecified, without tophus (tophi): Secondary | ICD-10-CM

## 2022-07-04 DIAGNOSIS — R600 Localized edema: Secondary | ICD-10-CM

## 2022-07-04 DIAGNOSIS — E039 Hypothyroidism, unspecified: Secondary | ICD-10-CM

## 2022-07-04 DIAGNOSIS — K219 Gastro-esophageal reflux disease without esophagitis: Secondary | ICD-10-CM | POA: Diagnosis not present

## 2022-07-04 DIAGNOSIS — M19012 Primary osteoarthritis, left shoulder: Secondary | ICD-10-CM

## 2022-07-04 DIAGNOSIS — R739 Hyperglycemia, unspecified: Secondary | ICD-10-CM

## 2022-07-04 DIAGNOSIS — E785 Hyperlipidemia, unspecified: Secondary | ICD-10-CM

## 2022-07-04 DIAGNOSIS — I10 Essential (primary) hypertension: Secondary | ICD-10-CM

## 2022-07-04 DIAGNOSIS — G4700 Insomnia, unspecified: Secondary | ICD-10-CM

## 2022-07-04 DIAGNOSIS — R0689 Other abnormalities of breathing: Secondary | ICD-10-CM

## 2022-07-04 LAB — CBC WITH DIFFERENTIAL/PLATELET
Basophils Absolute: 0 10*3/uL (ref 0.0–0.1)
Basophils Relative: 0.6 % (ref 0.0–3.0)
Eosinophils Absolute: 0.1 10*3/uL (ref 0.0–0.7)
Eosinophils Relative: 0.8 % (ref 0.0–5.0)
HCT: 40.8 % (ref 36.0–46.0)
Hemoglobin: 13.7 g/dL (ref 12.0–15.0)
Lymphocytes Relative: 29.1 % (ref 12.0–46.0)
Lymphs Abs: 1.9 10*3/uL (ref 0.7–4.0)
MCHC: 33.6 g/dL (ref 30.0–36.0)
MCV: 95.2 fl (ref 78.0–100.0)
Monocytes Absolute: 0.5 10*3/uL (ref 0.1–1.0)
Monocytes Relative: 7.1 % (ref 3.0–12.0)
Neutro Abs: 4 10*3/uL (ref 1.4–7.7)
Neutrophils Relative %: 62.4 % (ref 43.0–77.0)
Platelets: 319 10*3/uL (ref 150.0–400.0)
RBC: 4.28 Mil/uL (ref 3.87–5.11)
RDW: 15.1 % (ref 11.5–15.5)
WBC: 6.4 10*3/uL (ref 4.0–10.5)

## 2022-07-04 LAB — LIPID PANEL
Cholesterol: 167 mg/dL (ref 0–200)
HDL: 37.9 mg/dL — ABNORMAL LOW (ref >39.00)
LDL Cholesterol: 92 mg/dL (ref 0–99)
NonHDL: 129.09
Total CHOL/HDL Ratio: 4
Triglycerides: 187 mg/dL — ABNORMAL HIGH (ref 0.0–149.0)
VLDL: 37.4 mg/dL (ref 0.0–40.0)

## 2022-07-04 LAB — HEPATIC FUNCTION PANEL
ALT: 23 U/L (ref 0–35)
AST: 25 U/L (ref 0–37)
Albumin: 4.5 g/dL (ref 3.5–5.2)
Alkaline Phosphatase: 58 U/L (ref 39–117)
Bilirubin, Direct: 0.1 mg/dL (ref 0.0–0.3)
Total Bilirubin: 0.6 mg/dL (ref 0.2–1.2)
Total Protein: 7.3 g/dL (ref 6.0–8.3)

## 2022-07-04 LAB — BASIC METABOLIC PANEL
BUN: 20 mg/dL (ref 6–23)
CO2: 29 mEq/L (ref 19–32)
Calcium: 9.8 mg/dL (ref 8.4–10.5)
Chloride: 94 mEq/L — ABNORMAL LOW (ref 96–112)
Creatinine, Ser: 0.88 mg/dL (ref 0.40–1.20)
GFR: 60.43 mL/min (ref >60.00)
Glucose, Bld: 116 mg/dL — ABNORMAL HIGH (ref 70–99)
Potassium: 4.2 mEq/L (ref 3.5–5.1)
Sodium: 133 mEq/L — ABNORMAL LOW (ref 135–145)

## 2022-07-04 LAB — T3, FREE: T3, Free: 2.7 pg/mL (ref 2.3–4.2)

## 2022-07-04 LAB — T4, FREE: Free T4: 1.2 ng/dL (ref 0.60–1.60)

## 2022-07-04 LAB — URIC ACID: Uric Acid, Serum: 3.6 mg/dL (ref 2.4–7.0)

## 2022-07-04 LAB — TSH: TSH: 1.2 u[IU]/mL (ref 0.35–5.50)

## 2022-07-04 LAB — HEMOGLOBIN A1C: Hgb A1c MFr Bld: 6.1 % (ref 4.6–6.5)

## 2022-07-04 LAB — MAGNESIUM: Magnesium: 1.6 mg/dL (ref 1.5–2.5)

## 2022-07-04 MED ORDER — HYDROCHLOROTHIAZIDE 25 MG PO TABS: 25.0000 mg | ORAL_TABLET | Freq: Every day | ORAL | 3 refills | Status: DC

## 2022-07-04 MED ORDER — DIAZEPAM 10 MG PO TABS: 10.0000 mg | ORAL_TABLET | Freq: Every evening | ORAL | 5 refills | Status: DC | PRN

## 2022-07-04 NOTE — Progress Notes (Signed)
Subjective:    Patient ID: Jennifer Green, female    DOB: [birth year], 82 y.o.   MRN: [id number]  HPI Here to follow up on issues. Jennifer Green main concern is trouble sleeping. She cannot relax in the evenings. She has been taking Lorazepam at bedtime for several years, and it does not help the way it used to. She says she took Valium many years ago, and this was helpful. Jennifer Green HTN and gout are stable. Jennifer Green OA and edema are stable.    Review of Systems  Constitutional: Negative.   HENT: Negative.    Eyes: Negative.   Respiratory: Negative.    Cardiovascular: Negative.   Gastrointestinal: Negative.   Genitourinary:  Negative for decreased urine volume, difficulty urinating, dyspareunia, dysuria, enuresis, flank pain, frequency, hematuria, pelvic pain and urgency.  Musculoskeletal:  Positive for arthralgias.  Skin: Negative.   Neurological: Negative.  Negative for headaches.  Psychiatric/Behavioral:  Positive for sleep disturbance. Negative for agitation, confusion, decreased concentration, dysphoric mood and hallucinations. The patient is nervous/anxious.        Objective:   Physical Exam Constitutional:      General: She is not in acute distress.    Appearance: Normal appearance. She is well-developed.  HENT:     Head: Normocephalic and atraumatic.     Right Ear: External ear normal.     Left Ear: External ear normal.     Nose: Nose normal.     Mouth/Throat:     Pharynx: No oropharyngeal exudate.  Eyes:     General: No scleral icterus.    Conjunctiva/sclera: Conjunctivae normal.     Pupils: Pupils are equal, round, and reactive to light.  Neck:     Thyroid: No thyromegaly.     Vascular: No JVD.  Cardiovascular:     Rate and Rhythm: Normal rate and regular rhythm.     Heart sounds: Normal heart sounds. No murmur heard.    No friction rub. No gallop.  Pulmonary:     Effort: Pulmonary effort is normal. No respiratory distress.     Breath sounds: Normal breath sounds. No wheezing  or rales.  Chest:     Chest wall: No tenderness.  Abdominal:     General: Bowel sounds are normal. There is no distension.     Palpations: Abdomen is soft. There is no mass.     Tenderness: There is no abdominal tenderness. There is no guarding or rebound.  Musculoskeletal:        General: No tenderness. Normal range of motion.     Cervical back: Normal range of motion and neck supple.  Lymphadenopathy:     Cervical: No cervical adenopathy.  Skin:    General: Skin is warm and dry.     Findings: No erythema or rash.  Neurological:     General: No focal deficit present.     Mental Status: She is alert and oriented to person, place, and time.     Cranial Nerves: No cranial nerve deficit.     Motor: No abnormal muscle tone.     Coordination: Coordination normal.     Deep Tendon Reflexes: Reflexes are normal and symmetric. Reflexes normal.  Psychiatric:        Mood and Affect: Mood normal.        Behavior: Behavior normal.        Thought Content: Thought content normal.        Judgment: Judgment normal.  Assessment & Plan:  Jennifer Green HTN and gout and OA and edema are stable. We ill get fasting labs to check lipids, a thyroid panel, etc. For insomnia, she will stop Lorazepam and try Diazepam 10 mg at bedtime. We spent a total of ( 34  ) minutes reviewing records and discussing these issues.  Gershon Crane, MD

## 2022-08-26 ENCOUNTER — Ambulatory Visit (INDEPENDENT_AMBULATORY_CARE_PROVIDER_SITE_OTHER): Payer: Medicare Other | Admitting: Dermatology

## 2022-08-26 ENCOUNTER — Encounter: Payer: Self-pay | Admitting: Dermatology

## 2022-08-26 VITALS — BP 108/71 | HR 63

## 2022-08-26 DIAGNOSIS — L578 Other skin changes due to chronic exposure to nonionizing radiation: Secondary | ICD-10-CM

## 2022-08-26 DIAGNOSIS — R58 Hemorrhage, not elsewhere classified: Secondary | ICD-10-CM

## 2022-08-26 DIAGNOSIS — D229 Melanocytic nevi, unspecified: Secondary | ICD-10-CM

## 2022-08-26 DIAGNOSIS — Z1283 Encounter for screening for malignant neoplasm of skin: Secondary | ICD-10-CM

## 2022-08-26 DIAGNOSIS — L570 Actinic keratosis: Secondary | ICD-10-CM

## 2022-08-26 DIAGNOSIS — L908 Other atrophic disorders of skin: Secondary | ICD-10-CM

## 2022-08-26 DIAGNOSIS — W908XXA Exposure to other nonionizing radiation, initial encounter: Secondary | ICD-10-CM

## 2022-08-26 DIAGNOSIS — L821 Other seborrheic keratosis: Secondary | ICD-10-CM

## 2022-08-26 DIAGNOSIS — L814 Other melanin hyperpigmentation: Secondary | ICD-10-CM

## 2022-08-26 DIAGNOSIS — D1801 Hemangioma of skin and subcutaneous tissue: Secondary | ICD-10-CM

## 2022-08-26 NOTE — Patient Instructions (Addendum)
Thank you for visiting [place] today. We appreciate your dedication to maintaining your skin health and are pleased to see the improvements.  Here are the key instructions from today's visit:  - Differin Gel: Continue using Differin gel on the left wrist to help build collagen. Remember to apply plenty of moisturizer as Differin can dry out the skin.  - Moisturizing: Apply moisturizer right after showering when the skin is still damp to lock in moisture effectively. We provided samples of good moisturizers for you to try.  - Skin Examination: We conducted a full-body skin check to ensure there are no signs of skin cancer or other suspicious conditions. Everything looks good, with only benign conditions noted.  - Actinic Keratosis Treatment: We treated a small spot of actinic keratosis on your right cheek to prevent it from developing into skin cancer. Please treat the area at home with Vaseline in the morning and at night. It is okay for it to get wet in the shower.  - Follow-Up: Please return in one year for a routine check-up, or sooner if you notice any suspicious changes in your skin.  Thank you for taking such good care of your skin. Keep up the great work, and do not hesitate to contact us if you have any questions or concerns.      Cryotherapy Aftercare  Wash gently with soap and water everyday.   Apply Vaseline Jelly daily until healed.     Due to recent changes in healthcare laws, you may see results of your pathology and/or laboratory studies on MyChart before the doctors have had a chance to review them. We understand that in some cases there may be results that are confusing or concerning to you. Please understand that not all results are received at the same time and often the doctors may need to interpret multiple results in order to provide you with the best plan of care or course of treatment. Therefore, we ask that you please give [place] 2 business days to thoroughly review all  your results before contacting the office for clarification. Should we see a critical lab result, you will be contacted sooner.   If You Need Anything After Your Visit  If you have any questions or concerns for your doctor, please call our main line at [phone] If no one answers, please leave a voicemail as directed and we will return your call as soon as possible. Messages left after 4 pm will be answered the following business day.   You may also send [place] a message via MyChart. We typically respond to MyChart messages within 1-2 business days.  For prescription refills, please ask your pharmacy to contact our office. Our fax number is [phone].  If you have an urgent issue when the clinic is closed that cannot wait until the next business day, you can page your doctor at the number below.    Please note that while we do our best to be available for urgent issues outside of office hours, we are not available 24/7.   If you have an urgent issue and are unable to reach [place], you may choose to seek medical care at your doctor's office, retail clinic, urgent care center, or emergency room.  If you have a medical emergency, please immediately call 911 or go to the emergency department. In the event of inclement weather, please call our main line at [phone] for an update on the status of any delays or closures.  Dermatology Medication Tips: Please  keep the boxes that topical medications come in in order to help keep track of the instructions about where and how to use these. Pharmacies typically print the medication instructions only on the boxes and not directly on the medication tubes.   If your medication is too expensive, please contact our office at [phone] or send [place] a message through MyChart.   We are unable to tell what your co-pay for medications will be in advance as this is different depending on your insurance coverage. However, we may be able to find a substitute  medication at lower cost or fill out paperwork to get insurance to cover a needed medication.   If a prior authorization is required to get your medication covered by your insurance company, please allow [place] 1-2 business days to complete this process.  Drug prices often vary depending on where the prescription is filled and some pharmacies may offer cheaper prices.  The website www.goodrx.com contains coupons for medications through different pharmacies. The prices here do not account for what the cost may be with help from insurance (it may be cheaper with your insurance), but the website can give you the price if you did not use any insurance.  - You can print the associated coupon and take it with your prescription to the pharmacy.  - You may also stop by our office during regular business hours and pick up a GoodRx coupon card.  - If you need your prescription sent electronically to a different pharmacy, notify our office through [facility] or by phone at [phone]

## 2022-08-26 NOTE — Progress Notes (Signed)
Follow-Up Visit   Subjective  Jennifer Green is a 82 y.o. female who presents for the following: Skin Cancer Screening and Full Body Skin Exam. No personal Hx of skin cancer, dysplastic nevi or actinic keratoses.  Steroid Atrophy with Ecchymosis. 3 month follow up. Left wrist. Has been using Differin 0.1% gel at bedtime as directed. Has noticed some improvement. Tolerating Differin well, denies irritation.    The patient presents for Total-Body Skin Exam (TBSE) for skin cancer screening and mole check. The patient has spots, moles and lesions to be evaluated, some may be new or changing and the patient may have concern these could be cancer.    The following portions of the chart were reviewed this encounter and updated as appropriate: medications, allergies, medical history  Review of Systems:  No other skin or systemic complaints except as noted in HPI or Assessment and Plan.  Objective  Well appearing patient in no apparent distress; mood and affect are within normal limits.  A full examination was performed including scalp, head, eyes, ears, nose, lips, neck, chest, axillae, abdomen, back, buttocks, bilateral upper extremities, bilateral lower extremities, hands, feet, fingers, toes, fingernails, and toenails. All findings within normal limits unless otherwise noted below.   Relevant physical exam findings are noted in the Assessment and Plan.  right cheek x1 Erythematous thin papules/macules with gritty scale.     Assessment & Plan   SKIN CANCER SCREENING PERFORMED TODAY.  ACTINIC DAMAGE - Chronic condition, secondary to cumulative UV/sun exposure - diffuse scaly erythematous macules with underlying dyspigmentation - Recommend daily broad spectrum sunscreen SPF 30+ to sun-exposed areas, reapply every 2 hours as needed.  - Staying in the shade or wearing long sleeves, sun glasses (UVA+UVB protection) and wide brim hats (4-inch brim around the entire circumference of the  hat) are also recommended for sun protection.  - Call for new or changing lesions.  LENTIGINES, SEBORRHEIC KERATOSES, HEMANGIOMAS - Benign normal skin lesions - Benign-appearing - Call for any changes  MELANOCYTIC NEVI - Tan-brown and/or pink-flesh-colored symmetric macules and papules - Benign appearing on exam today - Observation - Call clinic for new or changing moles - Recommend daily use of broad spectrum spf 30+ sunscreen to sun-exposed areas.    Steroid Atrophy with Ecchymosis   Exam: Light brown patch with signs of atrophy and subcutaneous vessels with signs of improvement at left wrist..   Treatment Plan: Start Differin 0.1% gel qhs. Recommend Arnica cream or gel for bruising.   AK (actinic keratosis) right cheek x1  Actinic keratoses are precancerous spots that appear secondary to cumulative UV radiation exposure/sun exposure over time. They are chronic with expected duration over 1 year. A portion of actinic keratoses will progress to squamous cell carcinoma of the skin. It is not possible to reliably predict which spots will progress to skin cancer and so treatment is recommended to prevent development of skin cancer.  Recommend daily broad spectrum sunscreen SPF 30+ to sun-exposed areas, reapply every 2 hours as needed.  Recommend staying in the shade or wearing long sleeves, sun glasses (UVA+UVB protection) and wide brim hats (4-inch brim around the entire circumference of the hat). Call for new or changing lesions.  Destruction of lesion - right cheek x1  Destruction method: cryotherapy   Informed consent: discussed and consent obtained   Lesion destroyed using liquid nitrogen: Yes   Region frozen until ice ball extended beyond lesion: Yes   Outcome: patient tolerated procedure well with no complications  Post-procedure details: wound care instructions given   Additional details:  Prior to procedure, discussed risks of blister formation, small wound, skin  dyspigmentation, or rare scar following cryotherapy. Recommend Vaseline ointment to treated areas while healing.    Return in about 1 year (around 08/26/2023).  I, Jennifer Green, CMA, am acting as scribe for [employer], DO.   Documentation: I have reviewed the above documentation for accuracy and completeness, and I agree with the above.  Jennifer Reusing, DO

## 2022-09-03 ENCOUNTER — Other Ambulatory Visit: Payer: Self-pay | Admitting: Family Medicine

## 2022-09-03 DIAGNOSIS — M109 Gout, unspecified: Secondary | ICD-10-CM

## 2022-09-03 DIAGNOSIS — I10 Essential (primary) hypertension: Secondary | ICD-10-CM

## 2022-09-03 DIAGNOSIS — K219 Gastro-esophageal reflux disease without esophagitis: Secondary | ICD-10-CM

## 2022-10-03 ENCOUNTER — Other Ambulatory Visit: Payer: Self-pay | Admitting: Cardiology

## 2022-10-21 ENCOUNTER — Ambulatory Visit: Payer: Medicare Other | Admitting: Cardiology

## 2022-10-28 ENCOUNTER — Ambulatory Visit (INDEPENDENT_AMBULATORY_CARE_PROVIDER_SITE_OTHER): Payer: Medicare Other | Admitting: Family Medicine

## 2022-10-28 VITALS — BP 118/70 | HR 59 | Temp 98.2°F | Wt 135.0 lb

## 2022-10-28 DIAGNOSIS — K769 Liver disease, unspecified: Secondary | ICD-10-CM

## 2022-10-28 DIAGNOSIS — R1030 Lower abdominal pain, unspecified: Secondary | ICD-10-CM | POA: Diagnosis not present

## 2022-10-28 DIAGNOSIS — R9389 Abnormal findings on diagnostic imaging of other specified body structures: Secondary | ICD-10-CM

## 2022-10-28 LAB — POC URINALSYSI DIPSTICK (AUTOMATED)
Bilirubin, UA: NEGATIVE
Blood, UA: NEGATIVE
Glucose, UA: NEGATIVE
Ketones, UA: NEGATIVE
Leukocytes, UA: NEGATIVE
Nitrite, UA: NEGATIVE
Protein, UA: NEGATIVE
Spec Grav, UA: 1.02 (ref 1.010–1.025)
Urobilinogen, UA: 0.2 U/dL
pH, UA: 5 (ref 5.0–8.0)

## 2022-10-28 NOTE — Addendum Note (Signed)
Addended by: Carola Rhine on: 10/28/2022 05:24 PM   Modules accepted: Orders

## 2022-10-28 NOTE — Progress Notes (Signed)
Subjective:    Patient ID: Jennifer Green, female    DOB: 15-Feb-1940, 82 y.o.   MRN: [id number]  HPI Here for 3 months of intermittent sharp pains across the lower abdomen, but which are worst in the RLQ. These occur multiple times every day. When feels one of these pains it makes her feel like she has to urinate, but when she tries nothing comes out. Then when she urinates at other times, this is normal (no blood, no burning). Her BM's are a bit erratic, sometimes constipated and sometimes not. No fever or nausea. She has been working with Dr. Tressie Stalker for low back pain. She has had 2 epidural steroid injections but these have not helped.    Review of Systems  Constitutional: Negative.   Respiratory: Negative.    Cardiovascular: Negative.   Gastrointestinal: Negative.   Genitourinary:  Positive for urgency. Negative for dysuria, flank pain, frequency and hematuria.       Objective:   Physical Exam Constitutional:      Appearance: Normal appearance. She is not ill-appearing.  Cardiovascular:     Rate and Rhythm: Normal rate and regular rhythm.     Pulses: Normal pulses.     Heart sounds: Normal heart sounds.  Pulmonary:     Effort: Pulmonary effort is normal.     Breath sounds: Normal breath sounds.  Abdominal:     General: Abdomen is flat. Bowel sounds are normal. There is no distension.     Palpations: Abdomen is soft. There is no mass.     Tenderness: There is no left CVA tenderness, guarding or rebound.     Hernia: No hernia is present.     Comments: She is tender all across the lower abdomen, but especially in the RLQ.   Neurological:     Mental Status: She is alert.           Assessment & Plan:  Lower abdominal pain. The etiology is unclear. We wil set up a contrasted CT of the abdomen and pelvis to evaluate.  Gershon Crane, MD

## 2022-10-29 LAB — BASIC METABOLIC PANEL
BUN: 21 mg/dL (ref 6–23)
CO2: 27 meq/L (ref 19–32)
Calcium: 9.3 mg/dL (ref 8.4–10.5)
Chloride: 102 meq/L (ref 96–112)
Creatinine, Ser: 0.87 mg/dL (ref 0.40–1.20)
GFR: 61.12 mL/min (ref >60.00)
Glucose, Bld: 101 mg/dL — ABNORMAL HIGH (ref 70–99)
Potassium: 3.9 meq/L (ref 3.5–5.1)
Sodium: 139 meq/L (ref 135–145)

## 2022-11-12 ENCOUNTER — Ambulatory Visit
Admission: RE | Admit: 2022-11-12 | Discharge: 2022-11-12 | Disposition: A | Discharge status: Home / Self Care | Payer: Medicare Other | Source: Ambulatory Visit | Attending: Family Medicine | Admitting: Family Medicine

## 2022-11-12 DIAGNOSIS — R1030 Lower abdominal pain, unspecified: Secondary | ICD-10-CM

## 2022-11-12 MED ORDER — IOPAMIDOL (ISOVUE-300) INJECTION 61%
500.0000 mL | Freq: Once | INTRAVENOUS | Status: AC | PRN
  Administered 2022-11-12: 100 mL via INTRAVENOUS

## 2022-11-17 ENCOUNTER — Encounter: Payer: Self-pay | Admitting: Family Medicine

## 2022-11-26 NOTE — Telephone Encounter (Signed)
Spoke with pt advised that radiology Dep is behind reading imaging, advised to call them and see if she can have the report released. Attempted to contact [facility] for pt CT results with no success.

## 2022-11-26 NOTE — Telephone Encounter (Signed)
Daughter called, somewhat perturbed, stating 2 weeks is way too long to wait for these results. They are requesting a call back, at your earliest convenience, to discuss.

## 2022-11-29 NOTE — Addendum Note (Signed)
Addended by: Gershon Crane A on: 11/29/2022 07:52 AM   Modules accepted: Orders

## 2022-12-02 ENCOUNTER — Other Ambulatory Visit: Payer: Self-pay | Admitting: Family Medicine

## 2022-12-02 ENCOUNTER — Other Ambulatory Visit: Payer: Self-pay | Admitting: Cardiology

## 2022-12-02 DIAGNOSIS — M109 Gout, unspecified: Secondary | ICD-10-CM

## 2022-12-02 DIAGNOSIS — I10 Essential (primary) hypertension: Secondary | ICD-10-CM

## 2022-12-02 DIAGNOSIS — K219 Gastro-esophageal reflux disease without esophagitis: Secondary | ICD-10-CM

## 2022-12-03 ENCOUNTER — Ambulatory Visit
Admission: RE | Admit: 2022-12-03 | Discharge: 2022-12-03 | Disposition: A | Discharge status: Home / Self Care | Payer: Medicare Other | Source: Ambulatory Visit | Attending: Family Medicine | Admitting: Family Medicine

## 2022-12-03 DIAGNOSIS — R9389 Abnormal findings on diagnostic imaging of other specified body structures: Secondary | ICD-10-CM

## 2022-12-17 ENCOUNTER — Encounter: Payer: Self-pay | Admitting: Family Medicine

## 2022-12-20 NOTE — Telephone Encounter (Signed)
Checking on progress of results

## 2022-12-23 NOTE — Telephone Encounter (Signed)
I agree, this is ridiculous. Please ask Radiology for an expedited reading TODAY

## 2022-12-23 NOTE — Telephone Encounter (Signed)
Any [place] report received for this pt

## 2022-12-26 NOTE — Telephone Encounter (Signed)
Attempted to speak with Radiology department regarding pt imaging results with no success of speaking with a human being, connected to different ext by the automated system. Will keep trying

## 2023-01-01 NOTE — Telephone Encounter (Signed)
Reviewed [place] results with pt verbalized understanding

## 2023-01-02 NOTE — Progress Notes (Signed)
 Reviewed [place] results with pt, verbalized understanding.

## 2023-01-11 ENCOUNTER — Ambulatory Visit
Admission: RE | Admit: 2023-01-11 | Discharge: 2023-01-11 | Disposition: A | Discharge status: Home / Self Care | Payer: Medicare Other | Source: Ambulatory Visit | Attending: Family Medicine | Admitting: Family Medicine

## 2023-01-11 DIAGNOSIS — K769 Liver disease, unspecified: Secondary | ICD-10-CM

## 2023-01-11 MED ORDER — GADOPICLENOL 0.5 MMOL/ML IV SOLN
6.0000 mL | Freq: Once | INTRAVENOUS | Status: AC | PRN
  Administered 2023-01-11: 6 mL via INTRAVENOUS

## 2023-01-17 ENCOUNTER — Other Ambulatory Visit: Payer: Self-pay | Admitting: Family Medicine

## 2023-01-29 ENCOUNTER — Encounter: Payer: Self-pay | Admitting: Family Medicine

## 2023-01-29 ENCOUNTER — Ambulatory Visit: Payer: Medicare Other | Admitting: Family Medicine

## 2023-01-29 VITALS — BP 110/60 | HR 61 | Temp 97.9°F | Wt 131.4 lb

## 2023-01-29 DIAGNOSIS — G8929 Other chronic pain: Secondary | ICD-10-CM

## 2023-01-29 DIAGNOSIS — M545 Low back pain, unspecified: Secondary | ICD-10-CM | POA: Insufficient documentation

## 2023-01-29 DIAGNOSIS — R1031 Right lower quadrant pain: Secondary | ICD-10-CM | POA: Diagnosis not present

## 2023-01-29 MED ORDER — GABAPENTIN 100 MG PO CAPS: 100.0000 mg | ORAL_CAPSULE | Freq: Three times a day (TID) | ORAL | 3 refills | Status: DC

## 2023-01-29 NOTE — Progress Notes (Signed)
   Subjective:    Patient ID: Jennifer Green, female    DOB: 1940-02-12, 82 y.o.   MRN: [id number]  HPI Here to follow up on lower abdominal pains that started about 6 months ago. These are usually worse on the right side. No fever or nausea or vomiting. She has some urgency with stools after eating, and sometimes the stools are loose and sometimes normal. No blood seen in the stools. Her appetite has decreased and she has lost about 4 lbs since September. We saw her on 10-28-22 and did some labs that were normal. She had an abdominal CT that showed a liver lesion but nothing that could explain her pain. She had an abdominal US showing gallstones but no signs of inflammation, so this did not explain her pain either. She had an abdominal MRI to look at the liver lesion, and this was indeterminate whether it was benign or not, so we are planning to get a 6 month follow up MRI to for this lesion. Again no etiology of her pain was found. She thought her Allopurinol may have been the culprit so she stopped taking this 2 months ago. This has had no effect on her pain. Of note she has significant spinal issues causing back pain, and she has had an epidural steroid injection with no benefit. A nerve ablation was proposed but she declined getting this. She is not a surgical candidate.    Review of Systems  Constitutional: Negative.   Respiratory: Negative.    Cardiovascular: Negative.   Gastrointestinal:  Positive for abdominal pain and diarrhea. Negative for abdominal distention, blood in stool, constipation, nausea, rectal pain and vomiting.  Genitourinary: Negative.   Musculoskeletal:  Positive for back pain.       Objective:   Physical Exam Constitutional:      General: She is not in acute distress.    Appearance: Normal appearance.  Cardiovascular:     Rate and Rhythm: Normal rate and regular rhythm.     Pulses: Normal pulses.     Heart sounds: Normal heart sounds.  Pulmonary:     Effort:  Pulmonary effort is normal.     Breath sounds: Normal breath sounds.  Abdominal:     General: Abdomen is flat. Bowel sounds are normal. There is no distension.     Palpations: Abdomen is soft. There is no mass.     Tenderness: There is no right CVA tenderness, guarding or rebound.     Hernia: No hernia is present.     Comments: She is moderately tender in the RLQ and mildly tender in the LLQ.   Neurological:     Mental Status: She is alert.           Assessment & Plan:  She has chronic lower abdominal pain and no etiology has been found. We will refer her to GI to evaluate further. For her back pain, we will try Gabapentin 100 mg TID. We spent a total of ( 35  ) minutes reviewing records and discussing these issues.   Gershon Crane, MD

## 2023-01-31 ENCOUNTER — Encounter: Payer: Self-pay | Admitting: Internal Medicine

## 2023-03-02 ENCOUNTER — Other Ambulatory Visit: Payer: Self-pay | Admitting: Family Medicine

## 2023-03-02 DIAGNOSIS — K219 Gastro-esophageal reflux disease without esophagitis: Secondary | ICD-10-CM

## 2023-03-02 DIAGNOSIS — I10 Essential (primary) hypertension: Secondary | ICD-10-CM

## 2023-03-02 DIAGNOSIS — M109 Gout, unspecified: Secondary | ICD-10-CM

## 2023-03-27 ENCOUNTER — Telehealth: Payer: Self-pay | Admitting: Family Medicine

## 2023-03-27 NOTE — Telephone Encounter (Signed)
Assisted Living form- ([facility]) to be filled out- placed in dr's folder.  Please call pt at [phone] upon completion for pick up.

## 2023-04-01 DIAGNOSIS — Z0279 Encounter for issue of other medical certificate: Secondary | ICD-10-CM

## 2023-04-04 NOTE — Telephone Encounter (Signed)
Pt form has been completed  and pt notified to pick up. Form placed in the front office filing cabinet

## 2023-04-18 ENCOUNTER — Other Ambulatory Visit: Payer: Self-pay | Admitting: Family Medicine

## 2023-05-07 ENCOUNTER — Encounter: Payer: Self-pay | Admitting: Internal Medicine

## 2023-05-07 ENCOUNTER — Other Ambulatory Visit (INDEPENDENT_AMBULATORY_CARE_PROVIDER_SITE_OTHER)

## 2023-05-07 ENCOUNTER — Ambulatory Visit: Payer: Medicare Other | Admitting: Internal Medicine

## 2023-05-07 ENCOUNTER — Ambulatory Visit (HOSPITAL_COMMUNITY)
Admission: RE | Admit: 2023-05-07 | Discharge: 2023-05-07 | Disposition: A | Discharge status: Home / Self Care | Source: Ambulatory Visit | Attending: Internal Medicine | Admitting: Internal Medicine

## 2023-05-07 VITALS — BP 128/70 | HR 61 | Ht 60.0 in | Wt 128.4 lb

## 2023-05-07 DIAGNOSIS — R140 Abdominal distension (gaseous): Secondary | ICD-10-CM

## 2023-05-07 DIAGNOSIS — R635 Abnormal weight gain: Secondary | ICD-10-CM

## 2023-05-07 DIAGNOSIS — K219 Gastro-esophageal reflux disease without esophagitis: Secondary | ICD-10-CM

## 2023-05-07 DIAGNOSIS — R197 Diarrhea, unspecified: Secondary | ICD-10-CM

## 2023-05-07 DIAGNOSIS — R188 Other ascites: Secondary | ICD-10-CM | POA: Insufficient documentation

## 2023-05-07 DIAGNOSIS — R1031 Right lower quadrant pain: Secondary | ICD-10-CM | POA: Insufficient documentation

## 2023-05-07 DIAGNOSIS — R935 Abnormal findings on diagnostic imaging of other abdominal regions, including retroperitoneum: Secondary | ICD-10-CM

## 2023-05-07 LAB — COMPREHENSIVE METABOLIC PANEL
ALT: 25 U/L (ref 0–35)
AST: 24 U/L (ref 0–37)
Albumin: 4.4 g/dL (ref 3.5–5.2)
Alkaline Phosphatase: 116 U/L (ref 39–117)
BUN: 22 mg/dL (ref 6–23)
CO2: 26 meq/L (ref 19–32)
Calcium: 9.4 mg/dL (ref 8.4–10.5)
Chloride: 97 meq/L (ref 96–112)
Creatinine, Ser: 0.94 mg/dL (ref 0.40–1.20)
GFR: 55.5 mL/min — ABNORMAL LOW (ref >60.00)
Glucose, Bld: 144 mg/dL — ABNORMAL HIGH (ref 70–99)
Potassium: 3.9 meq/L (ref 3.5–5.1)
Sodium: 134 meq/L — ABNORMAL LOW (ref 135–145)
Total Bilirubin: 0.3 mg/dL (ref 0.2–1.2)
Total Protein: 7.3 g/dL (ref 6.0–8.3)

## 2023-05-07 LAB — CBC WITH DIFFERENTIAL/PLATELET
Basophils Absolute: 0 10*3/uL (ref 0.0–0.1)
Basophils Relative: 0.8 % (ref 0.0–3.0)
Eosinophils Absolute: 0.1 10*3/uL (ref 0.0–0.7)
Eosinophils Relative: 1.1 % (ref 0.0–5.0)
HCT: 41.2 % (ref 36.0–46.0)
Hemoglobin: 13.9 g/dL (ref 12.0–15.0)
Lymphocytes Relative: 38.4 % (ref 12.0–46.0)
Lymphs Abs: 1.9 10*3/uL (ref 0.7–4.0)
MCHC: 33.7 g/dL (ref 30.0–36.0)
MCV: 91.5 fl (ref 78.0–100.0)
Monocytes Absolute: 0.5 10*3/uL (ref 0.1–1.0)
Monocytes Relative: 10 % (ref 3.0–12.0)
Neutro Abs: 2.4 10*3/uL (ref 1.4–7.7)
Neutrophils Relative %: 49.7 % (ref 43.0–77.0)
Platelets: 291 10*3/uL (ref 150.0–400.0)
RBC: 4.5 Mil/uL (ref 3.87–5.11)
RDW: 13.5 % (ref 11.5–15.5)
WBC: 4.8 10*3/uL (ref 4.0–10.5)

## 2023-05-07 MED ORDER — IOHEXOL 9 MG/ML PO SOLN: 500.0000 mL | ORAL | Status: DC

## 2023-05-07 MED ORDER — IOHEXOL 300 MG/ML  SOLN
100.0000 mL | Freq: Once | INTRAMUSCULAR | Status: AC | PRN
  Administered 2023-05-07: 100 mL via INTRAVENOUS

## 2023-05-07 MED ORDER — IOHEXOL 9 MG/ML PO SOLN
1000.0000 mL | ORAL | Status: AC
  Administered 2023-05-07: 1000 mL via ORAL

## 2023-05-07 NOTE — Patient Instructions (Signed)
 Your provider has requested that you go to the basement level for lab work before leaving today. Press "B" on the elevator. The lab is located at the first door on the left as you exit the elevator.  Please do not take any medication for now.  You have been scheduled for a CT scan of the abdomen and pelvis at [facility], 1st floor Radiology. You are scheduled on 05/07/23 at 2:00pm, arriving at 11:45 am.   Please follow the written instructions below on the day of your exam:   1) Do not eat anything after 10:00am (4 hours prior to your test)   You may take any medications as prescribed with a small amount of water, if necessary. If you take any of the following medications: METFORMIN, GLUCOPHAGE, GLUCOVANCE, AVANDAMET, RIOMET, FORTAMET, ACTOPLUS MET, JANUMET, GLUMETZA or METAGLIP, you MAY be asked to HOLD this medication 48 hours AFTER the exam.   The purpose of you drinking the oral contrast is to aid in the visualization of your intestinal tract. The contrast solution may cause some diarrhea. Depending on your individual set of symptoms, you may also receive an intravenous injection of x-ray contrast/dye. Plan on being at [facility] for 45 minutes or longer, depending on the type of exam you are having performed.   If you have any questions regarding your exam or if you need to reschedule, you may call [facility] at [phone] between the hours of 8:00 am and 5:00 pm, Monday-Friday.   _______________________________________________________  If your blood pressure at your visit was 140/90 or greater, please contact your primary care physician to follow up on this.  _______________________________________________________  If you are age 55 or older, your body mass index should be between 23-30. Your Body mass index is 25.07 kg/m. If this is out of the aforementioned range listed, please consider follow up with your Primary Care Provider.  If you are age 41 or younger,  your body mass index should be between 19-25. Your Body mass index is 25.07 kg/m. If this is out of the aformentioned range listed, please consider follow up with your Primary Care Provider.   ________________________________________________________  The [facility] providers would like to encourage you to use [facility] to communicate with providers for non-urgent requests or questions.  Due to long hold times on the telephone, sending your provider a message by [facility] may be a faster and more efficient way to get a response.  Please allow 48 business hours for a response.  Please remember that this is for non-urgent requests.  _______________________________________________________

## 2023-05-07 NOTE — Progress Notes (Signed)
 Patient ID: Jennifer Green, female   DOB: 1940-12-05, 83 y.o.   MRN: [id number] HPI: Jennifer Green is an 83 year old female with a history of PVCs, hypertension, gout, arthritis, previous surgical fusion surgery who presents with right lower quadrant abdominal pain and rapid weight gain.  She is here today with her daughter.  She experiences excruciating pain in the right groin area, particularly at night after lying down for about two hours. The pain is described as making her feel like she needs to urinate, but she is unable to pass any urine. The pain improves when she gets up and moves around, but returns when she lies back down. She also notes a sensation of bloating, likening it to 'holding an eight-pound baby.'  She has experienced a weight gain of thirty pounds over the past three months, despite not having a significant increase in appetite. Her clothes from six months ago no longer fit, and she had to donate them. The weight gain is accompanied by a feeling of abdominal distention, particularly around the right flank, where she describes a 'very sore, very painful ball' and a 'massy thing.'  She experiences diarrhea two to three times a week, which starts loose and becomes 'oatmeal-like.' She does not feel fully emptied after bowel movements and sometimes has to return to the bathroom shortly after leaving. No blood or black, tarry stools.  She takes omeprazole (Prilosec) for reflux, which she has been on for a long time. She experiences reflux occasionally, particularly after consuming ice cream or milkshakes, which never bothered her before.  She has a history of back problems and was told that her vertebrae were collapsing on each other. She has had back surgery in the past and was advised to consider another surgery, but she is not currently pursuing this option.  She reports swelling in her feet, which is not worse than usual. No blood in her stools and no black or tarry stools. She  experiences reflux occasionally, but not on a daily basis.  Her appetite remains good and she is able to eat well without nausea or vomiting  She did have a CT scan and MRI last year see below   Past Medical History:  Diagnosis Date   Allergy    Anemia    Arthritis    Dyspnea    Elevated blood sugar    790.21   GERD (gastroesophageal reflux disease)    Glaucoma    Gout    Hypertension    Hypokalemia    Hypomagnesemia    Insomnia    Murmur    OP (osteoporosis)    PVC (premature ventricular contraction)     Past Surgical History:  Procedure Laterality Date   ANTERIOR CERVICAL DECOMP/DISCECTOMY FUSION N/A 04/28/2017   Procedure: ANTERIOR CERVICAL DECOMPRESSION/DISCECTOMY FUSION, INTERBODY PROSTHESIS CERVICAL SIX- CERVICAL SEVEN, EXPLORE FUSION, REMOVAL OF OLD SKYLINE PLATE CERVICAL FIVE- CERVICAL SIX;  Surgeon: Tressie Stalker, MD;  Location: [facility] OR;  Service: Neurosurgery;  Laterality: N/A;   BACK SURGERY     CERVICAL FUSION  05/2010   per Dr. Tressie Stalker    EYE SURGERY     right and left    Outpatient Medications Prior to Visit  Medication Sig Dispense Refill   allopurinol (ZYLOPRIM) 300 MG tablet TAKE ONE TABLET BY MOUTH ONE TIME DAILY 90 tablet 0   amLODipine (NORVASC) 10 MG tablet TAKE ONE TABLET BY MOUTH ONE TIME DAILY 90 tablet 0   Ascorbic Acid (VITAMIN C) 1000  MG tablet Take 1,000 mg by mouth daily.     atenolol (TENORMIN) 25 MG tablet TAKE ONE TABLET BY MOUTH ONE TIME DAILY 90 tablet 0   Calcium Carb-Cholecalciferol (CALCIUM 600+D3 PO) Take 1 tablet by mouth daily.     diazepam (VALIUM) 10 MG tablet TAKE ONE TABLET BY MOUTH AT BEDTIME AS NEEDED FOR ANXIETY OR SLEEP 30 tablet 5   diclofenac (VOLTAREN) 75 MG EC tablet Take 1 tablet (75 mg total) by mouth 2 (two) times daily. 180 tablet 3   dorzolamide-timolol (COSOPT) 22.3-6.8 MG/ML ophthalmic solution Place 1 drop into both eyes 2 (two) times daily.     gabapentin (NEURONTIN) 100 MG capsule Take 1 capsule (100  mg total) by mouth 3 (three) times daily. 90 capsule 3   hydrochlorothiazide (HYDRODIURIL) 25 MG tablet Take 1 tablet (25 mg total) by mouth daily. 90 tablet 3   levothyroxine (SYNTHROID) 50 MCG tablet TAKE ONE TABLET BY MOUTH ONE TIME DAILY 90 tablet 0   Magnesium 400 MG CAPS Take 400 mg by mouth every other day. In the morning     Netarsudil-Latanoprost (ROCKLATAN) 0.02-0.005 % SOLN Place 1 drop into both eyes nightly.     omeprazole (PRILOSEC) 40 MG capsule TAKE ONE CAPSULE BY MOUTH ONE TIME DAILY 90 capsule 0   pilocarpine (PILOCAR) 4 % ophthalmic solution Place 1 drop into both eyes 2 (two) times daily.     No facility-administered medications prior to visit.    Allergies  Allergen Reactions   Indomethacin     Rapid Heart Beat   Promethazine Hcl Other (See Comments)    Couldn't stay still   Trazodone And Nefazodone Other (See Comments)    Hallucinations     Family History  Problem Relation Age of Onset   Pneumonia Mother    Hypertension Mother    Breast cancer Mother    Heart disease Father     Social History   Tobacco Use   Smoking status: Never   Smokeless tobacco: Never  Vaping Use   Vaping status: Never Used  Substance Use Topics   Alcohol use: No   Drug use: No    ROS: As per history of present illness, otherwise negative  BP 128/70   Pulse 61   Ht 5' (1.524 m)   Wt 128 lb 6 oz (58.2 kg)   BMI 25.07 kg/m  Gen: awake, alert, NAD HEENT: anicteric  CV: RRR, no mrg Pulm: CTA b/l Abd: soft, distended with probable ascites, bilateral flanks are bulging slightly, tender in the right and lower abdomen, no palpable masses,  +BS throughout Ext: no c/c/e Neuro: nonfocal   RELEVANT LABS AND IMAGING: CBC    Component Value Date/Time   WBC 4.8 05/07/2023 1100   RBC 4.50 05/07/2023 1100   HGB 13.9 05/07/2023 1100   HCT 41.2 05/07/2023 1100   PLT 291.0 05/07/2023 1100   MCV 91.5 05/07/2023 1100   MCH 30.4 04/22/2017 1013   MCHC 33.7 05/07/2023 1100    RDW 13.5 05/07/2023 1100   LYMPHSABS 1.9 05/07/2023 1100   MONOABS 0.5 05/07/2023 1100   EOSABS 0.1 05/07/2023 1100   BASOSABS 0.0 05/07/2023 1100    CMP     Component Value Date/Time   NA 134 (L) 05/07/2023 1100   K 3.9 05/07/2023 1100   CL 97 05/07/2023 1100   CO2 26 05/07/2023 1100   GLUCOSE 144 (H) 05/07/2023 1100   BUN 22 05/07/2023 1100   CREATININE 0.94 05/07/2023 1100  CALCIUM 9.4 05/07/2023 1100   PROT 7.3 05/07/2023 1100   ALBUMIN 4.4 05/07/2023 1100   AST 24 05/07/2023 1100   ALT 25 05/07/2023 1100   ALKPHOS 116 05/07/2023 1100   BILITOT 0.3 05/07/2023 1100   GFRNONAA 59 (L) 04/22/2017 1013   GFRAA >60 04/22/2017 1013   CT ABDOMEN AND PELVIS WITH CONTRAST   TECHNIQUE: Multidetector CT imaging of the abdomen and pelvis was performed using the standard protocol following bolus administration of intravenous contrast.   RADIATION DOSE REDUCTION: This exam was performed according to the departmental dose-optimization program which includes automated exposure control, adjustment of the mA and/or kV according to patient size and/or use of iterative reconstruction technique.   CONTRAST:  ISOVUE-300 IOPAMIDOL (ISOVUE-300) INJECTION 61%   COMPARISON:  Limited correlation made with chest CT 03/22/2011 and 12/27/2010   FINDINGS: Lower chest: Images through the lung bases are motion degraded. There is mild linear bibasilar atelectasis or scarring. No confluent airspace disease, pleural or pericardial effusion. The heart is enlarged, and there is atherosclerosis of the aorta and coronary arteries.   Hepatobiliary: Diffuse hepatic steatosis. There is a uniformly hyperdense lesion posteriorly in the right hepatic lobe (segment 7), measuring 2.3 x 1.8 cm on image 23/2, not clearly seen on remote chest CTs. No other focal liver lesions are identified. There is mild spurring of steatosis around the gallbladder. There are small gallstones. No evidence of  gallbladder wall thickening, surrounding inflammation or biliary ductal dilatation.   Pancreas: Diffuse fatty replacement of the pancreas. No focal abnormality, surrounding inflammation or ductal dilatation.   Spleen: Normal in size without focal abnormality.   Adrenals/Urinary Tract: 1.2 cm left adrenal nodule on image 23/2 has a nonspecific density, although is unchanged in size from remote chest CT, consistent with an incidental adenoma; no specific follow-up imaging recommended. The right adrenal gland appears normal. No evidence of urinary tract calculus, suspicious renal lesion or hydronephrosis. Simple appearing renal cysts bilaterally measuring up to 3.4 cm in the interpolar region of the right kidney, for which no specific follow-up imaging recommended. The urinary bladder appears unremarkable for its degree of distention.   Stomach/Bowel: Enteric contrast has passed into the distal colon. The stomach appears unremarkable for its degree of distension. No evidence of bowel wall thickening, distention or surrounding inflammatory change. There is a normal appearing retrocecal appendix.   Vascular/Lymphatic: There are no enlarged abdominal or pelvic lymph nodes. Scattered small retroperitoneal and pelvic lymph nodes are not pathologically enlarged. Aortic and branch vessel atherosclerosis without evidence of aneurysm or large vessel occlusion.   Reproductive: Lobulated 1.6 cm calcification along the right aspect of the uterus on image 73/2, likely a degenerated fibroid. The endometrium appears thickened for age, measuring 10 mm on sagittal image 80/8. No other suspicious uterine or adnexal findings.   Other: No evidence of abdominal wall mass or hernia. No ascites or pneumoperitoneum.   Musculoskeletal: No acute or significant osseous findings. Multilevel spondylosis associated with a convex left lumbar scoliosis.   Unless specific follow-up recommendations are  mentioned in the findings or impression sections, no imaging follow-up of any mentioned incidental findings is recommended.   IMPRESSION: 1. No acute findings or explanation for the patient's symptoms. No evidence of appendicitis or bowel obstruction. 2. Cholelithiasis without evidence of cholecystitis or biliary ductal dilatation. 3. Indeterminate 2.3 cm hyperdense lesion posteriorly in the right hepatic lobe, not clearly seen on remote chest CT's. This could reflect a flash filling hemangioma. Recommend further evaluation with  nonemergent abdominal MRI without and with contrast. 4. Endometrial thickening for age. Recommend further evaluation with pelvic ultrasound (especially if there is abnormal vaginal bleeding). 5.  Aortic Atherosclerosis (ICD10-I70.0).     Electronically Signed   By: Carey Bullocks M.D.   On: 11/27/2022 11:37  MRI ABDOMEN WITHOUT AND WITH CONTRAST   TECHNIQUE: Multiplanar multisequence MR imaging of the abdomen was performed both before and after the administration of intravenous contrast.   CONTRAST:  6 mL Vueway gadolinium contrast IV   COMPARISON:  CT abdomen pelvis, 11/12/2022   FINDINGS: Lower chest: No acute abnormality.   Hepatobiliary: Severe hepatic steatosis. Vividly enhancing lesion of the posterior right lobe of the liver, hepatic segment VII, measuring 2.6 x 2.1 cm (series 12, image 32). This demonstrates minimal underlying intrinsic T2 hyperintensity and fades towards parenchymal background on remaining multiphasic sequences without evidence of washout nor capsular enhancement. Gallstones. No gallbladder wall thickening, or biliary dilatation.   Pancreas: Unremarkable. No pancreatic ductal dilatation or surrounding inflammatory changes.   Spleen: Normal in size without significant abnormality.   Adrenals/Urinary Tract: Adrenal glands are unremarkable. Simple, benign bilateral renal cortical cysts, for which no further follow-up  or characterization is required. Kidneys are otherwise normal, without renal calculi, solid lesion, or hydronephrosis.   Stomach/Bowel: Stomach is within normal limits. No evidence of bowel wall thickening, distention, or inflammatory changes.   Vascular/Lymphatic: No significant vascular findings are present. No enlarged abdominal lymph nodes.   Other: No abdominal wall hernia or abnormality. No ascites.   Musculoskeletal: No acute or significant osseous findings.   IMPRESSION: 1. Vividly enhancing lesion of the posterior right lobe of the liver, hepatic segment VII, measuring 2.6 x 2.1 cm. This demonstrates minimal underlying intrinsic T2 hyperintensity and fades towards parenchymal background on remaining multiphasic sequences without evidence of washout nor capsular enhancement. 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 Consider follow-up in 6 months to ensure stability. If there is known chronic high risk liver disease (cirrhosis, chronic viral hepatitis), this is a LI-RADS category 3 lesion, intermediate suspicion for hepatocellular carcinoma, and follow-up in 6 months is recommended. 2. Severe hepatic steatosis. 3. Cholelithiasis.     Electronically Signed   By: Jearld Lesch M.D.   On: 01/11/2023 14:20  ASSESSMENT/PLAN: 83 year old female with a history of PVCs, hypertension, gout, arthritis, previous surgical fusion surgery who presents with right lower quadrant abdominal pain and rapid weight gain.   Right lower quadrant and lower abdominal pain with distention Excruciating right lower quadrant pain with distention, possibly due to ascites, mass, or fluid collection. Previous imaging showed gallstones and liver hemangioma, not felt linked to current symptoms. CT scan prioritized to assess for ascites or other pathology. - Order stat CT abdomen and pelvis with  contrast to evaluate for ascites or other abdominal pathology. - Perform CBC and CMP to assess for underlying causes of symptoms. - Avoid starting new medications until further diagnostic information is available. - If ascites will need at least dx paracentesis for fluid testing and cytology -I am concerned about possible uterine pathology given thickened endometrial stripe and possibly no ascites; patient's daughter noted that previously she declined and wished to avoid a transvaginal ultrasound.  She has not seen gynecology in many years.  Diarrhea Diarrhea two to three times a week, unclear etiology.  Need to determine if she has ascites before further addressing bowel issues.  The bowel has not looked  abnormal on imaging.  May need stool studies but starting with CT scan.  Remote colonoscopies.  Reflux disease Reflux disease managed with omeprazole, occasional heartburn and reflux after certain foods. -Continue omeprazole at current dose 40 mg daily  Follow-up Emphasized importance of timely follow-up on CT scan results to determine cause of abdominal symptoms and guide further management. - Perform lab work (CBC and CMP) in the office before she leaves.  Cc:Fry, Tera Mater, Md [address] [place],  [place] [address]

## 2023-05-09 ENCOUNTER — Other Ambulatory Visit: Payer: Self-pay

## 2023-05-09 DIAGNOSIS — S32050A Wedge compression fracture of fifth lumbar vertebra, initial encounter for closed fracture: Secondary | ICD-10-CM

## 2023-05-09 DIAGNOSIS — R1031 Right lower quadrant pain: Secondary | ICD-10-CM

## 2023-05-09 DIAGNOSIS — R935 Abnormal findings on diagnostic imaging of other abdominal regions, including retroperitoneum: Secondary | ICD-10-CM

## 2023-05-09 DIAGNOSIS — R197 Diarrhea, unspecified: Secondary | ICD-10-CM

## 2023-05-12 NOTE — Telephone Encounter (Signed)
 I see  recent CT scan ordered by Dr. Rhea Belton showed an L5 compression fracture. I have referred her to Orthopedics to treat this.

## 2023-05-14 ENCOUNTER — Encounter: Payer: Self-pay | Admitting: Physician Assistant

## 2023-05-14 ENCOUNTER — Other Ambulatory Visit (INDEPENDENT_AMBULATORY_CARE_PROVIDER_SITE_OTHER): Payer: Self-pay

## 2023-05-14 ENCOUNTER — Ambulatory Visit (INDEPENDENT_AMBULATORY_CARE_PROVIDER_SITE_OTHER): Admitting: Physician Assistant

## 2023-05-14 DIAGNOSIS — G8929 Other chronic pain: Secondary | ICD-10-CM | POA: Diagnosis not present

## 2023-05-14 DIAGNOSIS — M545 Low back pain, unspecified: Secondary | ICD-10-CM

## 2023-05-14 DIAGNOSIS — M8000XA Age-related osteoporosis with current pathological fracture, unspecified site, initial encounter for fracture: Secondary | ICD-10-CM

## 2023-05-14 NOTE — Progress Notes (Signed)
 Office Visit Note   Patient: Jennifer Green           Date of Birth: 16-Jun-1940           MRN: [id number] Visit Date: 05/14/2023              Requested by: Nelwyn Salisbury, MD [address] [place],  [place] [address] PCP: Nelwyn Salisbury, MD   Assessment & Plan: Visit Diagnoses:  1. Chronic bilateral low back pain without sciatica     Plan: Jennifer Green is a pleasant active 83 year old woman who comes in today for evaluation of low back pain.  She says she thinks has been going on for about a year.  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  Focally the pain is gotten more intense she does have pain more with extension over the top of the belt line.  She does not really have any radicular findings.  She is recently moved into a senior focused complex and she is looking into doing some strengthening classes this would be easier for her right now than going to PT I think that be great of note she has not had a bone density scan in 2 years.  2 years she had a bone density of -2.6.  I asked her if she discussed with her primary care who had ordered it if what she might be eligible medication wise she said he did not discuss it with her.  I would like for her to get a new bone density scan and then follow-up in my osteoporosis clinic given her level of function I think she would benefit from treatment  Follow-Up Instructions: After bone density scan  Orders:  Orders Placed This Encounter  Procedures   XR Lumbar Spine 2-3 Views   No orders of the defined types were placed in this encounter.     Procedures: No procedures performed   Clinical Data: No additional findings.   Subjective: No chief complaint on file.   HPI Patient is a pleasant 83 year old woman who presents today with a history of low back pain..  She describes pain in her lower back.  She has been taking over-the-counter medication she was lifting lifting some heavy boxes which is started  hurting other than that.  She had back surgery 1 year ago Review of Systems  All other systems reviewed and are negative.    Objective: Vital Signs: There were no vitals taken for this visit.  Physical Exam Constitutional:      Appearance: Normal appearance.  Pulmonary:     Effort: Pulmonary effort is normal.  Skin:    General: Skin is warm and dry.  Neurological:     Mental Status: She is alert.  Psychiatric:        Mood and Affect: Mood normal.        Behavior: Behavior normal.     Ortho Exam Examination of her low back she has no step-offs no crepitus.  Negative straight leg raise bilaterally she has good strength with flexion extension of her ankles extension and flexion of her legs sensation is intact tenderness with extension of her back feels better with flexion. Specialty Comments:  No specialty comments available.  Imaging: No results found.   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   Dyspnea and respiratory abnormality 02/25/2014   Ataxia 08/12/2013   Peripheral edema 08/12/2013   Meningioma (HCC) 04/29/2013   Hearing loss 03/23/2013   PVC's (premature ventricular contractions) 07/15/2011   Coronary artery calcification seen on CAT scan 03/28/2011   Lung nodule 12/31/2010   Chronic cough 12/31/2010   Hypokalemia 12/05/2010   Irregular heart rate 12/05/2010   Insomnia 02/16/2009   DISC DISEASE, CERVICAL 03/24/2007   GOUT 07/11/2006   Essential hypertension 07/11/2006   ALLERGIC  RHINITIS 07/11/2006   GERD 07/11/2006   Past Medical History:  Diagnosis Date   Allergy    Anemia    Arthritis    Dyspnea    Elevated blood sugar    790.21   GERD (gastroesophageal reflux disease)    Glaucoma    Gout    Hypertension    Hypokalemia    Hypomagnesemia    Insomnia    Murmur    OP (osteoporosis)    PVC (premature ventricular contraction)     Family History  Problem Relation Age of Onset   Pneumonia Mother    Hypertension Mother    Breast cancer Mother    Heart disease Father     Past Surgical History:  Procedure Laterality Date   ANTERIOR CERVICAL DECOMP/DISCECTOMY FUSION N/A 04/28/2017   Procedure: ANTERIOR CERVICAL DECOMPRESSION/DISCECTOMY FUSION, INTERBODY PROSTHESIS CERVICAL SIX- CERVICAL SEVEN, EXPLORE FUSION, REMOVAL OF OLD SKYLINE PLATE CERVICAL FIVE- CERVICAL SIX;  Surgeon: Tressie Stalker, MD;  Location: [facility] OR;  Service: Neurosurgery;  Laterality: N/A;   BACK SURGERY     CERVICAL FUSION  05/2010   per Dr. Tressie Stalker    EYE SURGERY     right and left   Social History   Occupational History   Occupation: retired    Comment: Airline pilot  Tobacco Use   Smoking status: Never   Smokeless tobacco: Never  Vaping Use   Vaping status: Never Used  Substance and Sexual Activity   Alcohol use: No   Drug use: No   Sexual activity: Never

## 2023-05-15 ENCOUNTER — Other Ambulatory Visit

## 2023-05-15 ENCOUNTER — Other Ambulatory Visit: Payer: Self-pay | Admitting: Physician Assistant

## 2023-05-15 DIAGNOSIS — M810 Age-related osteoporosis without current pathological fracture: Secondary | ICD-10-CM

## 2023-05-15 DIAGNOSIS — M8000XA Age-related osteoporosis with current pathological fracture, unspecified site, initial encounter for fracture: Secondary | ICD-10-CM

## 2023-05-19 ENCOUNTER — Other Ambulatory Visit

## 2023-05-19 DIAGNOSIS — R197 Diarrhea, unspecified: Secondary | ICD-10-CM

## 2023-05-19 DIAGNOSIS — R1031 Right lower quadrant pain: Secondary | ICD-10-CM

## 2023-05-19 DIAGNOSIS — R935 Abnormal findings on diagnostic imaging of other abdominal regions, including retroperitoneum: Secondary | ICD-10-CM

## 2023-05-22 LAB — SPECIMEN STATUS REPORT

## 2023-05-22 LAB — CLOSTRIDIUM DIFFICILE BY PCR

## 2023-05-23 LAB — OVA AND PARASITE EXAMINATION
CONCENTRATE RESULT:: NONE SEEN
MICRO NUMBER:: 16296919
SPECIMEN QUALITY:: ADEQUATE
TRICHROME RESULT:: NONE SEEN

## 2023-05-23 LAB — CLOSTRIDIUM DIFFICILE BY PCR

## 2023-05-23 LAB — CALPROTECTIN, FECAL: Calprotectin, Fecal: 79 ug/g (ref 0–120)

## 2023-05-23 LAB — SPECIMEN STATUS REPORT

## 2023-05-24 LAB — STOOL CULTURE: E coli, Shiga toxin Assay: NEGATIVE

## 2023-05-26 LAB — PANCREATIC ELASTASE, FECAL: Pancreatic Elastase-1, Stool: 683 ug/g (ref >200)

## 2023-05-27 ENCOUNTER — Telehealth: Payer: Self-pay | Admitting: Internal Medicine

## 2023-05-27 NOTE — Telephone Encounter (Signed)
 Inbound call from patient requesting a call back to discuss her CT results and her lab results. Please advise.

## 2023-05-27 NOTE — Telephone Encounter (Signed)
 Patient calls and requests to go over recent stool studies and CT scan. She says she was never contacted about results and does not understand what they mean. We discussed that stool studies was all negative/normal. Also discussed that CT did not show any large abnormalities or malignancies. Advised that she does have gallstones, although these are not inflamed and should not be the cause of any discomfort. Explained that liver lesion is stable from last year, though it does appear she may have fatty liver. Patient is informed of L5 compression fracture which could be causing flank pain/groin pain and should be addressed with PCP. Patient verbalizes understanding and denies any additional questions at this time.

## 2023-06-02 ENCOUNTER — Other Ambulatory Visit: Payer: Self-pay | Admitting: Family Medicine

## 2023-06-02 DIAGNOSIS — I10 Essential (primary) hypertension: Secondary | ICD-10-CM

## 2023-06-02 DIAGNOSIS — K219 Gastro-esophageal reflux disease without esophagitis: Secondary | ICD-10-CM

## 2023-06-04 ENCOUNTER — Ambulatory Visit (HOSPITAL_BASED_OUTPATIENT_CLINIC_OR_DEPARTMENT_OTHER)
Admission: RE | Admit: 2023-06-04 | Discharge: 2023-06-04 | Disposition: A | Discharge status: Home / Self Care | Source: Ambulatory Visit | Attending: Physician Assistant | Admitting: Physician Assistant

## 2023-06-04 DIAGNOSIS — M810 Age-related osteoporosis without current pathological fracture: Secondary | ICD-10-CM | POA: Diagnosis present

## 2023-06-04 DIAGNOSIS — M8000XA Age-related osteoporosis with current pathological fracture, unspecified site, initial encounter for fracture: Secondary | ICD-10-CM | POA: Diagnosis present

## 2023-07-08 NOTE — Progress Notes (Unsigned)
 Jennifer Green Date of Birth: 09/28/1940 Medical Record #[id number]  History of Present Illness: Jennifer Green is seen today for followup of PVCs. Extensive evaluation in October 2012 including echocardiogram and stress Myoview  study were normal. She did have coronary calcification noted on chest CT in 2/13. No history of angina.   She states she is doing very well from a cardiac standpoint. She has rare palpitations. BP is well controlled. Rare indigestion pain.  No syncope. She did have myalgias on Crestor  and is no longer taking. Notes her husband passed in Dec - had dementia.    Current Outpatient Medications on File Prior to Visit  Medication Sig Dispense Refill   allopurinol  (ZYLOPRIM ) 300 MG tablet TAKE ONE TABLET BY MOUTH ONE TIME DAILY 90 tablet 0   amLODipine  (NORVASC ) 10 MG tablet TAKE ONE TABLET BY MOUTH ONE TIME DAILY 90 tablet 0   Ascorbic Acid (VITAMIN C) 1000 MG tablet Take 1,000 mg by mouth daily.     atenolol  (TENORMIN ) 25 MG tablet TAKE ONE TABLET BY MOUTH ONE TIME DAILY 90 tablet 0   Calcium  Carb-Cholecalciferol (CALCIUM  600+D3 PO) Take 1 tablet by mouth daily.     diazepam  (VALIUM ) 10 MG tablet TAKE ONE TABLET BY MOUTH AT BEDTIME AS NEEDED FOR ANXIETY OR SLEEP 30 tablet 5   diclofenac  (VOLTAREN ) 75 MG EC tablet Take 1 tablet (75 mg total) by mouth 2 (two) times daily. 180 tablet 3   dorzolamide -timolol  (COSOPT ) 22.3-6.8 MG/ML ophthalmic solution Place 1 drop into both eyes 2 (two) times daily.     gabapentin  (NEURONTIN ) 100 MG capsule Take 1 capsule (100 mg total) by mouth 3 (three) times daily. 90 capsule 3   hydrochlorothiazide  (HYDRODIURIL ) 25 MG tablet Take 1 tablet (25 mg total) by mouth daily. 90 tablet 3   levothyroxine  (SYNTHROID ) 50 MCG tablet TAKE ONE TABLET BY MOUTH ONE TIME DAILY 90 tablet 0   Magnesium  400 MG CAPS Take 400 mg by mouth every other day. In the morning     Netarsudil-Latanoprost  (ROCKLATAN) 0.02-0.005 % SOLN Place 1 drop into both eyes nightly.      omeprazole  (PRILOSEC) 40 MG capsule TAKE ONE CAPSULE BY MOUTH ONE TIME DAILY 90 capsule 0   pilocarpine  (PILOCAR) 4 % ophthalmic solution Place 1 drop into both eyes 2 (two) times daily.     No current facility-administered medications on file prior to visit.    Allergies  Allergen Reactions   Indomethacin     Rapid Heart Beat   Promethazine Hcl Other (See Comments)    Couldn't stay still   Trazodone  And Nefazodone Other (See Comments)    Hallucinations     Past Medical History:  Diagnosis Date   Allergy    Anemia    Arthritis    Dyspnea    Elevated blood sugar    790.21   GERD (gastroesophageal reflux disease)    Glaucoma    Gout    Hypertension    Hypokalemia    Hypomagnesemia    Insomnia    Murmur    OP (osteoporosis)    PVC (premature ventricular contraction)     Past Surgical History:  Procedure Laterality Date   ANTERIOR CERVICAL DECOMP/DISCECTOMY FUSION N/A 04/28/2017   Procedure: ANTERIOR CERVICAL DECOMPRESSION/DISCECTOMY FUSION, INTERBODY PROSTHESIS CERVICAL SIX- CERVICAL SEVEN, EXPLORE FUSION, REMOVAL OF OLD SKYLINE PLATE CERVICAL FIVE- CERVICAL SIX;  Surgeon: Garry Kansas, MD;  Location: [facility] OR;  Service: Neurosurgery;  Laterality: N/A;   BACK SURGERY  CERVICAL FUSION  05/2010   per Dr. Garry Kansas    EYE SURGERY     right and left    Social History   Tobacco Use  Smoking Status Never  Smokeless Tobacco Never    Social History   Substance and Sexual Activity  Alcohol Use No    Family History  Problem Relation Age of Onset   Pneumonia Mother    Hypertension Mother    Breast cancer Mother    Heart disease Father     Review of Systems: As noted in history of present illness..  All other systems were reviewed and are negative.  Physical Exam: There were no vitals taken for this visit.  GENERAL:  Well appearing WF in NAD HEENT:  PERRL, EOMI, sclera are clear. Oropharynx is clear. NECK:  No jugular venous distention, carotid  upstroke brisk and symmetric, no bruits, no thyromegaly or adenopathy LUNGS:  Clear to auscultation bilaterally CHEST:  Unremarkable HEART:  RRR,  PMI not displaced or sustained,S1 and S2 within normal limits, no S3, no S4: no clicks, no rubs, soft 1/6 systolic murmur ABD:  Soft, nontender. BS +, no masses or bruits. No hepatomegaly, no splenomegaly EXT:  2 + pulses throughout, no edema, no cyanosis no clubbing SKIN:  Warm and dry.  No rashes NEURO:  Alert and oriented x 3. Cranial nerves II through XII intact. PSYCH:  Cognitively intact    LABORATORY DATA:   Lab Results  Component Value Date   WBC 4.8 05/07/2023   HGB 13.9 05/07/2023   HCT 41.2 05/07/2023   PLT 291.0 05/07/2023   GLUCOSE 144 (H) 05/07/2023   CHOL 167 07/04/2022   TRIG 187.0 (H) 07/04/2022   HDL 37.90 (L) 07/04/2022   LDLDIRECT 121.0 04/08/2019   LDLCALC 92 07/04/2022   ALT 25 05/07/2023   AST 24 05/07/2023   NA 134 (L) 05/07/2023   K 3.9 05/07/2023   CL 97 05/07/2023   CREATININE 0.94 05/07/2023   BUN 22 05/07/2023   CO2 26 05/07/2023   TSH 1.20 07/04/2022   HGBA1C 6.1 07/04/2022   Ecg today shows NSR with PACs rate 75 . Low voltage.    I have personally reviewed and interpreted this study.   Assessment / Plan: 1. PVCs/PACs chronic and benign based on prior cardiac work up. Continue beta blocker therapy.   2. Coronary calcification with normal myoview  study 2012. No symptoms of angina. Recommend risk factor modification.  3. HTN now well controlled   Follow up in one year.

## 2023-07-11 ENCOUNTER — Encounter: Payer: Self-pay | Admitting: Cardiology

## 2023-07-11 ENCOUNTER — Encounter: Payer: Self-pay | Admitting: Family Medicine

## 2023-07-11 ENCOUNTER — Ambulatory Visit: Attending: Cardiology | Admitting: Cardiology

## 2023-07-11 ENCOUNTER — Encounter: Payer: Self-pay | Admitting: Internal Medicine

## 2023-07-11 VITALS — BP 140/80 | HR 57 | Ht 60.0 in | Wt 136.0 lb

## 2023-07-11 DIAGNOSIS — I10 Essential (primary) hypertension: Secondary | ICD-10-CM | POA: Diagnosis present

## 2023-07-11 DIAGNOSIS — R600 Localized edema: Secondary | ICD-10-CM | POA: Insufficient documentation

## 2023-07-11 DIAGNOSIS — E039 Hypothyroidism, unspecified: Secondary | ICD-10-CM

## 2023-07-11 DIAGNOSIS — R0602 Shortness of breath: Secondary | ICD-10-CM | POA: Insufficient documentation

## 2023-07-11 DIAGNOSIS — R739 Hyperglycemia, unspecified: Secondary | ICD-10-CM

## 2023-07-11 NOTE — Patient Instructions (Signed)
 Medication Instructions:  Continue same medications *If you need a refill on your cardiac medications before your next appointment, please call your pharmacy*  Lab Work: None ordered  Testing/Procedures: Echo  first available   Follow-Up: At [facility], you and your health needs are our priority.  As part of our continuing mission to provide you with exceptional heart care, our providers are all part of one team.  This team includes your primary Cardiologist (physician) and Advanced Practice Providers or APPs (Physician Assistants and Nurse Practitioners) who all work together to provide you with the care you need, when you need it.  Your next appointment:  1 year   Call in Feb to schedule May appointment     Provider:  Dr.Jordan    We recommend signing up for the patient portal called "MyChart".  Sign up information is provided on this After Visit Summary.  MyChart is used to connect with patients for Virtual Visits (Telemedicine).  Patients are able to view lab/test results, encounter notes, upcoming appointments, etc.  Non-urgent messages can be sent to your provider as well.   To learn more about what you can do with MyChart, go to [email or web address].

## 2023-07-13 ENCOUNTER — Other Ambulatory Visit: Payer: Self-pay | Admitting: Family Medicine

## 2023-07-14 ENCOUNTER — Other Ambulatory Visit: Payer: Self-pay

## 2023-07-14 ENCOUNTER — Ambulatory Visit (INDEPENDENT_AMBULATORY_CARE_PROVIDER_SITE_OTHER): Payer: Medicare Other

## 2023-07-14 VITALS — BP 118/62 | HR 62 | Temp 98.2°F | Ht 60.0 in | Wt 134.3 lb

## 2023-07-14 DIAGNOSIS — Z0000 Encounter for general adult medical examination without abnormal findings: Secondary | ICD-10-CM

## 2023-07-14 DIAGNOSIS — R188 Other ascites: Secondary | ICD-10-CM

## 2023-07-14 NOTE — Telephone Encounter (Signed)
 Please let pt's daughter know we did not see ascites or fluid by CT We can attempt US  and paracentesis if fluid is present -- if so then send for cytology, cell count, albumin  and protein

## 2023-07-14 NOTE — Progress Notes (Signed)
 Subjective:   Jennifer Green is a 83 y.o. who presents for a Medicare Wellness preventive visit.  As a reminder, Annual Wellness Visits don't include a physical exam, and some assessments may be limited, especially if this visit is performed virtually. We may recommend an in-person follow-up visit with your provider if needed.  Visit Complete: In person    Persons Participating in Visit: Patient.  AWV Questionnaire: No: Patient Medicare AWV questionnaire was not completed prior to this visit.  Cardiac Risk Factors include: advanced age (>71men, >52 women);hypertension     Objective:     Today's Vitals   07/14/23 1516  BP: 118/62  Pulse: 62  Temp: 98.2 F (36.8 C)  TempSrc: Oral  SpO2: 93%  Weight: 134 lb 4.8 oz (60.9 kg)  Height: 5' (1.524 m)   Body mass index is 26.23 kg/m.     07/14/2023    3:56 PM 11/09/2021   10:31 AM 11/08/2020    2:04 PM 11/08/2019    2:10 PM 04/28/2017    5:00 PM 04/22/2017   10:02 AM 01/30/2017   10:18 AM  Advanced Directives  Does Patient Have a Medical Advance Directive? Yes Yes Yes Yes Yes Yes Yes  Type of Estate agent of [place];Living will Healthcare Power of [place];Living will Healthcare Power of [place];Living will Healthcare Power of [place];Living will Healthcare Power of [place];Living will Healthcare Power of [place];Living will Healthcare Power of [place];Living will  Does patient want to make changes to medical advance directive?    No - Patient declined No - Patient declined  No - Patient declined  Copy of Healthcare Power of Attorney in Chart? No - copy requested No - copy requested No - copy requested No - copy requested No - copy requested No - copy requested Yes    Current Medications (verified) Outpatient Encounter Medications as of 07/14/2023  Medication Sig   allopurinol  (ZYLOPRIM ) 300 MG tablet TAKE ONE TABLET BY MOUTH ONE TIME DAILY (Patient not taking: Reported on 07/14/2023)   amLODipine   (NORVASC ) 10 MG tablet TAKE ONE TABLET BY MOUTH ONE TIME DAILY   Ascorbic Acid (VITAMIN C) 1000 MG tablet Take 1,000 mg by mouth daily.   atenolol  (TENORMIN ) 25 MG tablet TAKE ONE TABLET BY MOUTH ONE TIME DAILY   Calcium  Carb-Cholecalciferol (CALCIUM  600+D3 PO) Take 1 tablet by mouth daily.   diazepam  (VALIUM ) 10 MG tablet TAKE ONE TABLET BY MOUTH AT BEDTIME AS NEEDED FOR ANXIETY OR SLEEP   diclofenac  (VOLTAREN ) 75 MG EC tablet Take 1 tablet (75 mg total) by mouth 2 (two) times daily.   dorzolamide -timolol  (COSOPT ) 22.3-6.8 MG/ML ophthalmic solution Place 1 drop into both eyes 2 (two) times daily.   gabapentin  (NEURONTIN ) 100 MG capsule Take 1 capsule (100 mg total) by mouth 3 (three) times daily.   hydrochlorothiazide  (HYDRODIURIL ) 25 MG tablet Take 1 tablet (25 mg total) by mouth daily.   levothyroxine  (SYNTHROID ) 50 MCG tablet TAKE ONE TABLET BY MOUTH ONE TIME DAILY   Magnesium  400 MG CAPS Take 400 mg by mouth every other day. In the morning   Netarsudil-Latanoprost  (ROCKLATAN) 0.02-0.005 % SOLN Place 1 drop into both eyes nightly.   omeprazole  (PRILOSEC) 40 MG capsule TAKE ONE CAPSULE BY MOUTH ONE TIME DAILY   pilocarpine  (PILOCAR) 4 % ophthalmic solution Place 1 drop into both eyes 2 (two) times daily.   [DISCONTINUED] amLODipine  (NORVASC ) 10 MG tablet TAKE ONE TABLET BY MOUTH ONE TIME DAILY   No facility-administered encounter medications on  file as of 07/14/2023.    Allergies (verified) Indomethacin, Promethazine hcl, and Trazodone  and nefazodone   History: Past Medical History:  Diagnosis Date   Allergy    Anemia    Arthritis    Dyspnea    Elevated blood sugar    790.21   GERD (gastroesophageal reflux disease)    Glaucoma    Gout    Hypertension    Hypokalemia    Hypomagnesemia    Insomnia    Murmur    OP (osteoporosis)    PVC (premature ventricular contraction)    Past Surgical History:  Procedure Laterality Date   ANTERIOR CERVICAL DECOMP/DISCECTOMY FUSION N/A  04/28/2017   Procedure: ANTERIOR CERVICAL DECOMPRESSION/DISCECTOMY FUSION, INTERBODY PROSTHESIS CERVICAL SIX- CERVICAL SEVEN, EXPLORE FUSION, REMOVAL OF OLD SKYLINE PLATE CERVICAL FIVE- CERVICAL SIX;  Surgeon: Garry Kansas, MD;  Location: [facility] OR;  Service: Neurosurgery;  Laterality: N/A;   BACK SURGERY     CERVICAL FUSION  05/2010   per Dr. Garry Kansas    EYE SURGERY     right and left   Family History  Problem Relation Age of Onset   Pneumonia Mother    Hypertension Mother    Breast cancer Mother    Heart disease Father    Social History   Socioeconomic History   Marital status: Widowed    Spouse name: Not on file   Number of children: 2   Years of education: Not on file   Highest education level: 12th grade  Occupational History   Occupation: retired    Comment: Airline pilot  Tobacco Use   Smoking status: Never   Smokeless tobacco: Never  Vaping Use   Vaping status: Never Used  Substance and Sexual Activity   Alcohol use: No   Drug use: No   Sexual activity: Never  Other Topics Concern   Not on file  Social History Narrative   Not on file   Social Drivers of Health   Financial Resource Strain: Low Risk  (07/14/2023)   Overall Financial Resource Strain (CARDIA)    Difficulty of Paying Living Expenses: Not hard at all  Food Insecurity: No Food Insecurity (07/14/2023)   Hunger Vital Sign    Worried About Running Out of Food in the Last Year: Never true    Ran Out of Food in the Last Year: Never true  Transportation Needs: No Transportation Needs (07/14/2023)   PRAPARE - Administrator, Civil Service (Medical): No    Lack of Transportation (Non-Medical): No  Physical Activity: Insufficiently Active (07/14/2023)   Exercise Vital Sign    Days of Exercise per Week: 4 days    Minutes of Exercise per Session: 30 min  Stress: No Stress Concern Present (07/14/2023)   [employer] of Occupational Health - Occupational Stress Questionnaire    Feeling of  Stress : Not at all  Social Connections: Moderately Integrated (07/14/2023)   Social Connection and Isolation Panel [NHANES]    Frequency of Communication with Friends and Family: More than three times a week    Frequency of Social Gatherings with Friends and Family: More than three times a week    Attends Religious Services: More than 4 times per year    Active Member of [employer] or Organizations: Yes    Attends Banker Meetings: More than 4 times per year    Marital Status: Widowed    Tobacco Counseling Counseling given: Not Answered    Clinical Intake:  Pre-visit preparation completed: Yes  Pain : No/denies pain     BMI - recorded: 26.23 Nutritional Status: BMI 25 -29 Overweight Nutritional Risks: None Diabetes: No  Lab Results  Component Value Date   HGBA1C 6.1 07/04/2022   HGBA1C 6.1 03/21/2021   HGBA1C 6.0 05/01/2020     How often do you need to have someone help you when you read instructions, pamphlets, or other written materials from your doctor or pharmacy?: 1 - Never  Interpreter Needed?: No  Information entered by :: Farris Hong LPN   Activities of Daily Living     07/14/2023    3:53 PM  In your present state of health, do you have any difficulty performing the following activities:  Hearing? 1  Comment Wears Hearing Aids  Vision? 0  Difficulty concentrating or making decisions? 0  Walking or climbing stairs? 0  Dressing or bathing? 0  Doing errands, shopping? 0  Preparing Food and eating ? N  Using the Toilet? N  In the past six months, have you accidently leaked urine? N  Do you have problems with loss of bowel control? Y  Comment Followed by Gastrologist  Managing your Medications? N  Managing your Finances? N  Housekeeping or managing your Housekeeping? N    Patient Care Team: Donley Furth, MD as PCP - General (Family Medicine) [facility] [employer] Myles Arvin, [facility] (Inactive) as Pharmacist (Pharmacist)  I have updated your Care Teams any  recent Medical Services you may have received from other providers in the past year.     Assessment:    This is a routine wellness examination for [place].  Hearing/Vision screen Hearing Screening - Comments:: Wears Hearing Aids Vision Screening - Comments:: Wears reading glasses - up to date with routine eye exams with Dr Leanor Proper    Goals Addressed               This Visit's Progress     Increase physical activity (pt-stated)        Remain Active!       Depression Screen     07/14/2023    3:20 PM 10/28/2022    3:02 PM 07/04/2022    8:40 AM 04/09/2022    4:32 PM 11/09/2021   10:27 AM 10/08/2021    3:41 PM 10/02/2021    2:15 PM  PHQ 2/9 Scores  PHQ - 2 Score 0 2 2 2  0 0 1  PHQ- 9 Score  5 9 8  0  3    Fall Risk     07/14/2023    3:55 PM 10/28/2022    9:52 AM 07/04/2022    8:40 AM 04/09/2022    4:31 PM 11/09/2021   10:30 AM  Fall Risk   Falls in the past year? 0 1 0 0 0  Number falls in past yr: 0 0 0 0 0  Injury with Fall? 0 0 0 0 0  Risk for fall due to : No Fall Risks  No Fall Risks No Fall Risks No Fall Risks  Follow up Falls evaluation completed  Falls evaluation completed Falls evaluation completed Falls prevention discussed    MEDICARE RISK AT HOME:  Medicare Risk at Home Any stairs in or around the home?: No If so, are there any without handrails?: No Home free of loose throw rugs in walkways, pet beds, electrical cords, etc?: Yes Adequate lighting in your home to reduce risk of falls?: Yes Life alert?: No Use of a cane, walker or w/c?: No Grab bars in the bathroom?: Yes  Shower chair or bench in shower?: No Elevated toilet seat or a handicapped toilet?: No  TIMED UP AND GO:  Was the test performed?  Yes  Length of time to ambulate 10 feet: 10 sec Gait slow and steady without use of assistive device  Cognitive Function: 6CIT completed    12/01/2015    1:42 PM  MMSE - Mini Mental State Exam  Not completed: --        07/14/2023    3:56 PM 11/09/2021    10:31 AM  6CIT Screen  What Year? 0 points 0 points  What month? 0 points 0 points  What time? 0 points 0 points  Count back from 20 0 points 0 points  Months in reverse 0 points 0 points  Repeat phrase 0 points 0 points  Total Score 0 points 0 points    Immunizations Immunization History  Administered Date(s) Administered   Fluad Quad(high Dose 65+) 10/28/2018, 11/09/2021   Influenza Split 12/05/2010, 11/11/2012   Influenza Whole 02/11/2005, 02/16/2009   Influenza, High Dose Seasonal PF 12/29/2013, 12/04/2016, 12/16/2017   Influenza-Unspecified 11/12/2014, 12/08/2015, 12/04/2016, 12/10/2019   PFIZER(Purple Top)SARS-COV-2 Vaccination 03/12/2019, 04/02/2019, 11/19/2019   Pneumococcal Conjugate-13 12/21/2014   Pneumococcal Polysaccharide-23 02/11/2002, 02/16/2009   Td 02/11/2002   Tdap 12/21/2014   Zoster Recombinant(Shingrix) 11/10/2020, 01/10/2021   Zoster, Live 09/09/2012    Screening Tests Health Maintenance  Topic Date Due   COVID-19 Vaccine (4 - 2024-25 season) 10/13/2022   INFLUENZA VACCINE  09/12/2023   Medicare Annual Wellness (AWV)  07/13/2024   DTaP/Tdap/Td (3 - Td or Tdap) 12/20/2024   Pneumonia Vaccine 24+ Years old  Completed   DEXA SCAN  Completed   Zoster Vaccines- Shingrix  Completed   HPV VACCINES  Aged Out   Meningococcal B Vaccine  Aged Out    Health Maintenance  Health Maintenance Due  Topic Date Due   COVID-19 Vaccine (4 - 2024-25 season) 10/13/2022   Health Maintenance Items Addressed:   Additional Screening:  Vision Screening: Recommended annual ophthalmology exams for early detection of glaucoma and other disorders of the eye. Would you like a referral to an eye doctor? No    Dental Screening: Recommended annual dental exams for proper oral hygiene  Community Resource Referral / Chronic Care Management: CRR required this visit?  No   CCM required this visit?  No   Plan:    I have personally reviewed and noted the following in  the patient's chart:   Medical and social history Use of alcohol, tobacco or illicit drugs  Current medications and supplements including opioid prescriptions. Patient is not currently taking opioid prescriptions. Functional ability and status Nutritional status Physical activity Advanced directives List of other physicians Hospitalizations, surgeries, and ER visits in previous 12 months Vitals Screenings to include cognitive, depression, and falls Referrals and appointments  In addition, I have reviewed and discussed with patient certain preventive protocols, quality metrics, and best practice recommendations. A written personalized care plan for preventive services as well as general preventive health recommendations were provided to patient.   Dewayne Ford, LPN   4/0/9811   After Visit Summary: (In Person-Printed) AVS printed and given to the patient  Notes: Nothing significant to report at this time.

## 2023-07-14 NOTE — Telephone Encounter (Signed)
 I understand her frustration. Dr. [place] has ordered an ECHO to check for heart failure. Her weight gain is likely related to her fluid retention. I reviewed her recent lab results, and it has been a year since we checked an A1c or her thyroid  levels. I will order these, so she can make a lab appt sometime soon

## 2023-07-14 NOTE — Patient Instructions (Addendum)
 Jennifer Green , Thank you for taking time out of your busy schedule to complete your Annual Wellness Visit with me. I enjoyed our conversation and look forward to speaking with you again next year. I, as well as your care team,  appreciate your ongoing commitment to your health goals. Please review the following plan we discussed and let me know if I can assist you in the future. Your Game plan/ To Do List    Referrals: If you haven't heard from the office you've been referred to, please reach out to them at the phone provided.   Follow up Visits: Next Medicare AWV with our clinical staff: 07/19/24 @ 3p   Have you seen your provider in the last 6 months (3 months if uncontrolled diabetes)? Yes 01/29/23 Next Office Visit with your provider:   Clinician Recommendations:  Aim for 30 minutes of exercise or brisk walking, 6-8 glasses of water, and 5 servings of fruits and vegetables each day.       This is a list of the screening recommended for you and due dates:  Health Maintenance  Topic Date Due   COVID-19 Vaccine (4 - 2024-25 season) 10/13/2022   Flu Shot  09/12/2023   Medicare Annual Wellness Visit  07/13/2024   DTaP/Tdap/Td vaccine (3 - Td or Tdap) 12/20/2024   Pneumonia Vaccine  Completed   DEXA scan (bone density measurement)  Completed   Zoster (Shingles) Vaccine  Completed   HPV Vaccine  Aged Out   Meningitis B Vaccine  Aged Out    Advanced directives: (Copy Requested) Please bring a copy of your health care power of attorney and living will to the office to be added to your chart at your convenience. You can mail to [facility] [address] [place], [place] [address] or email to [email or web address] Advance Care Planning is important because it:  [x]  Makes sure you receive the medical care that is consistent with your values, goals, and preferences  [x]  It provides guidance to your family and loved ones and reduces their decisional burden about whether or  not they are making the right decisions based on your wishes.  Follow the link provided in your after visit summary or read over the paperwork we have mailed to you to help you started getting your Advance Directives in place. If you need assistance in completing these, please reach out to us  so that we can help you!  See attachments for Preventive Care and Fall Prevention Tips.

## 2023-07-17 ENCOUNTER — Ambulatory Visit (HOSPITAL_COMMUNITY)
Admission: RE | Admit: 2023-07-17 | Discharge: 2023-07-17 | Disposition: A | Discharge status: Home / Self Care | Source: Ambulatory Visit | Attending: Internal Medicine | Admitting: Internal Medicine

## 2023-07-17 ENCOUNTER — Other Ambulatory Visit: Payer: Self-pay | Admitting: Internal Medicine

## 2023-07-17 DIAGNOSIS — R188 Other ascites: Secondary | ICD-10-CM | POA: Diagnosis present

## 2023-07-17 MED ORDER — LIDOCAINE HCL 1 % IJ SOLN
INTRAMUSCULAR | Status: AC
  Filled 2023-07-17: qty 20

## 2023-07-17 NOTE — Procedures (Signed)
 PROCEDURE SUMMARY:  IR was consulted for paracentesis secondary to ascites.   Upon scanning all four quadrants with US , there was no visualized pocket of fluid amenable to paracentesis. Images saved.  Please see imaging section of Epic for full dictation.  Damian Duke Kwamane Whack PA-C 07/17/2023 8:18 AM

## 2023-07-22 ENCOUNTER — Ambulatory Visit: Payer: Self-pay | Admitting: Internal Medicine

## 2023-07-24 ENCOUNTER — Other Ambulatory Visit: Payer: Self-pay | Admitting: Family Medicine

## 2023-07-24 ENCOUNTER — Encounter: Payer: Self-pay | Admitting: Family Medicine

## 2023-07-25 ENCOUNTER — Other Ambulatory Visit: Payer: Self-pay | Admitting: Family Medicine

## 2023-07-25 ENCOUNTER — Ambulatory Visit: Payer: Self-pay

## 2023-07-25 MED ORDER — DIAZEPAM 10 MG PO TABS: 10.0000 mg | ORAL_TABLET | Freq: Every evening | ORAL | 5 refills | Status: DC | PRN

## 2023-07-25 NOTE — Telephone Encounter (Signed)
 Sarah triage nurse will let pt know it can take up to 3 business days and pt does have an appt on 07-28-2022

## 2023-07-25 NOTE — Telephone Encounter (Signed)
 Copied from CRM [id number]. Topic: Clinical - Red Word Triage >> Jul 25, 2023  1:34 PM Mesmerise C wrote: [facility] that prompted transfer to Nurse Triage: Patient checking about her diazepam  (VALIUM ) 10 MG tablet she hasn't had it in 3 days going on 4 starting tomorrow a refill request CRM was sent in and she was advised can take 3 business days but patient is worried about going through withdrawals Reason for Disposition . [1] Prescription refill request for ESSENTIAL medicine (i.e., likelihood of harm to patient if not taken) AND [2] triager unable to refill per department policy  Answer Assessment - Initial Assessment Questions Pt has not had this medication going on 4 days now and is concerned she will withdrawal from it. Pt is currently asymptomatic. RN advised pt RN would relay concern to the office and that if the pt begins experiencing symptoms, to give us  a call. RN reviewed with pt the symptoms to monitor for and pt verbalized understanding.  1. DRUG NAME: What medicine do you need to have refilled?     Valium   2. REFILLS REMAINING: How many refills are remaining? (Note: The label on the medicine or pill bottle will show how many refills are remaining. If there are no refills remaining, then a renewal may be needed.)     0 4. PRESCRIBING HCP: Who prescribed it? Reason: If prescribed by specialist, call should be referred to that group.     Dr. Alyne Babinski 5. SYMPTOMS: Do you have any symptoms?     denies  Protocols used: Medication Refill and Renewal Call-A-AH

## 2023-07-25 NOTE — Telephone Encounter (Signed)
Sent to PCP for approval.  

## 2023-07-25 NOTE — Telephone Encounter (Signed)
 Pt LOV was on 01/29/23 Last refill was done on 01/21/23 Please advise

## 2023-07-25 NOTE — Telephone Encounter (Signed)
 Copied from CRM [id number]. Topic: Clinical - Medication Refill >> Jul 25, 2023  9:45 AM Clyde Darling P wrote: Medication: diazepam  (VALIUM ) 10 MG tablet  Has the patient contacted their pharmacy? Yes- pharmacy has sent electronic request to refill medication (Agent: If no, request that the patient contact the pharmacy for the refill. If patient does not wish to contact the pharmacy document the reason why and proceed with request.) (Agent: If yes, when and what did the pharmacy advise?)  This is the patient's preferred pharmacy:  [id number] [place] - [place], [place] - [address] [place] [facility] [address] [place]. [place] [address] Phone: [phone] Fax: [phone]  Is this the correct pharmacy for this prescription? Yes If no, delete pharmacy and type the correct one.   Has the prescription been filled recently? No  Is the patient out of the medication? Yes has been out for 3 days   Has the patient been seen for an appointment in the last year OR does the patient have an upcoming appointment? Yes  Can we respond through MyChart? Yes  Agent: Please be advised that Rx refills may take up to 3 business days. We ask that you follow-up with your pharmacy.

## 2023-07-28 ENCOUNTER — Ambulatory Visit (INDEPENDENT_AMBULATORY_CARE_PROVIDER_SITE_OTHER): Admitting: Family Medicine

## 2023-07-28 ENCOUNTER — Encounter: Payer: Self-pay | Admitting: Family Medicine

## 2023-07-28 VITALS — BP 124/62 | HR 68 | Temp 98.5°F | Wt 136.4 lb

## 2023-07-28 DIAGNOSIS — J019 Acute sinusitis, unspecified: Secondary | ICD-10-CM

## 2023-07-28 MED ORDER — AMOXICILLIN-POT CLAVULANATE 875-125 MG PO TABS: 1.0000 | ORAL_TABLET | Freq: Two times a day (BID) | ORAL | 0 refills | Status: DC

## 2023-07-28 NOTE — Progress Notes (Signed)
  Subjective:     Patient ID: Jennifer Green, female   DOB: Apr 08, 1940, 83 y.o.   MRN: [id number]  HPI Here for one week of stuffy head and ears, PND, and dry cough. She had a fever at first but not now.   Review of Systems  Constitutional: Negative.   HENT:  Positive for congestion, postnasal drip and sinus pressure. Negative for ear pain and sore throat.   Eyes: Negative.   Respiratory:  Positive for cough. Negative for shortness of breath and wheezing.         Objective:   Physical Exam Constitutional:      Appearance: Normal appearance.  HENT:     Right Ear: Tympanic membrane, ear canal and external ear normal.     Left Ear: Tympanic membrane, ear canal and external ear normal.     Nose: Nose normal.     Mouth/Throat:     Pharynx: Oropharynx is clear.   Eyes:     Conjunctiva/sclera: Conjunctivae normal.   Pulmonary:     Effort: Pulmonary effort is normal.     Breath sounds: Normal breath sounds.  Lymphadenopathy:     Cervical: No cervical adenopathy.   Neurological:     Mental Status: She is alert.        Assessment:    Sinusitis.     Plan:    Treat with 10 days of Augmentin and add Mucinex as needed.  Corita Diego, MD

## 2023-07-29 ENCOUNTER — Ambulatory Visit: Admitting: Gastroenterology

## 2023-07-29 ENCOUNTER — Encounter: Payer: Self-pay | Admitting: Gastroenterology

## 2023-07-29 VITALS — BP 130/70 | HR 62 | Ht 60.0 in | Wt 137.0 lb

## 2023-07-29 DIAGNOSIS — R1031 Right lower quadrant pain: Secondary | ICD-10-CM | POA: Diagnosis not present

## 2023-07-29 DIAGNOSIS — R194 Change in bowel habit: Secondary | ICD-10-CM | POA: Diagnosis not present

## 2023-07-29 DIAGNOSIS — S32050A Wedge compression fracture of fifth lumbar vertebra, initial encounter for closed fracture: Secondary | ICD-10-CM | POA: Diagnosis not present

## 2023-07-29 NOTE — Patient Instructions (Addendum)
 A high fiber diet with plenty of fluids (up to 8 glasses of water daily) is suggested to relieve these symptoms.  Benefiber, 1 tablespoon once or twice daily can be used to keep bowels regular if needed.   Ibgard up to 4 times per day (take one at bedtime and see how you do)  _______________________________________________________  If your blood pressure at your visit was 140/90 or greater, please contact your primary care physician to follow up on this.  _______________________________________________________  If you are age 42 or older, your body mass index should be between 23-30. Your Body mass index is 26.76 kg/m. If this is out of the aforementioned range listed, please consider follow up with your Primary Care Provider.  If you are age 47 or younger, your body mass index should be between 19-25. Your Body mass index is 26.76 kg/m. If this is out of the aformentioned range listed, please consider follow up with your Primary Care Provider.   ________________________________________________________  The [facility] providers would like to encourage you to use MYCHART to communicate with providers for non-urgent requests or questions.  Due to long hold times on the telephone, sending your provider a message by [facility] may be a faster and more efficient way to get a response.  Please allow 48 business hours for a response.  Please remember that this is for non-urgent requests.  _______________________________________________________  We have placed a referral to GYN and they will contact you about an appointment.  Call us  in early July for a September appointment with Dr Bridgett Camps.  I appreciate the opportunity to care for you. Suzanna Erp, PA

## 2023-07-29 NOTE — Progress Notes (Signed)
 Chief Complaint: RLQ pain Primary GI MD: Dr. Bridgett Camps  HPI: Discussed the use of AI scribe software for clinical note transcription with the patient, who gave verbal consent to proceed.  History of Present Illness Jennifer Green is an 83 year old female who presents with right lower abdominal pain and irregular bowel movements.  She experiences sharp pain in the right lower abdomen, primarily at night after going to bed. The pain is located in the groin area and is described as 'really, really sharp'. It often gives her the urge to urinate, but she is unable to do so. Walking around for 5 to 10 minutes helps alleviate the pain temporarily, but it recurs every two hours. She has a history of back problems and suspects the pain might be related to this.  She also experiences irregular bowel movements, with two to three bowel movements per week, including episodes of diarrhea. Sometimes she has multiple bowel movements in a day, followed by days without any. She does not believe that her bowel movement pattern affects the pain at night.  In March, she underwent a CT scan, and an attempt to remove fluid from her abdomen was unsuccessful as no fluid was found. Previous imaging in October 2024 indicated a thickened endometrial stripe on a transvaginal ultrasound.  She is currently taking a diuretic prescribed for high blood pressure, which she has been on for many years. She also takes eye drops and has been prescribed amoxicillin for a sinus infection.   PREVIOUS GI WORKUP   IR abdomen US  Limited 07/2023 No visualized ascites within any of the 4 abdominal quadrants amenable to paracentesis  CT abdomen pelvis with contrast 04/2023 IMPRESSION: 1. No acute findings. 2. Steatotic enlarged liver. Hyperdense right hepatic lobe lesion, better characterized on MR abdomen 01/11/2023. Please refer to that report. 3. Cholelithiasis. 4. Left adrenal adenoma. 5. New L5 superior endplate compression  fracture, age indeterminate. 6.  Aortic atherosclerosis (ICD10-I70.0)  MRI abdomen 12/2022 IMPRESSION: 1. Vividly enhancing lesion of the posterior right lobe of the liver, hepatic segment VII, measuring 2.6 x 2.1 cm. This demonstrates minimal underlying intrinsic T2 hyperintensity and fades towards parenchymal background on remaining multiphasic sequences without evidence of washout nor capsular enhancement. 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 Consider follow-up in 6 months to ensure stability. If there is known chronic high risk liver disease (cirrhosis, chronic viral hepatitis), this is a LI-RADS category 3 lesion, intermediate suspicion for hepatocellular carcinoma, and follow-up in 6 months is recommended. 2. Severe hepatic steatosis. 3. Cholelithiasis.  US  pelvic complete 11/2022 IMPRESSION: 1. Limited study due to lack of transvaginal imaging. 2. Endometrium not well visualized. 3. Echogenic myometrial lesions which could be atypical fibroids. This finding can be evaluated further with MRI. 4. Left ovary not visualized. No adnexal pathology.    CT abdomen pelvis with contrast 11/2022 IMPRESSION: 1. No acute findings or explanation for the patient's symptoms. No evidence of appendicitis or bowel obstruction. 2. Cholelithiasis without evidence of cholecystitis or biliary ductal dilatation. 3. Indeterminate 2.3 cm hyperdense lesion posteriorly in the right hepatic lobe, not clearly seen on remote chest CT's. This could reflect a flash filling hemangioma. Recommend further evaluation with nonemergent abdominal MRI without and with contrast. 4. Endometrial thickening for age. Recommend further evaluation with pelvic ultrasound (especially if there is abnormal vaginal bleeding). 5.  Aortic Atherosclerosis (ICD10-I70.0).  Past Medical History:  Diagnosis  Date   Allergy  Anemia    Arthritis    Dyspnea    Elevated blood sugar    790.21   GERD (gastroesophageal reflux disease)    Glaucoma    Gout    Hypertension    Hypokalemia    Hypomagnesemia    Insomnia    Murmur    OP (osteoporosis)    PVC (premature ventricular contraction)     Past Surgical History:  Procedure Laterality Date   ANTERIOR CERVICAL DECOMP/DISCECTOMY FUSION N/A 04/28/2017   Procedure: ANTERIOR CERVICAL DECOMPRESSION/DISCECTOMY FUSION, INTERBODY PROSTHESIS CERVICAL SIX- CERVICAL SEVEN, EXPLORE FUSION, REMOVAL OF OLD SKYLINE PLATE CERVICAL FIVE- CERVICAL SIX;  Surgeon: Garry Kansas, MD;  Location: [facility] OR;  Service: Neurosurgery;  Laterality: N/A;   BACK SURGERY     CERVICAL FUSION  05/2010   per Dr. Garry Kansas    EYE SURGERY     right and left    Current Outpatient Medications  Medication Sig Dispense Refill   amLODipine  (NORVASC ) 10 MG tablet TAKE ONE TABLET BY MOUTH ONE TIME DAILY 90 tablet 1   amoxicillin-clavulanate (AUGMENTIN) 875-125 MG tablet Take 1 tablet by mouth 2 (two) times daily. 20 tablet 0   Ascorbic Acid (VITAMIN C) 1000 MG tablet Take 1,000 mg by mouth daily.     atenolol  (TENORMIN ) 25 MG tablet TAKE ONE TABLET BY MOUTH ONE TIME DAILY 90 tablet 0   Calcium  Carb-Cholecalciferol (CALCIUM  600+D3 PO) Take 1 tablet by mouth daily.     diazepam  (VALIUM ) 10 MG tablet Take 1 tablet (10 mg total) by mouth at bedtime as needed for sleep. 30 tablet 5   dorzolamide -timolol  (COSOPT ) 22.3-6.8 MG/ML ophthalmic solution Place 1 drop into both eyes 2 (two) times daily.     gabapentin  (NEURONTIN ) 100 MG capsule Take 1 capsule (100 mg total) by mouth 3 (three) times daily. 90 capsule 3   hydrochlorothiazide  (HYDRODIURIL ) 25 MG tablet Take 1 tablet (25 mg total) by mouth daily. 90 tablet 3   levothyroxine  (SYNTHROID ) 50 MCG tablet TAKE ONE TABLET BY MOUTH ONE TIME DAILY 90 tablet 0   Magnesium  400 MG CAPS Take 400 mg by mouth every other day. In the  morning     Netarsudil-Latanoprost  (ROCKLATAN) 0.02-0.005 % SOLN Place 1 drop into both eyes nightly.     omeprazole  (PRILOSEC) 40 MG capsule TAKE ONE CAPSULE BY MOUTH ONE TIME DAILY 90 capsule 0   pilocarpine  (PILOCAR) 4 % ophthalmic solution Place 1 drop into both eyes 2 (two) times daily.     allopurinol  (ZYLOPRIM ) 300 MG tablet TAKE ONE TABLET BY MOUTH ONE TIME DAILY (Patient not taking: Reported on 07/29/2023) 90 tablet 0   diclofenac  (VOLTAREN ) 75 MG EC tablet Take 1 tablet (75 mg total) by mouth 2 (two) times daily. (Patient not taking: Reported on 07/29/2023) 180 tablet 3   No current facility-administered medications for this visit.    Allergies as of 07/29/2023 - Review Complete 07/29/2023  Allergen Reaction Noted   Indomethacin  01/02/2007   Promethazine hcl Other (See Comments) 01/02/2007   Trazodone  and nefazodone Other (See Comments) 07/30/2021    Family History  Problem Relation Age of Onset   Pneumonia Mother    Hypertension Mother    Breast cancer Mother    Heart disease Father     Social History   Socioeconomic History   Marital status: Widowed    Spouse name: Not on file   Number of children: 2   Years of education: Not on file   Highest education  level: 12th grade  Occupational History   Occupation: retired    Comment: Airline pilot  Tobacco Use   Smoking status: Never   Smokeless tobacco: Never  Vaping Use   Vaping status: Never Used  Substance and Sexual Activity   Alcohol use: No   Drug use: No   Sexual activity: Never  Other Topics Concern   Not on file  Social History Narrative   Not on file   Social Drivers of Health   Financial Resource Strain: Low Risk  (07/14/2023)   Overall Financial Resource Strain (CARDIA)    Difficulty of Paying Living Expenses: Not hard at all  Food Insecurity: No Food Insecurity (07/14/2023)   Hunger Vital Sign    Worried About Running Out of Food in the Last Year: Never true    Ran Out of Food in the Last Year:  Never true  Transportation Needs: No Transportation Needs (07/14/2023)   PRAPARE - Administrator, Civil Service (Medical): No    Lack of Transportation (Non-Medical): No  Physical Activity: Insufficiently Active (07/14/2023)   Exercise Vital Sign    Days of Exercise per Week: 4 days    Minutes of Exercise per Session: 30 min  Stress: No Stress Concern Present (07/14/2023)   [employer] of Occupational Health - Occupational Stress Questionnaire    Feeling of Stress : Not at all  Social Connections: Moderately Integrated (07/14/2023)   Social Connection and Isolation Panel    Frequency of Communication with Friends and Family: More than three times a week    Frequency of Social Gatherings with Friends and Family: More than three times a week    Attends Religious Services: More than 4 times per year    Active Member of [employer] or Organizations: Yes    Attends Banker Meetings: More than 4 times per year    Marital Status: Widowed  Intimate Partner Violence: Not At Risk (07/14/2023)   Humiliation, Afraid, Rape, and Kick questionnaire    Fear of Current or Ex-Partner: No    Emotionally Abused: No    Physically Abused: No    Sexually Abused: No    Review of Systems:    Constitutional: No weight loss, fever, chills, weakness or fatigue HEENT: Eyes: No change in vision               Ears, Nose, Throat:  No change in hearing or congestion Skin: No rash or itching Cardiovascular: No chest pain, chest pressure or palpitations   Respiratory: No SOB or cough Gastrointestinal: See HPI and otherwise negative Genitourinary: No dysuria or change in urinary frequency Neurological: No headache, dizziness or syncope Musculoskeletal: No new muscle or joint pain Hematologic: No bleeding or bruising Psychiatric: No history of depression or anxiety    Physical Exam:  Vital signs: BP 130/70   Pulse 62   Ht 5' (1.524 m)   Wt 137 lb (62.1 kg)   BMI 26.76 kg/m    Constitutional: NAD, alert and cooperative Head:  Normocephalic and atraumatic. Eyes:   PEERL, EOMI. No icterus. Conjunctiva pink. Respiratory: Respirations even and unlabored. Lungs clear to auscultation bilaterally.   No wheezes, crackles, or rhonchi.  Cardiovascular:  Regular rate and rhythm. No peripheral edema, cyanosis or pallor.  Gastrointestinal:  Soft, nondistended, nontender. No rebound or guarding. Normal bowel sounds. No appreciable masses or hepatomegaly. Rectal:  Declines Msk:  Symmetrical without gross deformities. Without edema, no deformity or joint abnormality.  Neurologic:  Alert and  oriented  x4;  grossly normal neurologically.  Skin:   Dry and intact without significant lesions or rashes. Psychiatric: Oriented to person, place and time. Demonstrates good judgement and reason without abnormal affect or behaviors.  RELEVANT LABS AND IMAGING: CBC    Component Value Date/Time   WBC 4.8 05/07/2023 1100   RBC 4.50 05/07/2023 1100   HGB 13.9 05/07/2023 1100   HCT 41.2 05/07/2023 1100   PLT 291.0 05/07/2023 1100   MCV 91.5 05/07/2023 1100   MCH 30.4 04/22/2017 1013   MCHC 33.7 05/07/2023 1100   RDW 13.5 05/07/2023 1100   LYMPHSABS 1.9 05/07/2023 1100   MONOABS 0.5 05/07/2023 1100   EOSABS 0.1 05/07/2023 1100   BASOSABS 0.0 05/07/2023 1100    CMP     Component Value Date/Time   NA 134 (L) 05/07/2023 1100   K 3.9 05/07/2023 1100   CL 97 05/07/2023 1100   CO2 26 05/07/2023 1100   GLUCOSE 144 (H) 05/07/2023 1100   BUN 22 05/07/2023 1100   CREATININE 0.94 05/07/2023 1100   CALCIUM  9.4 05/07/2023 1100   PROT 7.3 05/07/2023 1100   ALBUMIN  4.4 05/07/2023 1100   AST 24 05/07/2023 1100   ALT 25 05/07/2023 1100   ALKPHOS 116 05/07/2023 1100   BILITOT 0.3 05/07/2023 1100   GFRNONAA 59 (L) 04/22/2017 1013   GFRAA >60 04/22/2017 1013     Assessment/Plan:   RLQ pain Extensive imaging with CT, MRI, pelvic ultrasound, and IR unable to perform paracentesis as no  fluid is present.  Normal labs. Patient describes a sharp RLQ pain that goes from her groin along the line of her anterior iliac spine to iliac crest and then down into her back.  History of back issues.  This occurs every night at bedtime and improves with walking.  Extensive imaging with most recent CT showing new L5 compression fracture.   Suspect her compression fracture may be a large component of her pain especially since her pain improves with walking which is likely taking pressure off of the nerve root.   However, possible bowel component as she has irregular bowel habits though less likely the sole source of pain and then also cannot rule out GYN component with thickened endometrium and limited evaluation on imaging.  - Follow-up with primary care regarding compression fracture - Optimize bowel regimen with fiber 1 to 2 tablespoons daily and can use IBgard - Follow-up with GYN for evaluation of thickened endometrium - Alert to call patient in 2 weeks to check up on her progress with fiber - Follow-up in 3 months  Altered bowel habits Longstanding history of altered bowel habits in which she will go few days without a bowel movement and then also have multiple loose bowel movements per day.  Could be contributing to her pain, though less likely. - Trial of fiber - Increase water, increase fiber, increase exercise - Follow-up 3 months (phone call in 2 weeks to check the patient)   This visit required 38 minutes of patient care (this includes precharting, chart review, review of results, face-to-face time used for counseling as well as treatment plan and follow-up. The patient was provided an opportunity to ask questions and all were answered. The patient agreed with the plan and demonstrated an understanding of the instructions.   Gigi Kyle [facility] 07/29/2023, 3:44 PM  Cc: Donley Furth, MD

## 2023-07-30 ENCOUNTER — Encounter: Payer: Self-pay | Admitting: Internal Medicine

## 2023-08-17 NOTE — Progress Notes (Signed)
 Addendum: Reviewed and agree with assessment and management plan. Asha Grumbine, Carie Caddy, MD

## 2023-08-20 NOTE — Telephone Encounter (Signed)
 Please make an OV for her to see me so we can check any urinary issues

## 2023-08-20 NOTE — Telephone Encounter (Signed)
 I have included Dr. Johnny on this response Dr. Johnny she is having groin pain and urinary symptoms; we have evaluated her abdomen by CT scan.  She is having loose stools every week or so but in between stools seem to be okay She is wondering if this could be rectal, I am going to recommend a flex sig Could you help workup urinary symptoms as a possible cause for this lower right pelvic pain? Thanks Gordy Britain A Triage: Offer patient a flexible sigmoidoscopy in the [facility] for her lower pelvic pain and bowel changes It is okay for her to use Imodium 2 mg 1 to 2 tablets when needed for loose stools or diarrhea Her previous stool studies were unremarkable for infection, pancreatic insufficiency and inflammation JMP

## 2023-08-21 ENCOUNTER — Other Ambulatory Visit: Payer: Self-pay

## 2023-08-21 DIAGNOSIS — R1031 Right lower quadrant pain: Secondary | ICD-10-CM

## 2023-08-21 DIAGNOSIS — R197 Diarrhea, unspecified: Secondary | ICD-10-CM

## 2023-08-21 NOTE — Telephone Encounter (Signed)
 Yes, I had answered the earlier message by saying I would like for her to make an office visit with me to evaluate this

## 2023-08-25 ENCOUNTER — Ambulatory Visit (HOSPITAL_COMMUNITY)
Admission: RE | Admit: 2023-08-25 | Discharge: 2023-08-25 | Disposition: A | Discharge status: Home / Self Care | Source: Ambulatory Visit | Attending: Cardiology | Admitting: Cardiology

## 2023-08-25 ENCOUNTER — Ambulatory Visit: Payer: Self-pay | Admitting: Cardiology

## 2023-08-25 DIAGNOSIS — R0602 Shortness of breath: Secondary | ICD-10-CM | POA: Insufficient documentation

## 2023-08-25 DIAGNOSIS — I10 Essential (primary) hypertension: Secondary | ICD-10-CM | POA: Insufficient documentation

## 2023-08-25 LAB — ECHOCARDIOGRAM COMPLETE
Area-P 1/2: 3.26 cm2
S' Lateral: 2.7 cm

## 2023-08-26 ENCOUNTER — Ambulatory Visit: Payer: Medicare Other | Admitting: Dermatology

## 2023-08-26 ENCOUNTER — Ambulatory Visit (INDEPENDENT_AMBULATORY_CARE_PROVIDER_SITE_OTHER): Admitting: Family Medicine

## 2023-08-26 ENCOUNTER — Encounter: Payer: Self-pay | Admitting: Family Medicine

## 2023-08-26 VITALS — BP 126/74 | HR 68 | Temp 98.5°F | Wt 139.2 lb

## 2023-08-26 DIAGNOSIS — N3281 Overactive bladder: Secondary | ICD-10-CM | POA: Diagnosis not present

## 2023-08-26 LAB — POC URINALSYSI DIPSTICK (AUTOMATED)
Bilirubin, UA: NEGATIVE
Blood, UA: NEGATIVE
Glucose, UA: NEGATIVE
Ketones, UA: NEGATIVE
Leukocytes, UA: NEGATIVE
Nitrite, UA: NEGATIVE
Protein, UA: POSITIVE — AB
Spec Grav, UA: 1.025 (ref 1.010–1.025)
Urobilinogen, UA: 0.2 U/dL
pH, UA: 5.5 (ref 5.0–8.0)

## 2023-08-26 MED ORDER — OXYBUTYNIN CHLORIDE ER 10 MG PO TB24: 10.0000 mg | ORAL_TABLET | Freq: Every day | ORAL | 5 refills | Status: DC

## 2023-08-26 NOTE — Progress Notes (Signed)
   Subjective:    Patient ID: Orlean JULIANNA Molt, female    DOB: 1940/05/25, 83 y.o.   MRN: [id number]  HPI Here for several months of increased frequency and urgency of urination. No burning. She gets up 5-6 times a night to urinate, and when she does very little urine comes out. Her UA today is clear.    Review of Systems  Constitutional: Negative.   Respiratory: Negative.    Cardiovascular: Negative.   Genitourinary:  Positive for frequency and urgency. Negative for dysuria, flank pain and hematuria.       Objective:   Physical Exam Constitutional:      Appearance: Normal appearance. She is not ill-appearing.  Cardiovascular:     Rate and Rhythm: Normal rate and regular rhythm.     Pulses: Normal pulses.     Heart sounds: Normal heart sounds.  Pulmonary:     Effort: Pulmonary effort is normal.     Breath sounds: Normal breath sounds.  Abdominal:     Tenderness: There is no right CVA tenderness or left CVA tenderness.  Neurological:     Mental Status: She is alert.           Assessment & Plan:  OAB, treat with Oxybutynin  XL 10 mg at bedtime. She will report back in 2 weeks.  Garnette Olmsted, MD

## 2023-08-26 NOTE — Addendum Note (Signed)
 Addended by: LADONNA INOCENTE SAILOR on: 08/26/2023 09:54 AM   Modules accepted: Orders

## 2023-09-01 ENCOUNTER — Other Ambulatory Visit: Payer: Self-pay | Admitting: Family Medicine

## 2023-09-01 DIAGNOSIS — I10 Essential (primary) hypertension: Secondary | ICD-10-CM

## 2023-09-01 DIAGNOSIS — K219 Gastro-esophageal reflux disease without esophagitis: Secondary | ICD-10-CM

## 2023-09-05 ENCOUNTER — Other Ambulatory Visit: Payer: Self-pay | Admitting: Family Medicine

## 2023-09-05 DIAGNOSIS — K219 Gastro-esophageal reflux disease without esophagitis: Secondary | ICD-10-CM

## 2023-09-13 ENCOUNTER — Other Ambulatory Visit: Payer: Self-pay | Admitting: Family Medicine

## 2023-10-02 ENCOUNTER — Encounter: Payer: Self-pay | Admitting: Internal Medicine

## 2023-10-02 ENCOUNTER — Ambulatory Visit: Admitting: Internal Medicine

## 2023-10-02 VITALS — BP 145/79 | HR 65 | Temp 98.4°F | Resp 12 | Ht 60.0 in | Wt 137.0 lb

## 2023-10-02 DIAGNOSIS — K648 Other hemorrhoids: Secondary | ICD-10-CM | POA: Diagnosis not present

## 2023-10-02 DIAGNOSIS — R1031 Right lower quadrant pain: Secondary | ICD-10-CM

## 2023-10-02 DIAGNOSIS — R197 Diarrhea, unspecified: Secondary | ICD-10-CM | POA: Diagnosis not present

## 2023-10-02 DIAGNOSIS — R194 Change in bowel habit: Secondary | ICD-10-CM

## 2023-10-02 DIAGNOSIS — R1030 Lower abdominal pain, unspecified: Secondary | ICD-10-CM

## 2023-10-02 MED ORDER — SODIUM CHLORIDE 0.9 % IV SOLN: 500.0000 mL | INTRAVENOUS | Status: AC

## 2023-10-02 NOTE — Progress Notes (Signed)
 GASTROENTEROLOGY PROCEDURE H&P NOTE   Primary Care Physician: Johnny Garnette LABOR, MD    Reason for Procedure:  Altered bowel habits, right lower quadrant abdominal pain, groin pain and diarrhea  Plan:    Flexible sigmoidoscopy  Patient is appropriate for endoscopic procedure(s) in the ambulatory ([facility]) setting.  The nature of the procedure, as well as the risks, benefits, and alternatives were carefully and thoroughly reviewed with the patient. Ample time for discussion and questions allowed. The patient understood, was satisfied, and agreed to proceed.     HPI: Jennifer Green is a 83 y.o. female who presents for flexible sigmoidoscopy.  Medical history as below.  Tolerated the prep.  No recent chest pain or shortness of breath.  No abdominal pain today.  Past Medical History:  Diagnosis Date   Allergy    Anemia    Arthritis    Dyspnea    Elevated blood sugar    790.21   GERD (gastroesophageal reflux disease)    Glaucoma    Gout    Hypertension    Hypokalemia    Hypomagnesemia    Insomnia    Murmur    OP (osteoporosis)    PVC (premature ventricular contraction)     Past Surgical History:  Procedure Laterality Date   ANTERIOR CERVICAL DECOMP/DISCECTOMY FUSION N/A 04/28/2017   Procedure: ANTERIOR CERVICAL DECOMPRESSION/DISCECTOMY FUSION, INTERBODY PROSTHESIS CERVICAL SIX- CERVICAL SEVEN, EXPLORE FUSION, REMOVAL OF OLD SKYLINE PLATE CERVICAL FIVE- CERVICAL SIX;  Surgeon: Mavis Purchase, MD;  Location: [facility] OR;  Service: Neurosurgery;  Laterality: N/A;   BACK SURGERY     CERVICAL FUSION  05/2010   per Dr. Purchase Mavis    EYE SURGERY     right and left    Prior to Admission medications   Medication Sig Start Date End Date Taking? Authorizing Provider  amLODipine  (NORVASC ) 10 MG tablet TAKE ONE TABLET BY MOUTH ONE TIME DAILY 07/14/23  Yes Johnny Garnette LABOR, MD  Ascorbic Acid (VITAMIN C) 1000 MG tablet Take 1,000 mg by mouth daily.   Yes [provider]   atenolol  (TENORMIN ) 25 MG tablet TAKE ONE TABLET BY MOUTH ONE TIME DAILY 09/02/23  Yes Johnny Garnette LABOR, MD  Calcium  Carb-Cholecalciferol (CALCIUM  600+D3 PO) Take 1 tablet by mouth daily.   Yes [provider]  diazepam  (VALIUM ) 10 MG tablet Take 1 tablet (10 mg total) by mouth at bedtime as needed for sleep. 07/25/23  Yes Johnny Garnette LABOR, MD  gabapentin  (NEURONTIN ) 100 MG capsule Take 1 capsule (100 mg total) by mouth 3 (three) times daily. 01/29/23  Yes Johnny Garnette LABOR, MD  levothyroxine  (SYNTHROID ) 50 MCG tablet TAKE ONE TABLET BY MOUTH ONE TIME DAILY 09/02/23  Yes Johnny Garnette LABOR, MD  Magnesium  400 MG CAPS Take 400 mg by mouth every other day. In the morning   Yes [provider]  omeprazole  (PRILOSEC) 40 MG capsule TAKE ONE CAPSULE BY MOUTH ONE TIME DAILY 09/02/23  Yes Johnny Garnette LABOR, MD  oxybutynin  (DITROPAN -XL) 10 MG 24 hr tablet Take 1 tablet (10 mg total) by mouth at bedtime. 08/26/23  Yes Johnny Garnette LABOR, MD  pilocarpine  (PILOCAR) 4 % ophthalmic solution Place 1 drop into both eyes 2 (two) times daily. 05/02/14  Yes [provider]  diclofenac  (VOLTAREN ) 75 MG EC tablet Take 1 tablet (75 mg total) by mouth 2 (two) times daily. Patient not taking: No sig reported 05/01/20   Johnny Garnette LABOR, MD  dorzolamide -timolol  (COSOPT ) 22.3-6.8 MG/ML ophthalmic solution Place  1 drop into both eyes 2 (two) times daily. 05/23/14   [provider]  hydrochlorothiazide  (HYDRODIURIL ) 25 MG tablet TAKE ONE TABLET BY MOUTH ONE TIME DAILY 09/15/23   Johnny Garnette LABOR, MD  Netarsudil-Latanoprost  (ROCKLATAN) 0.02-0.005 % SOLN Place 1 drop into both eyes nightly. 09/30/19   [provider]    Current Outpatient Medications  Medication Sig Dispense Refill   amLODipine  (NORVASC ) 10 MG tablet TAKE ONE TABLET BY MOUTH ONE TIME DAILY 90 tablet 1   Ascorbic Acid (VITAMIN C) 1000 MG tablet Take 1,000 mg by mouth daily.     atenolol  (TENORMIN ) 25 MG tablet TAKE ONE TABLET BY MOUTH ONE TIME  DAILY 90 tablet 0   Calcium  Carb-Cholecalciferol (CALCIUM  600+D3 PO) Take 1 tablet by mouth daily.     diazepam  (VALIUM ) 10 MG tablet Take 1 tablet (10 mg total) by mouth at bedtime as needed for sleep. 30 tablet 5   gabapentin  (NEURONTIN ) 100 MG capsule Take 1 capsule (100 mg total) by mouth 3 (three) times daily. 90 capsule 3   levothyroxine  (SYNTHROID ) 50 MCG tablet TAKE ONE TABLET BY MOUTH ONE TIME DAILY 90 tablet 0   Magnesium  400 MG CAPS Take 400 mg by mouth every other day. In the morning     omeprazole  (PRILOSEC) 40 MG capsule TAKE ONE CAPSULE BY MOUTH ONE TIME DAILY 90 capsule 0   oxybutynin  (DITROPAN -XL) 10 MG 24 hr tablet Take 1 tablet (10 mg total) by mouth at bedtime. 30 tablet 5   pilocarpine  (PILOCAR) 4 % ophthalmic solution Place 1 drop into both eyes 2 (two) times daily.     diclofenac  (VOLTAREN ) 75 MG EC tablet Take 1 tablet (75 mg total) by mouth 2 (two) times daily. (Patient not taking: No sig reported) 180 tablet 3   dorzolamide -timolol  (COSOPT ) 22.3-6.8 MG/ML ophthalmic solution Place 1 drop into both eyes 2 (two) times daily.     hydrochlorothiazide  (HYDRODIURIL ) 25 MG tablet TAKE ONE TABLET BY MOUTH ONE TIME DAILY 90 tablet 3   Netarsudil-Latanoprost  (ROCKLATAN) 0.02-0.005 % SOLN Place 1 drop into both eyes nightly.     Current Facility-Administered Medications  Medication Dose Route Frequency Provider Last Rate Last Admin   0.9 %  sodium chloride  infusion  500 mL Intravenous Continuous Haseeb Fiallos, Gordy HERO, MD        Allergies as of 10/02/2023 - Review Complete 10/02/2023  Allergen Reaction Noted   Indomethacin Other (See Comments) 01/02/2007   Trazodone  and nefazodone Other (See Comments) 07/30/2021   Promethazine hcl Other (See Comments) 01/02/2007    Family History  Problem Relation Age of Onset   Pneumonia Mother    Hypertension Mother    Breast cancer Mother    Heart disease Father     Social History   Socioeconomic History   Marital status: Widowed     Spouse name: Not on file   Number of children: 2   Years of education: Not on file   Highest education level: Some college, no degree  Occupational History   Occupation: retired    Comment: Airline pilot  Tobacco Use   Smoking status: Never   Smokeless tobacco: Never  Vaping Use   Vaping status: Never Used  Substance and Sexual Activity   Alcohol use: No   Drug use: No   Sexual activity: Never  Other Topics Concern   Not on file  Social History Narrative   Not on file   Social Drivers of Health   Financial Resource Strain: Low Risk  (  08/22/2023)   Overall Financial Resource Strain (CARDIA)    Difficulty of Paying Living Expenses: Not very hard  Food Insecurity: No Food Insecurity (08/22/2023)   Hunger Vital Sign    Worried About Running Out of Food in the Last Year: Never true    Ran Out of Food in the Last Year: Never true  Transportation Needs: No Transportation Needs (08/22/2023)   PRAPARE - Administrator, Civil Service (Medical): No    Lack of Transportation (Non-Medical): No  Physical Activity: Insufficiently Active (08/22/2023)   Exercise Vital Sign    Days of Exercise per Week: 3 days    Minutes of Exercise per Session: 40 min  Stress: Stress Concern Present (08/22/2023)   [employer] of Occupational Health - Occupational Stress Questionnaire    Feeling of Stress: To some extent  Social Connections: Moderately Integrated (08/22/2023)   Social Connection and Isolation Panel    Frequency of Communication with Friends and Family: Three times a week    Frequency of Social Gatherings with Friends and Family: Twice a week    Attends Religious Services: More than 4 times per year    Active Member of [employer] or Organizations: Yes    Attends Banker Meetings: More than 4 times per year    Marital Status: Widowed  Intimate Partner Violence: Not At Risk (07/14/2023)   Humiliation, Afraid, Rape, and Kick questionnaire    Fear of Current or Ex-Partner:  No    Emotionally Abused: No    Physically Abused: No    Sexually Abused: No    Physical Exam: Vital signs in last 24 hours: @BP  (!) 155/91   Pulse 63   Temp 98.4 F (36.9 C)   Ht 5' (1.524 m)   Wt 137 lb (62.1 kg)   SpO2 94%   BMI 26.76 kg/m  GEN: NAD EYE: Sclerae anicteric ENT: MMM CV: Non-tachycardic Pulm: CTA b/l GI: Soft, NT/ND NEURO:  Alert & Oriented x 3   Gordy Starch, MD [facility]  10/02/2023 2:20 PM

## 2023-10-02 NOTE — Progress Notes (Signed)
 Report to PACU, RN, vss, BBS= Clear.

## 2023-10-02 NOTE — Patient Instructions (Addendum)
-   Resume previous diet. - Continue present medications. Consider Metamucil 2 tablespoons daily to improve regularity. - Await pathology results.   YOU HAD AN ENDOSCOPIC PROCEDURE TODAY AT THE [facility]:   Refer to the procedure report that was given to you for any specific questions about what was found during the examination.  If the procedure report does not answer your questions, please call your gastroenterologist to clarify.  If you requested that your care partner not be given the details of your procedure findings, then the procedure report has been included in a sealed envelope for you to review at your convenience later.  YOU SHOULD EXPECT: Some feelings of bloating in the abdomen. Passage of more gas than usual.  Walking can help get rid of the air that was put into your GI tract during the procedure and reduce the bloating. If you had a lower endoscopy (such as a colonoscopy or flexible sigmoidoscopy) you may notice spotting of blood in your stool or on the toilet paper. If you underwent a bowel prep for your procedure, you may not have a normal bowel movement for a few days.  Please Note:  You might notice some irritation and congestion in your nose or some drainage.  This is from the oxygen used during your procedure.  There is no need for concern and it should clear up in a day or so.  SYMPTOMS TO REPORT IMMEDIATELY:  Following lower endoscopy (colonoscopy or flexible sigmoidoscopy):  Excessive amounts of blood in the stool  Significant tenderness or worsening of abdominal pains  Swelling of the abdomen that is new, acute  Fever of 100F or higher  For urgent or emergent issues, a gastroenterologist can be reached at any hour by calling [phone]. Do not use MyChart messaging for urgent concerns.    DIET:  We do recommend a small meal at first, but then you may proceed to your regular diet.  Drink plenty of fluids but you should avoid alcoholic beverages for  24 hours.  ACTIVITY:  You should plan to take it easy for the rest of today and you should NOT DRIVE or use heavy machinery until tomorrow (because of the sedation medicines used during the test).    FOLLOW UP: Our staff will call the number listed on your records the next business day following your procedure.  We will call around 7:15- 8:00 am to check on you and address any questions or concerns that you may have regarding the information given to you following your procedure. If we do not reach you, we will leave a message.     If any biopsies were taken you will be contacted by phone or by letter within the next 1-3 weeks.  Please call us  at [phone] if you have not heard about the biopsies in 3 weeks.    SIGNATURES/CONFIDENTIALITY: You and/or your care partner have signed paperwork which will be entered into your electronic medical record.  These signatures attest to the fact that that the information above on your After Visit Summary has been reviewed and is understood.  Full responsibility of the confidentiality of this discharge information lies with you and/or your care-partner.

## 2023-10-02 NOTE — Progress Notes (Signed)
 Called to room to assist during endoscopic procedure.  Patient ID and intended procedure confirmed with present staff. Received instructions for my participation in the procedure from the performing physician.

## 2023-10-02 NOTE — Op Note (Signed)
 [facility] Patient Name: Jennifer Green Procedure Date: 10/02/2023 2:20 PM MRN: [id number] Endoscopist: Gordy CHRISTELLA Starch , MD, [id number] Age: 83 Referring MD:  Date of Birth: 05/25/1940 Gender: Female Account #: [id number] Procedure:                Flexible Sigmoidoscopy Indications:              Change in bowel habits, lower abd and groin pain,                            intermittent diarrhea Medicines:                Monitored Anesthesia Care Procedure:                Pre-Anesthesia Assessment:                           - Prior to the procedure, a History and Physical                            was performed, and patient medications and                            allergies were reviewed. The patient's tolerance of                            previous anesthesia was also reviewed. The risks                            and benefits of the procedure and the sedation                            options and risks were discussed with the patient.                            All questions were answered, and informed consent                            was obtained. Prior Anticoagulants: The patient has                            taken no anticoagulant or antiplatelet agents. ASA                            Grade Assessment: III - A patient with severe                            systemic disease. After reviewing the risks and                            benefits, the patient was deemed in satisfactory                            condition to undergo the procedure.  After obtaining informed consent, the scope was                            passed under direct vision. The Olympus Scope                            [id number] was introduced through the anus and                            advanced to the descending colon. The flexible                            sigmoidoscopy was accomplished without difficulty.                            The patient tolerated the procedure  well. The                            quality of the bowel preparation was good. Scope In: 2:30:34 PM Scope Out: 2:37:22 PM Total Procedure Duration: 0 hours 6 minutes 48 seconds  Findings:                 The digital rectal exam was normal.                           The rectum, sigmoid colon and examined descending                            colon appeared normal. Biopsies for histology were                            taken with a cold forceps from the left colon for                            evaluation of microscopic colitis.                           Internal hemorrhoids were found during                            retroflexion. The hemorrhoids were small. Complications:            No immediate complications. Estimated Blood Loss:     Estimated blood loss: none. Impression:               - The rectum, sigmoid colon and descending colon                            are normal. Biopsied.                           - Small internal hemorrhoids. Recommendation:           - Patient has a contact number available for  emergencies. The signs and symptoms of potential                            delayed complications were discussed with the                            patient. Return to normal activities tomorrow.                            Written discharge instructions were provided to the                            patient.                           - Resume previous diet.                           - Continue present medications. Consider Metamucil                            2 tablespoons daily to improve regularity.                           - Await pathology results. Gordy CHRISTELLA Starch, MD 10/02/2023 2:41:25 PM This report has been signed electronically.

## 2023-10-03 ENCOUNTER — Telehealth: Payer: Self-pay | Admitting: Lactation Services

## 2023-10-03 NOTE — Telephone Encounter (Signed)
 No answer -no voice mail set up.

## 2023-10-07 LAB — SURGICAL PATHOLOGY

## 2023-10-09 ENCOUNTER — Ambulatory Visit: Payer: Self-pay | Admitting: Internal Medicine

## 2023-10-17 NOTE — Telephone Encounter (Signed)
 Patient returning call? Please review and advise  Thank you

## 2023-11-15 ENCOUNTER — Other Ambulatory Visit: Payer: Self-pay | Admitting: Family Medicine

## 2023-11-15 DIAGNOSIS — M109 Gout, unspecified: Secondary | ICD-10-CM

## 2023-11-17 ENCOUNTER — Ambulatory Visit: Admitting: Student

## 2023-11-17 ENCOUNTER — Encounter: Payer: Self-pay | Admitting: Student

## 2023-11-17 VITALS — BP 148/80 | HR 63 | Wt 140.0 lb

## 2023-11-17 DIAGNOSIS — Z1331 Encounter for screening for depression: Secondary | ICD-10-CM | POA: Diagnosis not present

## 2023-11-17 DIAGNOSIS — Z7689 Persons encountering health services in other specified circumstances: Secondary | ICD-10-CM

## 2023-11-17 DIAGNOSIS — R1031 Right lower quadrant pain: Secondary | ICD-10-CM

## 2023-11-17 DIAGNOSIS — R9389 Abnormal findings on diagnostic imaging of other specified body structures: Secondary | ICD-10-CM

## 2023-11-19 NOTE — Progress Notes (Signed)
  History:  [name] is a 83 y.o. G2P0 who presents to clinic today for establishing care and follow-up of chronic RLQ abdominal pain. Patient notes that she has developed increased swelling in her upper abdomen that feels like fluid. Patient has been worked up by [facility] medicine and gastroenterology for same concern. Patient reports that she was instructed to follow-up with GYN for further management. Has received CT Abdomen and Pelvic, US  Pelvic (results indeterminate), and MRI Abdomen over the past year. No acute findings. Endometrial thickening noted on CT last year. Unable to visualize uterus via TVUS due to patient discomfort.   The following portions of the patient's history were reviewed and updated as appropriate: allergies, current medications, family history, past medical history, social history, past surgical history and problem list.  Review of Systems:  Review of Systems  Constitutional: Negative.   Gastrointestinal:  Positive for abdominal pain. Negative for vomiting.  Genitourinary:  Positive for frequency and urgency. Negative for dysuria.  Musculoskeletal:  Positive for back pain and joint pain.  Skin: Negative.   Neurological:  Negative for dizziness and headaches.  Psychiatric/Behavioral: Negative.        Objective:  Physical Exam BP (!) 148/80   Pulse 63   Wt 140 lb (63.5 kg)   BMI 27.34 kg/m  Physical Exam Abdominal:     General: There is distension.     Palpations: Abdomen is soft.     Tenderness: There is abdominal tenderness in the right lower quadrant.  Genitourinary:    Comments: Deferred- not indicated Musculoskeletal:        General: Signs of injury present.     Comments: Ambulating with assistive device, back disaligned  Skin:    General: Skin is warm and dry.  Neurological:     Mental Status: She is alert and oriented to person, place, and time.  Psychiatric:        Mood and Affect: Mood normal.        Behavior: Behavior normal.        Labs and Imaging No results found for this or any previous visit (from the past 24 hours).  No results found.  Health Maintenance Due  Topic Date Due   Influenza Vaccine  09/12/2023   COVID-19 Vaccine (4 - 2025-26 season) 10/13/2023    Labs, imaging and previous visits in Epic and Care Everywhere reviewed  Assessment & Plan:  1. Encounter to establish care (Primary) - Welcomed to the practice  2. RLQ abdominal pain - Discussed with patient that current labs and imaging are inconclusive from a Gyn perspective  - May consider follow-up MRI of the pelvis to evaluate lining of uterus (see CT result 12/2022 and Pelvic US ). However, some reassurance provided due to absence of any vaginal bleeding or discharge. Possible differentials are pain from L5 compression fracture, pain from fatty liver disease, uterine hyperplasia, OAB induced pain, or idiopathic. Etiology is unclear. Strongly recommend follow-up with internal medicine and consideration for additional imaging, MRI of pelvis, to completely rule out gynecological cause.     Approximately 30 minutes of total time was spent with this patient on counseling and coordination of care  No follow-ups on file.  Hoyle Garre, NP 11/19/2023 9:43 PM

## 2023-12-02 ENCOUNTER — Other Ambulatory Visit: Payer: Self-pay | Admitting: Family Medicine

## 2023-12-02 DIAGNOSIS — I10 Essential (primary) hypertension: Secondary | ICD-10-CM

## 2023-12-02 DIAGNOSIS — K219 Gastro-esophageal reflux disease without esophagitis: Secondary | ICD-10-CM

## 2023-12-15 ENCOUNTER — Encounter: Payer: Self-pay | Admitting: Radiology

## 2023-12-16 ENCOUNTER — Other Ambulatory Visit: Payer: Self-pay | Admitting: Family Medicine

## 2023-12-17 ENCOUNTER — Telehealth: Payer: Self-pay

## 2023-12-17 MED ORDER — GABAPENTIN 100 MG PO CAPS: 100.0000 mg | ORAL_CAPSULE | Freq: Three times a day (TID) | ORAL | 5 refills | Status: AC

## 2023-12-17 NOTE — Addendum Note (Signed)
 Addended by: JOHNNY SENIOR A on: 12/17/2023 04:35 PM   Modules accepted: Orders

## 2023-12-17 NOTE — Telephone Encounter (Signed)
 Copied from CRM [id number]. Topic: Clinical - Medication Question >> Dec 16, 2023 10:29 AM Burnard DEL wrote: Reason for CRM: Patient would like to have gabapentin  (NEURONTIN ) 100 MG capsule prescription sent to   [id number] - [place], [place] - [address]  Phone: [phone] Fax: [phone]  This is the new pharmacy that the patient is using now.

## 2023-12-17 NOTE — Telephone Encounter (Signed)
 Done

## 2023-12-18 NOTE — Telephone Encounter (Signed)
 Left pt a message to call the office with any questions

## 2023-12-20 ENCOUNTER — Other Ambulatory Visit

## 2024-01-07 ENCOUNTER — Ambulatory Visit
Admission: RE | Admit: 2024-01-07 | Discharge: 2024-01-07 | Disposition: A | Discharge status: Home / Self Care | Source: Ambulatory Visit | Attending: Student | Admitting: Student

## 2024-01-07 DIAGNOSIS — R9389 Abnormal findings on diagnostic imaging of other specified body structures: Secondary | ICD-10-CM

## 2024-01-07 DIAGNOSIS — R1031 Right lower quadrant pain: Secondary | ICD-10-CM

## 2024-01-07 MED ORDER — GADOPICLENOL 0.5 MMOL/ML IV SOLN
6.0000 mL | Freq: Once | INTRAVENOUS | Status: AC | PRN
  Administered 2024-01-07: 6 mL via INTRAVENOUS

## 2024-01-21 ENCOUNTER — Ambulatory Visit: Admitting: Family Medicine

## 2024-01-21 ENCOUNTER — Encounter: Payer: Self-pay | Admitting: Family Medicine

## 2024-01-21 VITALS — BP 126/74 | HR 60 | Temp 98.4°F | Ht 60.0 in | Wt 141.0 lb

## 2024-01-21 DIAGNOSIS — R1031 Right lower quadrant pain: Secondary | ICD-10-CM | POA: Diagnosis not present

## 2024-01-21 DIAGNOSIS — R188 Other ascites: Secondary | ICD-10-CM | POA: Diagnosis not present

## 2024-01-21 DIAGNOSIS — G4700 Insomnia, unspecified: Secondary | ICD-10-CM

## 2024-01-21 MED ORDER — DIAZEPAM 10 MG PO TABS: 10.0000 mg | ORAL_TABLET | Freq: Every evening | ORAL | 5 refills | Status: AC | PRN

## 2024-01-21 MED ORDER — TRAMADOL HCL 50 MG PO TABS: 100.0000 mg | ORAL_TABLET | Freq: Two times a day (BID) | ORAL | 2 refills | Status: AC | PRN

## 2024-01-21 NOTE — Progress Notes (Signed)
° °  Subjective:    Patient ID: Jennifer Green, female    DOB: 12-Jul-1940, 83 y.o.   MRN: [id number]  HPI Here to follow up on abdominal swelling and lower abdominal pain. This began about a year ago, but it has steadily gotten worse. No fever or SOB. Her BM's and urinations are normal. No vaginal bleeding. She saw a NP at [facility] on 11-17-23, and she had had both CT and MRI of her abdomen. The MRI on 01-07-24 showed normal ovaries and 3 small uterine fibroids. However it also showed abnormal thickening of the endometrium. This raised the possibility of a neoplasm, and the next step should be an endometrial biopsy. However she says she never got any feedback about the MRI results, and she has tried several times to talk to the GYN clinic, but she has not been able to get any response. The MRI also showed some steatosis in the liver (similar to what was seen on the CT on 05-07-23) but there was no sign of cirrhosis. She has slowly developed more abdominal pain, and she has gained 13 lbs since March. She is taking Ibuprofen and Tylenol  with no relief.    Review of Systems  Constitutional:  Positive for unexpected weight change.  Respiratory: Negative.    Cardiovascular: Negative.   Gastrointestinal:  Positive for abdominal distention and abdominal pain. Negative for blood in stool, constipation, diarrhea, nausea, rectal pain and vomiting.  Genitourinary: Negative.  Negative for dysuria, hematuria, vaginal bleeding and vaginal discharge.       Objective:   Physical Exam Constitutional:      General: She is not in acute distress. Cardiovascular:     Rate and Rhythm: Normal rate and regular rhythm.     Pulses: Normal pulses.     Heart sounds: Normal heart sounds.  Pulmonary:     Effort: Pulmonary effort is normal.     Breath sounds: Normal breath sounds.  Abdominal:     General: Bowel sounds are normal. There is distension.     Palpations: There is no mass.     Tenderness: There is  abdominal tenderness. There is no right CVA tenderness, left CVA tenderness, guarding or rebound.     Hernia: No hernia is present.     Comments: She is tender in there RLQ   Musculoskeletal:     Right lower leg: No edema.     Left lower leg: No edema.  Neurological:     Mental Status: She is alert.           Assessment & Plan:  She has slowly worsening abdominal pain and swelling. I think the next step would be for her to follow up with GYN and to have an endometrial biopsy asap. We will contact [facility] to get her an appt with them quickly. We will also giver her some Tramadol to use as needed for pain. I personally spent a total of 35 minutes in the care of the patient today including getting/reviewing separately obtained history, performing a medically appropriate exam/evaluation, referring and communicating with other health care professionals, and independently interpreting results.  Garnette Olmsted, MD

## 2024-01-22 ENCOUNTER — Other Ambulatory Visit: Payer: Self-pay | Admitting: Family Medicine

## 2024-01-22 ENCOUNTER — Telehealth: Payer: Self-pay | Admitting: Family Medicine

## 2024-01-22 NOTE — Telephone Encounter (Signed)
 Attempted to reach patient about scheduling an appointment. Left a voicemail message.

## 2024-02-10 ENCOUNTER — Telehealth: Admitting: Nurse Practitioner

## 2024-02-10 DIAGNOSIS — R197 Diarrhea, unspecified: Secondary | ICD-10-CM

## 2024-02-11 NOTE — Progress Notes (Signed)
 Jennifer Green,  The color of your stool is concerning for blood. We would like you to be seen in person.    I feel your condition warrants further evaluation and I recommend that you be seen in a face-to-face visit.   NOTE: There will be NO CHARGE for this E-Visit   If you are having a true medical emergency, please call 911.     For an urgent face to face visit, [facility] has multiple urgent care centers for your convenience.  Click the link below for the full list of locations and hours, walk-in wait times, appointment scheduling options and driving directions:  Urgent Care - [place], [place], [place], [place], [place], [place]  [place]     Your MyChart E-visit questionnaire answers were reviewed by a board certified advanced clinical practitioner to complete your personal care plan based on your specific symptoms.    Thank you for using e-Visits.

## 2024-02-13 ENCOUNTER — Other Ambulatory Visit: Payer: Self-pay | Admitting: Family Medicine

## 2024-02-13 DIAGNOSIS — M109 Gout, unspecified: Secondary | ICD-10-CM

## 2024-02-16 ENCOUNTER — Telehealth: Payer: Self-pay

## 2024-02-16 NOTE — Telephone Encounter (Signed)
 Patient's daughter Shona, left message stating that pt has procedure scheduled with Dr. Fredirick this Thursday and that pt is very anxious.  She's wanting to know if pt will have any local anesthetic or anesthesia to help calm her down.   [place], RN

## 2024-02-17 NOTE — Telephone Encounter (Signed)
 Attempted to return Rhonda's (patient daughter) call.  Left voicemail with office callback number.  Per doctor Fredirick shared decision making at appointment will decide need for EMB and whether procedure would be done in clinic or OR if needed.    [place], RN 02/17/24 1712

## 2024-02-19 ENCOUNTER — Other Ambulatory Visit: Payer: Self-pay

## 2024-02-19 ENCOUNTER — Encounter: Payer: Self-pay | Admitting: Family Medicine

## 2024-02-19 ENCOUNTER — Ambulatory Visit: Payer: Self-pay | Admitting: Family Medicine

## 2024-02-19 VITALS — BP 160/82 | HR 65 | Wt 137.8 lb

## 2024-02-19 DIAGNOSIS — R9389 Abnormal findings on diagnostic imaging of other specified body structures: Secondary | ICD-10-CM | POA: Diagnosis not present

## 2024-02-19 NOTE — Assessment & Plan Note (Addendum)
 This is an incidental finding. Lining is 10 mm and is abnormal, but how long has this been there? She was noted to have fibroids and this could be a benign polyp. She is not obese and does not have other risk factors for endometrial cancer like tamoxifen use.  Shared decision making discussed. We talked through office endometrial sampling (which she was unwilling to do-secondary to pain), surgical sampling with D & C with hysteroscopy and risks of this minor surgery. If we found worse case (endometrial cancer) would we proceed with hysterectomy, which is a bigger surgery and risks of that?  Is this finding causing her pain or swelling in her abdomen and weight gain? I do not think that this is an issue as her uterus is 6x5x4 cm and her ovaries are normal and there is no ascites. Another alternative is to repeat imaging in 3 and 6 months to ensure stability. She will take approximately 2-3 weeks and return with her decision.

## 2024-02-19 NOTE — Progress Notes (Signed)
 "  Subjective:    Patient ID: Jennifer Green is a 84 y.o. female presenting with No chief complaint on file.  on 02/19/2024  HPI: Had some right lower quadrant pain. Had 24 # weight gain and so MRI was done. Showed few fibroids, small uterus, normal ovaries, no ascites, and portion of endometrium with thickening at 10 mm. No bleeding and menopausal x some time.  Review of Systems  Constitutional:  Negative for chills and fever.  Respiratory:  Negative for shortness of breath.   Cardiovascular:  Negative for chest pain.  Gastrointestinal:  Negative for abdominal pain, nausea and vomiting.  Genitourinary:  Negative for dysuria.  Skin:  Negative for rash.      Objective:    BP (!) 160/82   Pulse 65   Wt 137 lb 12.8 oz (62.5 kg)   BMI 26.91 kg/m  Physical Exam Exam conducted with a chaperone present.  Constitutional:      General: She is not in acute distress.    Appearance: She is well-developed.  HENT:     Head: Normocephalic and atraumatic.  Eyes:     General: No scleral icterus. Cardiovascular:     Rate and Rhythm: Normal rate.  Pulmonary:     Effort: Pulmonary effort is normal.  Abdominal:     Palpations: Abdomen is soft.  Musculoskeletal:     Cervical back: Neck supple.  Skin:    General: Skin is warm and dry.  Neurological:     Mental Status: She is alert and oriented to person, place, and time.    MRI 01/06/25 Reproductive:   -- Uterus: Measures 6.6 x 3.2 by 5.9 cm. Three small fibroids are seen in the anterior upper uterine corpus and fundus largest measuring 1.8 cm. Focal thickening of endometrium with mild enhancement is also seen in the uterine corpus measuring up to 10 mm, which may be due to endometrial hyperplasia or carcinoma in a postmenopausal female. Cervix and vagina are unremarkable.   -- Right ovary: Appears normal. No ovarian or adnexal masses identified.   -- Left ovary: Appears normal. No ovarian or adnexal masses identified.    Other: No peritoneal thickening or abnormal free fluid.     Assessment & Plan:   Problem List Items Addressed This Visit       Unprioritized   Thickened endometrium - Primary   This is an incidental finding. Lining is 10 mm and is abnormal, but how long has this been there? She was noted to have fibroids and this could be a benign polyp. She is not obese and does not have other risk factors for endometrial cancer like tamoxifen use.  Shared decision making discussed. We talked through office endometrial sampling (which she was unwilling to do-secondary to pain), surgical sampling with D & C with hysteroscopy and risks of this minor surgery. If we found worse case (endometrial cancer) would we proceed with hysterectomy, which is a bigger surgery and risks of that?  Is this finding causing her pain or swelling in her abdomen and weight gain? I do not think that this is an issue as her uterus is 6x5x4 cm and her ovaries are normal and there is no ascites. Another alternative is to repeat imaging in 3 and 6 months to ensure stability. She will take approximately 2-3 weeks and return with her decision.        Return in about 3 weeks (around 03/11/2024) for a follow-up.  Glenys GORMAN Birk, MD 02/19/2024 9:34 AM   "

## 2024-02-24 ENCOUNTER — Other Ambulatory Visit: Payer: Self-pay | Admitting: Family Medicine

## 2024-02-26 ENCOUNTER — Other Ambulatory Visit: Payer: Self-pay | Admitting: Family Medicine

## 2024-02-26 DIAGNOSIS — I10 Essential (primary) hypertension: Secondary | ICD-10-CM

## 2024-02-26 DIAGNOSIS — K219 Gastro-esophageal reflux disease without esophagitis: Secondary | ICD-10-CM

## 2024-04-05 ENCOUNTER — Ambulatory Visit: Payer: Self-pay | Admitting: Family Medicine

## 2024-07-19 ENCOUNTER — Ambulatory Visit
